# Patient Record
Sex: Female | Born: 1990 | Race: Black or African American | Hispanic: No | Marital: Single | Smoking: Former smoker
Health system: Southern US, Community
[De-identification: ages and names within clinical notes are randomized; demographics above are authoritative.]

## PROBLEM LIST (undated history)

## (undated) ENCOUNTER — Emergency Department (HOSPITAL_BASED_OUTPATIENT_CLINIC_OR_DEPARTMENT_OTHER)
Admission: EM | Discharge status: Against Medical Advice | Payer: Medicaid Other | Attending: Emergency Medicine | Admitting: Emergency Medicine

## (undated) DIAGNOSIS — D649 Anemia, unspecified: Secondary | ICD-10-CM

## (undated) DIAGNOSIS — J40 Bronchitis, not specified as acute or chronic: Secondary | ICD-10-CM

## (undated) DIAGNOSIS — F99 Mental disorder, not otherwise specified: Secondary | ICD-10-CM

## (undated) DIAGNOSIS — J45909 Unspecified asthma, uncomplicated: Secondary | ICD-10-CM

## (undated) DIAGNOSIS — E119 Type 2 diabetes mellitus without complications: Secondary | ICD-10-CM

## (undated) DIAGNOSIS — R569 Unspecified convulsions: Secondary | ICD-10-CM

## (undated) HISTORY — PX: APPENDECTOMY: SHX54

## (undated) HISTORY — DX: Mental disorder, not otherwise specified: F99

## (undated) HISTORY — DX: Unspecified asthma, uncomplicated: J45.909

## (undated) HISTORY — PX: DRAINAGE TUBE REMOVAL (ARMC HX): HXRAD1702

---
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
## 2011-01-07 ENCOUNTER — Encounter (HOSPITAL_BASED_OUTPATIENT_CLINIC_OR_DEPARTMENT_OTHER): Payer: Self-pay | Admitting: Emergency Medicine

## 2011-01-07 ENCOUNTER — Emergency Department (HOSPITAL_BASED_OUTPATIENT_CLINIC_OR_DEPARTMENT_OTHER)
Admission: EM | Admit: 2011-01-07 | Discharge: 2011-01-07 | Disposition: A | Discharge status: Home / Self Care | Payer: Medicaid Other | Attending: Emergency Medicine | Admitting: Emergency Medicine

## 2011-01-07 DIAGNOSIS — Z79899 Other long term (current) drug therapy: Secondary | ICD-10-CM | POA: Insufficient documentation

## 2011-01-07 DIAGNOSIS — M549 Dorsalgia, unspecified: Secondary | ICD-10-CM

## 2011-01-07 DIAGNOSIS — F17200 Nicotine dependence, unspecified, uncomplicated: Secondary | ICD-10-CM | POA: Insufficient documentation

## 2011-01-07 LAB — URINALYSIS, ROUTINE W REFLEX MICROSCOPIC
Glucose, UA: NEGATIVE mg/dL
Ketones, ur: NEGATIVE mg/dL
Leukocytes, UA: NEGATIVE
Nitrite: NEGATIVE
pH: 6.5 (ref 5.0–8.0)

## 2011-01-07 LAB — URINE MICROSCOPIC-ADD ON

## 2011-01-07 LAB — PREGNANCY, URINE: Preg Test, Ur: NEGATIVE

## 2011-01-07 MED ORDER — KETOROLAC TROMETHAMINE 60 MG/2ML IM SOLN
60.0000 mg | Freq: Once | INTRAMUSCULAR | Status: DC
  Filled 2011-01-07: qty 2

## 2011-01-07 NOTE — ED Notes (Signed)
Pt c/o lower back pain

## 2011-01-07 NOTE — ED Provider Notes (Signed)
History     CSN: [id number] Arrival date & time: 01/07/2011  2:50 AM  Chief Complaint  Patient presents with  . Back Pain    HPI  (Consider location/radiation/quality/duration/timing/severity/associated sxs/prior treatment)  Patient is a 20 y.o. female presenting with back pain. The history is provided by the patient.  Back Pain  This is a chronic problem. Pertinent negatives include no chest pain, no fever, no headaches, no abdominal pain and no dysuria.   lower back pain ongoing for the last few months, she has everyday, but tonight seemed to be worse. She denies any trauma. She denies any weakness or numbness to her legs. She denies any dysuria, urgency or frequency. No vaginal discharge or bleeding. No radiation of pain. Quality is burning in nature. Pain seems to be worse with movement or activity. She has not seen her doctor for this. She denies any difficulty with walking. no incontinence of bowel or urine. Severity is mild to moderate  Past Medical History  Diagnosis Date  . Seizure     History reviewed. No pertinent past surgical history.  No family history on file.  History  Substance Use Topics  . Smoking status: Current Everyday Smoker  . Smokeless tobacco: Not on file  . Alcohol Use: Yes    OB History    Grav Para Term Preterm Abortions TAB SAB Ect Mult Living                  Review of Systems  Review of Systems  Constitutional: Negative for fever and chills.  HENT: Negative for neck pain and neck stiffness.   Eyes: Negative for pain.  Respiratory: Negative for shortness of breath.   Cardiovascular: Negative for chest pain.  Gastrointestinal: Negative for abdominal pain.  Genitourinary: Negative for dysuria.  Musculoskeletal: Positive for back pain. Negative for joint swelling and gait problem.  Skin: Negative for rash.  Neurological: Negative for headaches.  All other systems reviewed and are negative.    Allergies  Review of patient's allergies  indicates not on file.  Home Medications   Current Outpatient Rx  Name Route Sig Dispense Refill  . CLONAZEPAM 0.5 MG PO TABS Oral Take 0.5 mg by mouth as needed.      Marland Kitchen DIVALPROEX SODIUM 500 MG PO TB24 Oral Take 500 mg by mouth daily.      Marland Kitchen LAMOTRIGINE 100 MG PO TABS Oral Take 100 mg by mouth daily.        Physical Exam    BP 110/73  Pulse 86  Temp(Src) 98.2 F (36.8 C) (Oral)  Resp 18  SpO2 100%  Physical Exam  Constitutional: She is oriented to person, place, and time. She appears well-developed and well-nourished.  HENT:  Head: Normocephalic and atraumatic.  Eyes: Conjunctivae and EOM are normal. Pupils are equal, round, and reactive to light.  Neck: Normal range of motion and full passive range of motion without pain. Neck supple. No thyromegaly present.       No midline tenderness, step-off or deformity.  Cardiovascular: Normal rate, regular rhythm, S1 normal, S2 normal and intact distal pulses.   Pulmonary/Chest: Effort normal and breath sounds normal.  Abdominal: Soft. Bowel sounds are normal. There is no tenderness. There is no CVA tenderness.  Musculoskeletal: Normal range of motion.       Pain localized to lower parathoracic and upper para lumbar spine. No midline tenderness. No step-offs or deformity. No erythema or warmth. No fluctuants or lesions. Lower extremities without any deficits,  normal and symmetric strengths, sensorium and deep tendon reflexes.  Neurological: She is alert and oriented to person, place, and time. She has normal strength and normal reflexes. No cranial nerve deficit or sensory deficit. She displays a negative Romberg sign. GCS eye subscore is 4. GCS verbal subscore is 5. GCS motor subscore is 6.       Normal Gait  Skin: Skin is warm and dry. No rash noted. No cyanosis. Nails show no clubbing.  Psychiatric: She has a normal mood and affect. Her speech is normal and behavior is normal.    ED Course  Procedures (including critical care  time)  Results for orders placed during the hospital encounter of 01/07/11  PREGNANCY, URINE      Component Value Range   Preg Test, Ur NEGATIVE    URINALYSIS, ROUTINE W REFLEX MICROSCOPIC      Component Value Range   Color, Urine YELLOW  YELLOW    Appearance CLOUDY (*) CLEAR    Specific Gravity, Urine 1.024  1.005 - 1.030    pH 6.5  5.0 - 8.0    Glucose, UA NEGATIVE  NEGATIVE (mg/dL)   Hgb urine dipstick LARGE (*) NEGATIVE    Bilirubin Urine NEGATIVE  NEGATIVE    Ketones, ur NEGATIVE  NEGATIVE (mg/dL)   Protein, ur NEGATIVE  NEGATIVE (mg/dL)   Urobilinogen, UA 1.0  0.0 - 1.0 (mg/dL)   Nitrite NEGATIVE  NEGATIVE    Leukocytes, UA NEGATIVE  NEGATIVE   URINE MICROSCOPIC-ADD ON      Component Value Range   Squamous Epithelial / LPF MANY (*) RARE    WBC, UA 0-2  <3 (WBC/hpf)   RBC / HPF 3-6  <3 (RBC/hpf)   Bacteria, UA FEW (*) RARE    Urine-Other MUCOUS PRESENT      Diagnosis: Musculoskeletal back pain  MDM  Patient with back pain for the last month and no deficits on exam. No findings to suggest need for emergent imaging including emergent MRI. UA obtained and reviewed as above. No UTI symptoms. Her pregnancy test negative. Patient states she has not had her Lamictal tonight and is requesting a pill at this time. Pharmacy at this facility does not carry Lamictal. Patient stable for discharge home to take her medications as prescribed. She states she does have medicines at home. Patient has scheduled followup with her physician this week in clinic. She declines pain medications in the emergency department        Sunnie Nielsen, MD 01/07/11 [phone]

## 2011-01-09 DIAGNOSIS — Z0389 Encounter for observation for other suspected diseases and conditions ruled out: Secondary | ICD-10-CM | POA: Insufficient documentation

## 2011-06-02 ENCOUNTER — Emergency Department (HOSPITAL_COMMUNITY)
Admission: EM | Admit: 2011-06-02 | Discharge: 2011-06-02 | Disposition: A | Discharge status: Home Health | Payer: Medicaid Other | Attending: Emergency Medicine | Admitting: Emergency Medicine

## 2011-06-02 ENCOUNTER — Encounter (HOSPITAL_COMMUNITY): Payer: Self-pay | Admitting: Emergency Medicine

## 2011-06-02 DIAGNOSIS — R51 Headache: Secondary | ICD-10-CM | POA: Insufficient documentation

## 2011-06-02 DIAGNOSIS — F17200 Nicotine dependence, unspecified, uncomplicated: Secondary | ICD-10-CM | POA: Insufficient documentation

## 2011-06-02 DIAGNOSIS — B9689 Other specified bacterial agents as the cause of diseases classified elsewhere: Secondary | ICD-10-CM | POA: Insufficient documentation

## 2011-06-02 DIAGNOSIS — R109 Unspecified abdominal pain: Secondary | ICD-10-CM | POA: Insufficient documentation

## 2011-06-02 DIAGNOSIS — N760 Acute vaginitis: Secondary | ICD-10-CM | POA: Insufficient documentation

## 2011-06-02 DIAGNOSIS — A499 Bacterial infection, unspecified: Secondary | ICD-10-CM | POA: Insufficient documentation

## 2011-06-02 LAB — URINALYSIS, ROUTINE W REFLEX MICROSCOPIC
Glucose, UA: NEGATIVE mg/dL
Specific Gravity, Urine: 1.02 (ref 1.005–1.030)
Urobilinogen, UA: 1 mg/dL (ref 0.0–1.0)

## 2011-06-02 LAB — BASIC METABOLIC PANEL
BUN: 8 mg/dL (ref 6–23)
CO2: 24 mEq/L (ref 19–32)
Calcium: 9.7 mg/dL (ref 8.4–10.5)
Chloride: 103 mEq/L (ref 96–112)
Creatinine, Ser: 0.74 mg/dL (ref 0.50–1.10)
Glucose, Bld: 89 mg/dL (ref 70–99)

## 2011-06-02 LAB — DIFFERENTIAL
Eosinophils Relative: 0 % (ref 0–5)
Lymphocytes Relative: 18 % (ref 12–46)
Lymphs Abs: 1.3 10*3/uL (ref 0.7–4.0)
Monocytes Absolute: 0.4 10*3/uL (ref 0.1–1.0)
Monocytes Relative: 6 % (ref 3–12)
Neutro Abs: 5.7 10*3/uL (ref 1.7–7.7)

## 2011-06-02 LAB — CBC
HCT: 39.6 % (ref 36.0–46.0)
Hemoglobin: 13.2 g/dL (ref 12.0–15.0)
MCV: 81.6 fL (ref 78.0–100.0)
WBC: 7.4 10*3/uL (ref 4.0–10.5)

## 2011-06-02 LAB — WET PREP, GENITAL: Trich, Wet Prep: NONE SEEN

## 2011-06-02 LAB — PREGNANCY, URINE: Preg Test, Ur: NEGATIVE

## 2011-06-02 LAB — URINE MICROSCOPIC-ADD ON

## 2011-06-02 MED ORDER — METRONIDAZOLE 500 MG PO TABS
500.0000 mg | ORAL_TABLET | Freq: Once | ORAL | Status: AC
  Administered 2011-06-02: 500 mg via ORAL
  Filled 2011-06-02: qty 1

## 2011-06-02 MED ORDER — HYDROCODONE-ACETAMINOPHEN 5-325 MG PO TABS: 1.0000 | ORAL_TABLET | ORAL | Status: AC | PRN

## 2011-06-02 MED ORDER — METRONIDAZOLE 500 MG PO TABS: 500.0000 mg | ORAL_TABLET | Freq: Two times a day (BID) | ORAL | Status: AC

## 2011-06-02 NOTE — ED Notes (Signed)
Saline lock #22 catheter removed from the left AC.  No charting found about this.  Catheter was intact.

## 2011-06-02 NOTE — ED Notes (Signed)
DC instructions to patient. Verbalized understanding.  Home with family.

## 2011-06-02 NOTE — ED Notes (Signed)
Pt c/o n/v with abd pain and headache since last night.

## 2011-06-02 NOTE — ED Provider Notes (Cosign Needed)
This chart was scribed for Carleene Cooper III, MD by Wallis Mart. The patient was seen in room APA11/APA11 and the patient's care was started at 5:57 PM.   CSN: [id number]  Arrival date & time 06/02/11  1455   First MD Initiated Contact with Patient 06/02/11 1745      Chief Complaint  Patient presents with  . Emesis  . Headache  . Abdominal Pain  . Nausea    (Consider location/radiation/quality/duration/timing/severity/associated sxs/prior treatment) HPI Capitola Ladson is a 21 y.o. female who presents to the Emergency Department complaining of sudden onset, persistence of constant, gradually worsening, moderate to severe LLQ abdominal pain that radiates to legs.  Pt c/o associated vomiting, nausea, tinnitus, subjective fever, earache, sore throat, nausea.  Pt states vomiting started last night and has been vomiting all day. Pt denies diarrhea. There are no other associated symptoms and no other alleviating or aggravating factors.    Pt has had similar problems before, states she was dx w/ virus.  Pt has a seizure disorder and been out of her medication since October. Pt denies alcohol and drug use, but does smoke cigarettes.    PCP: Floydene Flock  Past Medical History  Diagnosis Date  . Seizure     History reviewed. No pertinent past surgical history.  History reviewed. No pertinent family history.  History  Substance Use Topics  . Smoking status: Current Everyday Smoker  . Smokeless tobacco: Not on file  . Alcohol Use: Yes    OB History    Grav Para Term Preterm Abortions TAB SAB Ect Mult Living                  Review of Systems  10 Systems reviewed and are negative for acute change except as noted in the HPI.  Allergies  Review of patient's allergies indicates no known allergies.  Home Medications   Current Outpatient Rx  Name Route Sig Dispense Refill  . CLONAZEPAM 0.5 MG PO TABS Oral Take 0.5 mg by mouth as needed.      Marland Kitchen DIVALPROEX SODIUM ER 500 MG PO  TB24 Oral Take 500 mg by mouth daily.      Marland Kitchen LAMOTRIGINE 100 MG PO TABS Oral Take 100 mg by mouth daily.        BP 109/69  Pulse 110  Temp(Src) 98.4 F (36.9 C) (Oral)  Resp 22  Ht 5\' 4"  (1.626 m)  Wt 134 lb (60.782 kg)  BMI 23.00 kg/m2  SpO2 98%  Physical Exam  Nursing note and vitals reviewed. Constitutional: She is oriented to person, place, and time. She appears well-developed and well-nourished. No distress.  HENT:  Head: Normocephalic and atraumatic.  Eyes: EOM are normal. Pupils are equal, round, and reactive to light.  Neck: Normal range of motion. Neck supple. No tracheal deviation present.  Cardiovascular: Normal rate, regular rhythm and normal heart sounds.   Pulmonary/Chest: Effort normal and breath sounds normal. No respiratory distress.  Abdominal: Soft. Bowel sounds are normal. She exhibits no distension. There is tenderness.       llq tenderness to palpation  Genitourinary:       vaginal bleeding, mild uterine tenderness on bimanual exam, no uterine mass, specimens obtaned for GC and chlamydia  Musculoskeletal: Normal range of motion. She exhibits no edema.  Neurological: She is alert and oriented to person, place, and time. No sensory deficit.  Skin: Skin is warm and dry.  Psychiatric: She has a normal mood and affect. Her behavior is normal.  ED Course  Procedures (including critical care time) DIAGNOSTIC STUDIES: Oxygen Saturation is 100% on room air, normal by my interpretation.    COORDINATION OF CARE:  7:01 PM: Pelvic exam performed w/ witness in room   Labs Reviewed  URINALYSIS, ROUTINE W REFLEX MICROSCOPIC - Abnormal; Notable for the following:    Color, Urine AMBER (*) BIOCHEMICALS MAY BE AFFECTED BY COLOR   Hgb urine dipstick LARGE (*)    Bilirubin Urine SMALL (*)    Ketones, ur 15 (*)    Protein, ur 30 (*)    All other components within normal limits  WET PREP, GENITAL - Abnormal; Notable for the following:    Clue Cells Wet Prep HPF POC  FEW (*)    WBC, Wet Prep HPF POC FEW (*)    All other components within normal limits  URINE MICROSCOPIC-ADD ON - Abnormal; Notable for the following:    Squamous Epithelial / LPF FEW (*)    Bacteria, UA FEW (*)    All other components within normal limits  PREGNANCY, URINE  CBC  DIFFERENTIAL  BASIC METABOLIC PANEL  GC/CHLAMYDIA PROBE AMP, GENITAL   No results found.   1. Bacterial vaginosis      I personally performed the services described in this documentation, which was scribed in my presence. The recorded information has been reviewed and considered.  Osvaldo Human, M.D.   Carleene Cooper III, MD 06/03/11 1101

## 2011-06-04 LAB — GC/CHLAMYDIA PROBE AMP, GENITAL: GC Probe Amp, Genital: NEGATIVE

## 2012-05-14 ENCOUNTER — Encounter (HOSPITAL_COMMUNITY): Payer: Self-pay | Admitting: Cardiology

## 2012-05-14 ENCOUNTER — Emergency Department (HOSPITAL_COMMUNITY)
Admission: EM | Admit: 2012-05-14 | Discharge: 2012-05-14 | Disposition: A | Discharge status: Home / Self Care | Payer: Medicaid Other | Attending: Emergency Medicine | Admitting: Emergency Medicine

## 2012-05-14 DIAGNOSIS — Z8669 Personal history of other diseases of the nervous system and sense organs: Secondary | ICD-10-CM | POA: Insufficient documentation

## 2012-05-14 DIAGNOSIS — F17200 Nicotine dependence, unspecified, uncomplicated: Secondary | ICD-10-CM | POA: Insufficient documentation

## 2012-05-14 DIAGNOSIS — N898 Other specified noninflammatory disorders of vagina: Secondary | ICD-10-CM | POA: Insufficient documentation

## 2012-05-14 DIAGNOSIS — N939 Abnormal uterine and vaginal bleeding, unspecified: Secondary | ICD-10-CM

## 2012-05-14 DIAGNOSIS — Z3202 Encounter for pregnancy test, result negative: Secondary | ICD-10-CM | POA: Insufficient documentation

## 2012-05-14 LAB — URINE MICROSCOPIC-ADD ON

## 2012-05-14 LAB — URINALYSIS, ROUTINE W REFLEX MICROSCOPIC
Bilirubin Urine: NEGATIVE
Glucose, UA: NEGATIVE mg/dL
Protein, ur: NEGATIVE mg/dL

## 2012-05-14 LAB — POCT PREGNANCY, URINE: Preg Test, Ur: NEGATIVE

## 2012-05-14 NOTE — ED Provider Notes (Signed)
History     CSN: [id number]  Arrival date & time 05/14/12  1426   First MD Initiated Contact with Patient 05/14/12 1514      Chief Complaint  Patient presents with  . Possible Pregnancy    (Consider location/radiation/quality/duration/timing/severity/associated sxs/prior treatment) Patient is a 22 y.o. female presenting with vaginal bleeding. The history is provided by the patient (pt states she has had a small amount of bleeding). No language interpreter was used.  Vaginal Bleeding This is a new problem. The current episode started more than 2 days ago. The problem occurs rarely. The problem has not changed since onset.Pertinent negatives include no chest pain, no abdominal pain and no headaches. Nothing aggravates the symptoms. Nothing relieves the symptoms.    Past Medical History  Diagnosis Date  . Seizure     History reviewed. No pertinent past surgical history.  History reviewed. No pertinent family history.  History  Substance Use Topics  . Smoking status: Current Every Day Smoker  . Smokeless tobacco: Not on file  . Alcohol Use: Yes    OB History    Grav Para Term Preterm Abortions TAB SAB Ect Mult Living                  Review of Systems  Constitutional: Negative for fatigue.  HENT: Negative for congestion, sinus pressure and ear discharge.   Eyes: Negative for discharge.  Respiratory: Negative for cough.   Cardiovascular: Negative for chest pain.  Gastrointestinal: Negative for abdominal pain and diarrhea.  Genitourinary: Positive for vaginal bleeding. Negative for frequency and hematuria.  Musculoskeletal: Negative for back pain.  Skin: Negative for rash.  Neurological: Negative for seizures and headaches.  Hematological: Negative.   Psychiatric/Behavioral: Negative for hallucinations.    Allergies  Review of patient's allergies indicates no known allergies.  Home Medications  No current outpatient prescriptions on file.  BP 108/68  Pulse 89   Temp 98.3 F (36.8 C) (Oral)  Resp 15  SpO2 100%  LMP 03/08/2012  Physical Exam  Constitutional: She is oriented to person, place, and time. She appears well-developed.  HENT:  Head: Normocephalic and atraumatic.  Eyes: Conjunctivae normal and EOM are normal. No scleral icterus.  Neck: Neck supple. No thyromegaly present.  Cardiovascular: Normal rate and regular rhythm.  Exam reveals no gallop and no friction rub.   No murmur heard. Pulmonary/Chest: No stridor. She has no wheezes. She has no rales. She exhibits no tenderness.  Abdominal: She exhibits no distension. There is no tenderness. There is no rebound.  Musculoskeletal: Normal range of motion. She exhibits no edema.  Lymphadenopathy:    She has no cervical adenopathy.  Neurological: She is oriented to person, place, and time. Coordination normal.  Skin: No rash noted. No erythema.  Psychiatric: She has a normal mood and affect. Her behavior is normal.    ED Course  Procedures (including critical care time)   Labs Reviewed  POCT PREGNANCY, URINE  URINALYSIS, ROUTINE W REFLEX MICROSCOPIC   No results found.   1. Vaginal bleeding       MDM          Benny Lennert, MD 05/14/12 [phone]

## 2012-05-14 NOTE — ED Notes (Signed)
Pt presents for evaluation of positive pregnancy test, pt took 2 pregnancy tests last week that were positive and a few days later noticed bright red vaginal spotting.  Pt LMP was in November, denies any abdominal pain or difficulty urinating.

## 2012-05-14 NOTE — ED Notes (Signed)
Pt reports she took 2 pregnancy test about a week ago and was positive. States she became concerned today when she saw some bleeding. Denies any pain.

## 2012-08-17 ENCOUNTER — Emergency Department (HOSPITAL_COMMUNITY)
Admission: EM | Admit: 2012-08-17 | Discharge: 2012-08-17 | Disposition: A | Discharge status: Home / Self Care | Payer: Medicaid Other | Attending: Emergency Medicine | Admitting: Emergency Medicine

## 2012-08-17 ENCOUNTER — Encounter (HOSPITAL_COMMUNITY): Payer: Self-pay | Admitting: Adult Health

## 2012-08-17 DIAGNOSIS — R110 Nausea: Secondary | ICD-10-CM | POA: Insufficient documentation

## 2012-08-17 DIAGNOSIS — Z8669 Personal history of other diseases of the nervous system and sense organs: Secondary | ICD-10-CM | POA: Insufficient documentation

## 2012-08-17 DIAGNOSIS — Z3202 Encounter for pregnancy test, result negative: Secondary | ICD-10-CM | POA: Insufficient documentation

## 2012-08-17 DIAGNOSIS — N912 Amenorrhea, unspecified: Secondary | ICD-10-CM | POA: Insufficient documentation

## 2012-08-17 DIAGNOSIS — F17200 Nicotine dependence, unspecified, uncomplicated: Secondary | ICD-10-CM | POA: Insufficient documentation

## 2012-08-17 LAB — POCT PREGNANCY, URINE: Preg Test, Ur: NEGATIVE

## 2012-08-17 NOTE — ED Provider Notes (Signed)
History     CSN: [id number]  Arrival date & time 08/17/12  1752   First MD Initiated Contact with Patient 08/17/12 1841      Chief Complaint  Patient presents with  . Possible Pregnancy    (Consider location/radiation/quality/duration/timing/severity/associated sxs/prior treatment) HPI Jamie [name] is a 22 y.o. female who presents to ED with complaint of positive pregnancy test at home and wanting confirmation. States no period since January. States normally has regular periods. Also reports feeling nauseated from time to time. States also feels like her abdomen is getting larger and breasts are tender and enlarged. States she is sexually active. Denies any other complaints. States called a gyn but they told her she has to have a medicaid referral. States could not get one today. Pt denies urinary symptoms, no vaginal complaints, no abdominal pain.   Past Medical History  Diagnosis Date  . Seizure     History reviewed. No pertinent past surgical history.  History reviewed. No pertinent family history.  History  Substance Use Topics  . Smoking status: Current Every Day Smoker  . Smokeless tobacco: Not on file  . Alcohol Use: Yes    OB History   Grav Para Term Preterm Abortions TAB SAB Ect Mult Living                  Review of Systems  Constitutional: Negative for fever and chills.  HENT: Negative for neck pain and neck stiffness.   Respiratory: Negative.   Cardiovascular: Negative.   Gastrointestinal: Positive for nausea. Negative for vomiting, abdominal pain, diarrhea and constipation.  Genitourinary: Negative for dysuria, urgency, flank pain, vaginal bleeding, vaginal discharge, vaginal pain and pelvic pain.  Skin: Negative.   Neurological: Negative for weakness and numbness.    Allergies  Review of patient's allergies indicates no known allergies.  Home Medications  No current outpatient prescriptions on file.  BP 112/69  Pulse 89  Temp(Src) 98.3 F  (36.8 C) (Oral)  Resp 16  SpO2 100%  Physical Exam  Nursing note and vitals reviewed. Constitutional: She is oriented to person, place, and time. She appears well-developed and well-nourished. No distress.  Neck: Neck supple.  Cardiovascular: Normal rate, regular rhythm and normal heart sounds.   Pulmonary/Chest: Effort normal and breath sounds normal. No respiratory distress. She has no wheezes. She has no rales.  Abdominal: Soft. Bowel sounds are normal. She exhibits no distension. There is no tenderness. There is no rebound.  Genitourinary:  Breasts normal appearing  Neurological: She is alert and oriented to person, place, and time.  Skin: Skin is warm and dry.    ED Course  Procedures (including critical care time)  Labs Reviewed  POCT PREGNANCY, URINE   No results found.   1. Amenorrhea       MDM  Pregnancy test negative. Pt in no distress. Vs normal. Explained that she could have had false positive at home or false negative here. Either way, re take pregnancy test at home in 1 week. Follow up with GYN for ammenorrhea and if pregnant.    Filed Vitals:   08/17/12 1759  BP: 112/69  Pulse: 89  Temp: 98.3 F (36.8 C)  TempSrc: Oral  Resp: 16  SpO2: 100%        Pilar Westergaard A Mouhamed Glassco, PA-C 08/18/12 0007

## 2012-08-17 NOTE — ED Notes (Addendum)
Pt reports not having period since Jan. Took home pregnancy test and it is positive. Requesting confirmation. Denies abdominal pain, reports breast tenderness. Denies vaginal bleeding. Reports nausea and emesis. Pt c/o intermittent headaches.

## 2012-08-18 NOTE — ED Provider Notes (Signed)
Medical screening examination/treatment/procedure(s) were performed by non-physician practitioner and as supervising physician I was immediately available for consultation/collaboration.  Flint Melter, MD 08/18/12 [phone]

## 2012-12-28 ENCOUNTER — Emergency Department (HOSPITAL_COMMUNITY): Payer: Medicaid Other

## 2012-12-28 ENCOUNTER — Encounter (HOSPITAL_COMMUNITY): Payer: Self-pay | Admitting: *Deleted

## 2012-12-28 ENCOUNTER — Emergency Department (HOSPITAL_COMMUNITY)
Admission: EM | Admit: 2012-12-28 | Discharge: 2012-12-28 | Disposition: A | Discharge status: Home / Self Care | Payer: Medicaid Other | Attending: Emergency Medicine | Admitting: Emergency Medicine

## 2012-12-28 DIAGNOSIS — F17200 Nicotine dependence, unspecified, uncomplicated: Secondary | ICD-10-CM | POA: Insufficient documentation

## 2012-12-28 DIAGNOSIS — Z3202 Encounter for pregnancy test, result negative: Secondary | ICD-10-CM | POA: Insufficient documentation

## 2012-12-28 DIAGNOSIS — Z8669 Personal history of other diseases of the nervous system and sense organs: Secondary | ICD-10-CM | POA: Insufficient documentation

## 2012-12-28 DIAGNOSIS — R112 Nausea with vomiting, unspecified: Secondary | ICD-10-CM | POA: Insufficient documentation

## 2012-12-28 DIAGNOSIS — N852 Hypertrophy of uterus: Secondary | ICD-10-CM | POA: Insufficient documentation

## 2012-12-28 LAB — CBC WITH DIFFERENTIAL/PLATELET
Basophils Absolute: 0 10*3/uL (ref 0.0–0.1)
Basophils Relative: 1 % (ref 0–1)
Eosinophils Absolute: 0.1 10*3/uL (ref 0.0–0.7)
Hemoglobin: 10.5 g/dL — ABNORMAL LOW (ref 12.0–15.0)
MCH: 25 pg — ABNORMAL LOW (ref 26.0–34.0)
MCHC: 32.6 g/dL (ref 30.0–36.0)
Monocytes Relative: 10 % (ref 3–12)
Neutrophils Relative %: 29 % — ABNORMAL LOW (ref 43–77)
RDW: 13.7 % (ref 11.5–15.5)

## 2012-12-28 LAB — BASIC METABOLIC PANEL
BUN: 6 mg/dL (ref 6–23)
Creatinine, Ser: 0.77 mg/dL (ref 0.50–1.10)
GFR calc Af Amer: >90 mL/min (ref >90)
GFR calc non Af Amer: >90 mL/min (ref >90)
Potassium: 3.2 mEq/L — ABNORMAL LOW (ref 3.5–5.1)

## 2012-12-28 LAB — URINALYSIS, ROUTINE W REFLEX MICROSCOPIC
Glucose, UA: NEGATIVE mg/dL
Ketones, ur: 15 mg/dL — AB
Leukocytes, UA: NEGATIVE
Nitrite: NEGATIVE
Protein, ur: NEGATIVE mg/dL
Urobilinogen, UA: 1 mg/dL (ref 0.0–1.0)

## 2012-12-28 LAB — HCG, QUANTITATIVE, PREGNANCY: hCG, Beta Chain, Quant, S: <1 m[IU]/mL (ref <5)

## 2012-12-28 MED ORDER — ONDANSETRON HCL 4 MG PO TABS: 4.0000 mg | ORAL_TABLET | Freq: Four times a day (QID) | ORAL | Status: DC

## 2012-12-28 MED ORDER — ONDANSETRON 4 MG PO TBDP
4.0000 mg | ORAL_TABLET | Freq: Once | ORAL | Status: AC
  Administered 2012-12-28: 4 mg via ORAL
  Filled 2012-12-28: qty 1

## 2012-12-28 NOTE — ED Provider Notes (Signed)
CSN: [id number]     Arrival date & time 12/28/12  1018 History   First MD Initiated Contact with Patient 12/28/12 1113     Chief Complaint  Patient presents with  . Emesis   (Consider location/radiation/quality/duration/timing/severity/associated sxs/prior Treatment) HPI  Jamie [name] is a 22 y.o.female without any significant PMH presents to the ER with complaints of nausea, vomiting and positive pregnancy test. She truly can not remember when her last period may have been. Her mother is unsure as well. She is unable to fit into her clothes now and is having difficulty sleeping. The patients mother says that she has not been keeping any food or fluids down for a week now. Pt denies having any vaginal bleeding or discharge, denies abdominal pains or cramping. No dysuria. She does not feel weak. Her vss.   Past Medical History  Diagnosis Date  . Seizure    History reviewed. No pertinent past surgical history. History reviewed. No pertinent family history. History  Substance Use Topics  . Smoking status: Current Every Day Smoker  . Smokeless tobacco: Not on file  . Alcohol Use: Yes   OB History   Grav Para Term Preterm Abortions TAB SAB Ect Mult Living                 Review of Systems The patient denies anorexia, fever, weight loss,, vision loss, decreased hearing, hoarseness, chest pain, syncope, dyspnea on exertion, peripheral edema, balance deficits, hemoptysis, abdominal pain, melena, hematochezia, severe indigestion/heartburn, hematuria, incontinence, genital sores, muscle weakness, suspicious skin lesions, transient blindness, difficulty walking, depression, unusual weight change, abnormal bleeding, enlarged lymph nodes, angioedema, and breast masses.  Allergies  Review of patient's allergies indicates no known allergies.  Home Medications   Current Outpatient Rx  Name  Route  Sig  Dispense  Refill  . acetaminophen (TYLENOL) 325 MG tablet   Oral   Take 650 mg by  mouth every 6 (six) hours as needed for pain (headache).         . ondansetron (ZOFRAN) 4 MG tablet   Oral   Take 1 tablet (4 mg total) by mouth every 6 (six) hours.   12 tablet   0    BP 111/68  Pulse 71  Temp(Src) 98.7 F (37.1 C) (Oral)  Resp 20  SpO2 100% Physical Exam  Nursing note and vitals reviewed. Constitutional: She appears well-developed and well-nourished. No distress.  HENT:  Head: Normocephalic and atraumatic.  Eyes: Pupils are equal, round, and reactive to light.  Neck: Normal range of motion. Neck supple.  Cardiovascular: Normal rate and regular rhythm.   Pulmonary/Chest: Effort normal.  Abdominal: Soft.  Genitourinary: Uterus is enlarged.  Neurological: She is alert.  Skin: Skin is warm and dry.    ED Course  Procedures (including critical care time) Labs Review Labs Reviewed  URINALYSIS, ROUTINE W REFLEX MICROSCOPIC - Abnormal; Notable for the following:    Color, Urine AMBER (*)    APPearance CLOUDY (*)    Bilirubin Urine SMALL (*)    Ketones, ur 15 (*)    All other components within normal limits  CBC WITH DIFFERENTIAL - Abnormal; Notable for the following:    Hemoglobin 10.5 (*)    HCT 32.2 (*)    MCV 76.7 (*)    MCH 25.0 (*)    Neutrophils Relative % 29 (*)    Neutro Abs 1.4 (*)    Lymphocytes Relative 59 (*)    All other components within normal limits  BASIC METABOLIC PANEL - Abnormal; Notable for the following:    Potassium 3.2 (*)    All other components within normal limits  PREGNANCY, URINE  HCG, QUANTITATIVE, PREGNANCY   Imaging Review US Pelvis Complete  12/28/2012   CLINICAL DATA:  22 year old female with abdominal pain. Patient initially thought to be pregnant, subsequent negative urine pregnancy test.  EXAM: TRANSABDOMINAL ULTRASOUND OF PELVIS  TECHNIQUE: Transabdominal ultrasound examination of the pelvis was performed including evaluation of the uterus, ovaries, adnexal regions, and pelvic cul-de-sac.  COMPARISON:   08/09/2010.  FINDINGS: Uterus:  Normal. 7.8 x 3.5 x 4.8 cm.  Endometrium: Homogeneous up to 6 mm in thickness.  Right ovary: Appears normal with multiple small follicles. 3.7 x 2.9 x 3.5 cm.  Left ovary: Appears normal with multiple small follicles. 3.8 x 2.8 x 3.2 cm.  Other findings:  No free fluid  IMPRESSION: Stable and negative sonographic appearance of the pelvis.   Electronically Signed   By: Augusto Gamble M.D.   On: 12/28/2012 13:08    MDM   1. Nausea and vomiting      Urine pregnancy is negative but [place] of uterus still obtained due to uterus being enlarged. No abnormalities were found.   Blood HCG is zero, and no electrolyte abnormalities. She was given Zofran in the ED and tolerated fluids and solids without vomiting. NO abdominal pain and no vaginal bleeding.   Pt  Can follow-up with PCP or return to the ED as needed.  22 y.o.Jamie [name]'s evaluation in the Emergency Department is complete. It has been determined that no acute conditions requiring further emergency intervention are present at this time. The patient/guardian have been advised of the diagnosis and plan. We have discussed signs and symptoms that warrant return to the ED, such as changes or worsening in symptoms.  Vital signs are stable at discharge. Filed Vitals:   12/28/12 1028  BP: 111/68  Pulse: 71  Temp: 98.7 F (37.1 C)  Resp: 20    Patient/guardian has voiced understanding and agreed to follow-up with the PCP or specialist.    Dorthula Matas, PA-C 12/28/12 1451

## 2012-12-28 NOTE — ED Notes (Addendum)
Pt reports having n/v x 7 days. Took pregnancy test yesterday and it was +. Also having difficulty sleeping. No acute distress noted at triage. Pt has no idea when last period was.

## 2012-12-28 NOTE — ED Provider Notes (Signed)
Medical screening examination/treatment/procedure(s) were performed by non-physician practitioner and as supervising physician I was immediately available for consultation/collaboration.   Niala Stcharles, MD 12/28/12 1632 

## 2014-01-13 ENCOUNTER — Inpatient Hospital Stay (HOSPITAL_COMMUNITY): Payer: Medicaid Other

## 2014-01-13 ENCOUNTER — Encounter (HOSPITAL_COMMUNITY): Payer: Self-pay | Admitting: General Surgery

## 2014-01-13 ENCOUNTER — Inpatient Hospital Stay (HOSPITAL_COMMUNITY)
Admission: AD | Admit: 2014-01-13 | Discharge: 2014-01-17 | DRG: 871 | Disposition: A | Discharge status: Home Health | Payer: Medicaid Other | Source: Other Acute Inpatient Hospital | Attending: General Surgery | Admitting: General Surgery

## 2014-01-13 DIAGNOSIS — J9811 Atelectasis: Secondary | ICD-10-CM | POA: Diagnosis present

## 2014-01-13 DIAGNOSIS — I313 Pericardial effusion (noninflammatory): Secondary | ICD-10-CM | POA: Diagnosis present

## 2014-01-13 DIAGNOSIS — D649 Anemia, unspecified: Secondary | ICD-10-CM | POA: Diagnosis present

## 2014-01-13 DIAGNOSIS — J90 Pleural effusion, not elsewhere classified: Secondary | ICD-10-CM | POA: Diagnosis present

## 2014-01-13 DIAGNOSIS — K353 Acute appendicitis with localized peritonitis: Secondary | ICD-10-CM | POA: Diagnosis present

## 2014-01-13 DIAGNOSIS — R6520 Severe sepsis without septic shock: Secondary | ICD-10-CM | POA: Diagnosis present

## 2014-01-13 DIAGNOSIS — R0902 Hypoxemia: Secondary | ICD-10-CM | POA: Diagnosis present

## 2014-01-13 DIAGNOSIS — A419 Sepsis, unspecified organism: Principal | ICD-10-CM | POA: Diagnosis present

## 2014-01-13 DIAGNOSIS — K37 Unspecified appendicitis: Secondary | ICD-10-CM | POA: Diagnosis present

## 2014-01-13 DIAGNOSIS — G40909 Epilepsy, unspecified, not intractable, without status epilepticus: Secondary | ICD-10-CM | POA: Diagnosis present

## 2014-01-13 DIAGNOSIS — J189 Pneumonia, unspecified organism: Secondary | ICD-10-CM | POA: Diagnosis present

## 2014-01-13 DIAGNOSIS — F17210 Nicotine dependence, cigarettes, uncomplicated: Secondary | ICD-10-CM | POA: Diagnosis present

## 2014-01-13 DIAGNOSIS — K3580 Unspecified acute appendicitis: Secondary | ICD-10-CM

## 2014-01-13 DIAGNOSIS — K651 Peritoneal abscess: Secondary | ICD-10-CM

## 2014-01-13 DIAGNOSIS — K3533 Acute appendicitis with perforation and localized peritonitis, with abscess: Secondary | ICD-10-CM

## 2014-01-13 DIAGNOSIS — F1290 Cannabis use, unspecified, uncomplicated: Secondary | ICD-10-CM | POA: Diagnosis present

## 2014-01-13 LAB — COMPREHENSIVE METABOLIC PANEL
ALT: 6 U/L (ref 0–35)
ANION GAP: 11 (ref 5–15)
AST: 12 U/L (ref 0–37)
Albumin: 1.7 g/dL — ABNORMAL LOW (ref 3.5–5.2)
Alkaline Phosphatase: 101 U/L (ref 39–117)
BUN: 4 mg/dL — AB (ref 6–23)
CO2: 28 mEq/L (ref 19–32)
Calcium: 7.9 mg/dL — ABNORMAL LOW (ref 8.4–10.5)
Chloride: 102 mEq/L (ref 96–112)
Creatinine, Ser: 0.84 mg/dL (ref 0.50–1.10)
GFR calc Af Amer: >90 mL/min (ref >90)
GFR calc non Af Amer: >90 mL/min (ref >90)
Glucose, Bld: 81 mg/dL (ref 70–99)
POTASSIUM: 3.7 meq/L (ref 3.7–5.3)
Sodium: 141 mEq/L (ref 137–147)
Total Bilirubin: 0.3 mg/dL (ref 0.3–1.2)
Total Protein: 6.7 g/dL (ref 6.0–8.3)

## 2014-01-13 LAB — CBC WITH DIFFERENTIAL/PLATELET
BASOS PCT: 0 % (ref 0–1)
Basophils Absolute: 0 10*3/uL (ref 0.0–0.1)
EOS ABS: 0 10*3/uL (ref 0.0–0.7)
Eosinophils Relative: 0 % (ref 0–5)
HCT: 26.5 % — ABNORMAL LOW (ref 36.0–46.0)
Hemoglobin: 8.1 g/dL — ABNORMAL LOW (ref 12.0–15.0)
Lymphocytes Relative: 18 % (ref 12–46)
Lymphs Abs: 3.4 10*3/uL (ref 0.7–4.0)
MCH: 23.1 pg — ABNORMAL LOW (ref 26.0–34.0)
MCHC: 30.6 g/dL (ref 30.0–36.0)
MCV: 75.5 fL — ABNORMAL LOW (ref 78.0–100.0)
MONO ABS: 0.9 10*3/uL (ref 0.1–1.0)
Monocytes Relative: 5 % (ref 3–12)
Neutro Abs: 14.6 10*3/uL — ABNORMAL HIGH (ref 1.7–7.7)
Neutrophils Relative %: 77 % (ref 43–77)
PLATELETS: 435 10*3/uL — AB (ref 150–400)
RBC: 3.51 MIL/uL — ABNORMAL LOW (ref 3.87–5.11)
RDW: 16.7 % — ABNORMAL HIGH (ref 11.5–15.5)
WBC: 18.9 10*3/uL — ABNORMAL HIGH (ref 4.0–10.5)

## 2014-01-13 LAB — GLUCOSE, CAPILLARY: Glucose-Capillary: 87 mg/dL (ref 70–99)

## 2014-01-13 LAB — MRSA PCR SCREENING: MRSA BY PCR: NEGATIVE

## 2014-01-13 LAB — LACTIC ACID, PLASMA: Lactic Acid, Venous: 0.8 mmol/L (ref 0.5–2.2)

## 2014-01-13 MED ORDER — CETYLPYRIDINIUM CHLORIDE 0.05 % MT LIQD
7.0000 mL | Freq: Two times a day (BID) | OROMUCOSAL | Status: DC
  Administered 2014-01-15 – 2014-01-16 (×2): 7 mL via OROMUCOSAL

## 2014-01-13 MED ORDER — IOHEXOL 300 MG/ML  SOLN
80.0000 mL | Freq: Once | INTRAMUSCULAR | Status: AC | PRN
  Administered 2014-01-13: 80 mL via INTRAVENOUS

## 2014-01-13 MED ORDER — HEPARIN SODIUM (PORCINE) 5000 UNIT/ML IJ SOLN
5000.0000 [IU] | Freq: Three times a day (TID) | INTRAMUSCULAR | Status: DC
Start: 2014-01-13 — End: 2014-01-13

## 2014-01-13 MED ORDER — PIPERACILLIN-TAZOBACTAM 3.375 G IVPB
3.3750 g | Freq: Three times a day (TID) | INTRAVENOUS | Status: DC
  Administered 2014-01-13 – 2014-01-17 (×11): 3.375 g via INTRAVENOUS
  Filled 2014-01-13 (×13): qty 50

## 2014-01-13 MED ORDER — FENTANYL CITRATE 0.05 MG/ML IJ SOLN
25.0000 ug | INTRAMUSCULAR | Status: DC | PRN
  Administered 2014-01-14 – 2014-01-15 (×6): 100 ug via INTRAVENOUS
  Administered 2014-01-15 (×4): 50 ug via INTRAVENOUS
  Administered 2014-01-15: 100 ug via INTRAVENOUS
  Administered 2014-01-16: 75 ug via INTRAVENOUS
  Administered 2014-01-17: 25 ug via INTRAVENOUS
  Filled 2014-01-13 (×13): qty 2

## 2014-01-13 MED ORDER — KCL IN DEXTROSE-NACL 40-5-0.45 MEQ/L-%-% IV SOLN
INTRAVENOUS | Status: DC
  Administered 2014-01-13 – 2014-01-14 (×2): 100 mL/h via INTRAVENOUS
  Administered 2014-01-14 – 2014-01-17 (×3): via INTRAVENOUS
  Filled 2014-01-13 (×10): qty 1000

## 2014-01-13 MED ORDER — ENOXAPARIN SODIUM 40 MG/0.4ML ~~LOC~~ SOLN
40.0000 mg | SUBCUTANEOUS | Status: DC
  Administered 2014-01-13 – 2014-01-15 (×3): 40 mg via SUBCUTANEOUS
  Filled 2014-01-13 (×5): qty 0.4

## 2014-01-13 MED ORDER — ASPIRIN 300 MG RE SUPP: 300.0000 mg | RECTAL | Status: DC

## 2014-01-13 MED ORDER — WHITE PETROLATUM GEL
Status: AC
  Administered 2014-01-13: 20:00:00
  Filled 2014-01-13: qty 5

## 2014-01-13 MED ORDER — ASPIRIN 81 MG PO CHEW: 324.0000 mg | CHEWABLE_TABLET | ORAL | Status: DC

## 2014-01-13 MED ORDER — SODIUM CHLORIDE 0.9 % IV SOLN
250.0000 mL | INTRAVENOUS | Status: DC | PRN
Start: 2014-01-13 — End: 2014-01-17

## 2014-01-13 NOTE — Progress Notes (Addendum)
ANTIBIOTIC CONSULT NOTE - INITIAL  Pharmacy Consult for Zosyn Indication: RLQ abscess, severe sepsis  No Known Allergies  Patient Measurements: Height: 5' 0.5" (153.7 cm) Weight: 146 lb 13.2 oz (66.6 kg) IBW/kg (Calculated) : 46.65   Vital Signs: Temp: 99.5 F (37.5 C) (10/02 1600) Temp Source: Oral (10/02 1600) BP: 108/76 mmHg (10/02 1900) Pulse Rate: 103 (10/02 1900) Intake/Output from previous day:   Intake/Output from this shift:    Labs: No results found for this basename: WBC, HGB, PLT, LABCREA, CREATININE,  in the last 72 hours Estimated Creatinine Clearance: 95.3 ml/min (by C-G formula based on Cr of 0.77). No results found for this basename: VANCOTROUGH, Corlis Leak, VANCORANDOM, [place], GENTPEAK, GENTRANDOM, TOBRATROUGH, TOBRAPEAK, TOBRARND, AMIKACINPEAK, AMIKACINTROU, AMIKACIN,  in the last 72 hours   Microbiology: Recent Results (from the past 720 hour(s))  MRSA PCR SCREENING     Status: None   Collection Time    01/13/14  4:03 PM      Result Value Ref Range Status   MRSA by PCR NEGATIVE  NEGATIVE Final   Comment:            The GeneXpert MRSA Assay (FDA     approved for NASAL specimens     only), is one component of a     comprehensive MRSA colonization     surveillance program. It is not     intended to diagnose MRSA     infection nor to guide or     monitor treatment for     MRSA infections.    Medical History: Past Medical History  Diagnosis Date  . Seizure   5 wks s/p C section   Medications:  Scheduled:  . antiseptic oral rinse  7 mL Mouth Rinse BID  . enoxaparin (LOVENOX) injection  40 mg Subcutaneous Q24H  . piperacillin-tazobactam (ZOSYN)  IV  3.375 g Intravenous 3 times per day  . white petrolatum       Assessment: 23 y.o female,  5 wks s/p C section, transferred from [place] with persistent SIRS, abdominal pain due to RLQ abscess.  SCR <1 on 9/29/ 15.  Admission labs are pending. CCM noted that no renal issues were  identified.  Patient was treated with Zosyn 3.375 g IV q8h since admission 01/10/14  at [facility]  Vancomycin and levofloxacin were also added. The patient was transferred to [facility] for persistent fever and abdominal pain.  CCM notes that patient is to continue on Zosyn and to reculture abscess drainage.    Plan:  Continue Zosyn 3.375 g IV q8hr (infuse each dose over 4 hours).  Follow up clinical status, renal function, culture results daily.  Admission labs are still pending- follow up and adjust Zosyn dose if needed.  Nicole Cella, RPh Clinical Pharmacist Pager: [phone] 01/13/2014,9:57 PM

## 2014-01-13 NOTE — Consult Note (Signed)
Well formed abscess likely due to perforated appendicitis. Percutaneous drain is not functional. I have discussed with Dr. Reesa Chew from interventional radiology. We'll proceed with CT scan tonight with intravenous contrast and plan for upsizing drain in the a.m. Plan was discussed in detail with the patient's parents and with Dr. Alva Garnet. Patient examined and I agree with the assessment and plan  Georganna Skeans, MD, MPH, FACS Trauma: [phone] General Surgery: [phone]  01/13/2014 5:14 PM

## 2014-01-13 NOTE — H&P (Addendum)
PULMONARY / CRITICAL CARE MEDICINE   Name: Jamie [name] MRN: [id number] DOB: 16-Jun-1990    ADMISSION DATE:  01/13/2014  REFERRING MD :  Raphael Gibney [facility])  CHIEF COMPLAINT:  Peri-appendiceal abscess  INITIAL PRESENTATION:  23 F 5 wks s/p C section, transferred from [place] with persistent SIRS, abdominal pain due to RLQ abscess. Initially admitted 9/29 to [facility] with RLQ pain and CT revealed RLQ abscess felt to be secondary to ruptured appendicitis and with appearance of being well walled-off. A percutaneous drain was placed with minimal purulent drainage. Gram's stain of drainage reportedly revealed GPCs, NOS. Pt has been treated with pip-tazo since admission to [place] was added on the basis of the Gram's stain. A CTA chest was performed during that hospitalization revealing bibasilar atx vs infiltrates, no PE. Levofloxacin was added on basis of these findings. Transferred to [facility] for persistent fever and abdominal pain.   STUDIES:  10/02 CTAP:   SIGNIFICANT EVENTS: 10/02 CCS consult 10/02 CT guided percutaneous drainage requested of IR   HISTORY OF PRESENT ILLNESS:  As above. Transferred on [place] O2 with stable BP. C/O abdominal pain only  PAST MEDICAL HISTORY :   has a past medical history of Seizure.  has no past surgical history on file. Prior to Admission medications   Medication Sig Start Date End Date Taking? Authorizing Provider  acetaminophen (TYLENOL) 325 MG tablet Take 650 mg by mouth every 6 (six) hours as needed for pain (headache).    Historical Provider, MD  ondansetron (ZOFRAN) 4 MG tablet Take 1 tablet (4 mg total) by mouth every 6 (six) hours. 12/28/12   Linus Mako, PA-C   No Known Allergies  FAMILY HISTORY:  has no family status information on file.  SOCIAL HISTORY:  reports that she has been smoking.  She does not have any smokeless tobacco history on file. She reports that she drinks alcohol. She reports that she uses illicit  drugs (Marijuana).  REVIEW OF SYSTEMS:  As per HPI  SUBJECTIVE:   VITAL SIGNS: Temp:  [99.5 F (37.5 C)] 99.5 F (37.5 C) (10/02 1600) Pulse Rate:  [111] 111 (10/02 1600) Resp:  [21] 21 (10/02 1600) BP: (105)/(61) 105/61 mmHg (10/02 1600) SpO2:  [100 %] 100 % (10/02 1600) Weight:  [66.6 kg (146 lb 13.2 oz)] 66.6 kg (146 lb 13.2 oz) (10/02 1600) HEMODYNAMICS:   VENTILATOR SETTINGS:   INTAKE / OUTPUT:  Intake/Output Summary (Last 24 hours) at 01/13/14 1739 Last data filed at 01/13/14 1600  Gross per 24 hour  Intake    100 ml  Output      0 ml  Net    100 ml    PHYSICAL EXAMINATION: General: No overt distress Neuro:  Cognition, CNs, motor, sens, DTRs all normal HEENT: NCAT, EOMI, PERRL Cardiovascular: slightly tachy, no M Lungs: Dimished in bases with mild splinting, no adventitious sounds Abdomen: distended, marked RLQ tenderness Ext: wam, no edema Skin: no lesions  LABS:  CBC No results found for this basename: WBC, HGB, HCT, PLT,  in the last 168 hours Coag's No results found for this basename: APTT, INR,  in the last 168 hours BMET No results found for this basename: NA, K, CL, CO2, BUN, CREATININE, GLUCOSE,  in the last 168 hours Electrolytes No results found for this basename: CALCIUM, MG, PHOS,  in the last 168 hours Sepsis Markers No results found for this basename: LATICACIDVEN, PROCALCITON, O2SATVEN,  in the last 168 hours ABG No results found  for this basename: PHART, PCO2ART, PO2ART,  in the last 168 hours Liver Enzymes No results found for this basename: AST, ALT, ALKPHOS, BILITOT, ALBUMIN,  in the last 168 hours Cardiac Enzymes No results found for this basename: TROPONINI, PROBNP,  in the last 168 hours Glucose  Recent Labs Lab 01/13/14 1606  GLUCAP 87    Imaging No results found.   ASSESSMENT / PLAN:  PULMONARY A: Mild hypoxemia P:   [place] O2 to maintain SpO2 > 92%  CARDIOVASCULAR A:  Mild sinus tachycardia due to SIRS P:   Monitor treat underlying causes  RENAL A:   No issues identified P:   Monitor BMET intermittently Monitor I/Os Correct electrolytes as indicated Maintenance IVFs while NPO  GASTROINTESTINAL A:   RLQ abscess P:   CCS following IR to replace or upsize drain  HEMATOLOGIC A:   No issues identified P:  DVT px: LMWH Monitor CBC intermittently Transfuse per usual ICU guidelines  INFECTIOUS A:   RLQ abscess Severe sepsis P:   Pip-tazo 9/29 >>  Reculture abscess drainage   ENDOCRINE A:   No issues P:   Monitor glu on chem panels SSI as needed for glu > 180  NEUROLOGIC A:   Abdominal pain Hx of seiaure d/o - no chronic meds Hx of bipolar d/o - no chronic meds P:   RASS goal: 0 Monitor   Family updated:  Father and mother @ bedside during history and exam. Plan explained to pt and her parents  Interdisciplinary Family Meeting v Palliative Care Meeting:    TODAY'S SUMMARY:   I have personally obtained a history, examined the patient, evaluated laboratory and imaging results, formulated the assessment and plan and placed orders. CRITICAL CARE: The patient is critically ill with multiple organ systems failure and requires high complexity decision making for assessment and support, frequent evaluation and titration of therapies, application of advanced monitoring technologies and extensive interpretation of multiple databases. Critical Care Time devoted to patient care services described in this note is  minutes.   Merton Border, MD ; [facility] [phone].  After 5:30 PM or weekends, call [phone]  Pulmonary and [place] Pager: [phone]  01/13/2014, 5:39 PM

## 2014-01-13 NOTE — Consult Note (Signed)
Jamie [name] 01/29/1991  [id number].   Primary Care MD: unknown Requesting MD: Dr. Merton Border Chief Complaint/Reason for Consult: acute perforated appendicitis HPI: This is a 23 yo black female who has a h/o bipolar disorder, seizure disorder, and some mental health issues secondary to her biologic mom doing drugs and ETOH while pregnant.  The patient is 5 weeks post partum from a c-section.  She started her period last week and also began having abdominal pain.  She thought he pain was secondary to her period.  It was on the right side.  She denies any fevers at home.  She denies nausea, vomiting.  She finally went to [facility] on Tuesday where she had a CT scan that revealed acute perforated appendicitis with a large well developed abscess.  She was admitted and started on IV abx therapy.  She had a percutaneous drain placed on Wednesday, but with minimal output since placement.  She has continued to have abdominal pain, but is improved.  She has been tachycardic with a small pericardial effusion.  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  She was also felt to possibly have bilateral pneumonia on CT scan.  She is currently on Vanc, Zosyn, and Levaquin.  Her cultures have showed gram + cocci.  Due to persistent tachycardia, fevers, and overall medical problems, [facility].  We have been asked to see her for further evaluation and management.  ROS: Please see HPI, otherwise negative.  No family history on file.  Past Medical History  Diagnosis Date  . Seizure     History reviewed. No pertinent past surgical history.  Social History:  reports that she has been smoking.  She does not have any smokeless tobacco history on file. She reports that she drinks alcohol. She reports that she uses illicit drugs (Marijuana).  Allergies: No Known Allergies  Medications Prior to  Admission  Medication Sig Dispense Refill  . acetaminophen (TYLENOL) 325 MG tablet Take 650 mg by mouth every 6 (six) hours as needed for pain (headache).      . ondansetron (ZOFRAN) 4 MG tablet Take 1 tablet (4 mg total) by mouth every 6 (six) hours.  12 tablet  0    Blood pressure 105/61, pulse 111, temperature 99.5 F (37.5 C), temperature source Oral, resp. rate 21, height 5' 0.5" (1.537 m), weight 146 lb 13.2 oz (66.6 kg), SpO2 100.00%. Physical Exam: General: pleasant, WD, WN black female who is laying in bed in NAD HEENT: head is normocephalic, atraumatic.  Sclera are noninjected.  PERRL.  Ears and nose without any masses or lesions.  Mouth is pink and moist Heart: regular rhythm, but mildly tachy.  Normal s1,s2. No obvious murmurs, gallops, or rubs noted.  Palpable radial and pedal pulses bilaterally Lungs: CTAB, but decreased breath sounds as bases, no wheezes, rhonchi, or rales noted.  Respiratory effort nonlabored Abd: soft, very tender in RLQ around her drain, some firmness to her RLQ as well, mild distention, +BS, no masses, hernias, or organomegaly, pfannenstiel scar noted.  Drain with minimal output, purulent appearing (maybe 10cc at max over 48hrs)  MS: all 4 extremities are symmetrical with no cyanosis, clubbing, or edema. Skin: warm and dry with no masses, lesions, or rashes Psych: A&Ox3 with a flat affect.    Results for orders placed during the hospital encounter of 01/13/14 (from the past 48 hour(s))  GLUCOSE, CAPILLARY     Status: None   Collection Time    01/13/14  4:06 PM      Result Value Ref Range   Glucose-Capillary 87  70 - 99 mg/dL   No results found.     Assessment/Plan 1. Acute perforated appendicitis with large intra-abdominal abscess, s/p Perc drain on 01-11-14 2. Bipolar disorder 3. Seizure disorder  Plan: 1. The patient's drain does not appear to be functioning well right now as she has only had about 10cc our in about 48 hours since drain was  placed.  We will consult IR to evaluate the drain for possible repositioning vs upsizing of the drain to get it work better.  If this will work better, then hopefully we can continue to treat the patient conservatively, however, if this does not help, then she will likely need a right colectomy given the amount of inflammation and infection in her RLQ.  Continue IV abx therapy.  Cont NPO status for now.  We will follow along.  Thank you for this consultation.  Sindi Beckworth E 01/13/2014, 4:37 PM Pager: [phone]

## 2014-01-14 DIAGNOSIS — K3580 Unspecified acute appendicitis: Secondary | ICD-10-CM

## 2014-01-14 LAB — BASIC METABOLIC PANEL
Anion gap: 9 (ref 5–15)
BUN: 4 mg/dL — ABNORMAL LOW (ref 6–23)
CHLORIDE: 104 meq/L (ref 96–112)
CO2: 30 mEq/L (ref 19–32)
CREATININE: 0.95 mg/dL (ref 0.50–1.10)
Calcium: 8.2 mg/dL — ABNORMAL LOW (ref 8.4–10.5)
GFR, EST NON AFRICAN AMERICAN: 84 mL/min — AB (ref >90)
Glucose, Bld: 92 mg/dL (ref 70–99)
Potassium: 4 mEq/L (ref 3.7–5.3)
Sodium: 143 mEq/L (ref 137–147)

## 2014-01-14 LAB — CBC
HCT: 26.8 % — ABNORMAL LOW (ref 36.0–46.0)
Hemoglobin: 8 g/dL — ABNORMAL LOW (ref 12.0–15.0)
MCH: 22.5 pg — ABNORMAL LOW (ref 26.0–34.0)
MCHC: 29.9 g/dL — AB (ref 30.0–36.0)
MCV: 75.5 fL — AB (ref 78.0–100.0)
Platelets: 410 10*3/uL — ABNORMAL HIGH (ref 150–400)
RBC: 3.55 MIL/uL — ABNORMAL LOW (ref 3.87–5.11)
RDW: 16.5 % — AB (ref 11.5–15.5)
WBC: 15.1 10*3/uL — ABNORMAL HIGH (ref 4.0–10.5)

## 2014-01-14 LAB — PHOSPHORUS: Phosphorus: 3.9 mg/dL (ref 2.3–4.6)

## 2014-01-14 LAB — MAGNESIUM: Magnesium: 1.3 mg/dL — ABNORMAL LOW (ref 1.5–2.5)

## 2014-01-14 MED ORDER — ONDANSETRON HCL 4 MG/2ML IJ SOLN
4.0000 mg | Freq: Four times a day (QID) | INTRAMUSCULAR | Status: DC | PRN
  Administered 2014-01-14 – 2014-01-16 (×6): 4 mg via INTRAVENOUS
  Filled 2014-01-14 (×5): qty 2

## 2014-01-14 MED ORDER — ONDANSETRON HCL 4 MG/2ML IJ SOLN
INTRAMUSCULAR | Status: AC
  Administered 2014-01-14: 4 mg via INTRAVENOUS
  Filled 2014-01-14: qty 2

## 2014-01-14 NOTE — Progress Notes (Signed)
PULMONARY / CRITICAL CARE MEDICINE   Name: Jamie [name] MRN: [id number] DOB: [birth year]    ADMISSION DATE:  01/13/2014  REFERRING MD :  Raphael Gibney [facility])  CHIEF COMPLAINT:  Peri-appendiceal abscess  INITIAL PRESENTATION:  23 F 5 wks s/p C section, transferred from [place] with persistent SIRS, abdominal pain due to RLQ abscess. Initially admitted 9/29 to [facility] with RLQ pain and CT revealed RLQ abscess felt to be secondary to ruptured appendicitis and with appearance of being well walled-off. A percutaneous drain was placed with minimal purulent drainage. Gram's stain of drainage reportedly revealed GPCs, NOS. Pt has been treated with pip-tazo since admission to [place] was added on the basis of the Gram's stain. A CTA chest was performed during that hospitalization revealing bibasilar atx vs infiltrates, no PE. Levofloxacin was added on basis of these findings. Transferred to [facility] for persistent fever and abdominal pain.   STUDIES:  10/02 CTAP >> RLQ abscess 6.9 cm with pig tail catheter in good position, b/l ATX and effusions  SIGNIFICANT EVENTS: 10/02 CCS consult, CT guided percutaneous drainage requested of IR  SUBJECTIVE:  Feels sleepy.  Denies chest pain, dyspnea, nausea.  Abdominal pain better  VITAL SIGNS: Temp:  [98.8 F (37.1 C)-100.4 F (38 C)] 98.8 F (37.1 C) (10/03 0400) Pulse Rate:  [85-114] 91 (10/03 0600) Resp:  [11-21] 19 (10/03 0600) BP: (93-114)/(54-78) 111/64 mmHg (10/03 0600) SpO2:  [94 %-100 %] 100 % (10/03 0600) Weight:  [146 lb 13.2 oz (66.6 kg)] 146 lb 13.2 oz (66.6 kg) (10/02 1600) INTAKE / OUTPUT:  Intake/Output Summary (Last 24 hours) at 01/14/14 0720 Last data filed at 01/14/14 0630  Gross per 24 hour  Intake 1362.5 ml  Output   1250 ml  Net  112.5 ml    PHYSICAL EXAMINATION: General: no distress Neuro: normal strength HEENT: no sinus tenderness Cardiovascular: regular, no murmur Lungs: no wheeze Abdomen: soft,  mild tenderness Ext: no edema Skin: no rashes  LABS:  CBC  Recent Labs Lab 01/13/14 2157 01/14/14 0256  WBC 18.9* 15.1*  HGB 8.1* 8.0*  HCT 26.5* 26.8*  PLT 435* 410*   BMET  Recent Labs Lab 01/13/14 2157 01/14/14 0256  NA 141 143  K 3.7 4.0  CL 102 104  CO2 28 30  BUN 4* 4*  CREATININE 0.84 0.95  GLUCOSE 81 92   Electrolytes  Recent Labs Lab 01/13/14 2157 01/14/14 0256  CALCIUM 7.9* 8.2*  MG  --  1.3*  PHOS  --  3.9   Sepsis Markers  Recent Labs Lab 01/13/14 2157  LATICACIDVEN 0.8   Liver Enzymes  Recent Labs Lab 01/13/14 2157  AST 12  ALT 6  ALKPHOS 101  BILITOT 0.3  ALBUMIN 1.7*   Glucose  Recent Labs Lab 01/13/14 1606  GLUCAP 87    Imaging Ct Abdomen Pelvis W Contrast  01/13/2014   CLINICAL DATA:  Recent history of ruptured appendicitis with abscess and subsequent placement of pigtail drainage catheter.  EXAM: CT ABDOMEN AND PELVIS WITH CONTRAST  TECHNIQUE: Multidetector CT imaging of the abdomen and pelvis was performed using the standard protocol following bolus administration of intravenous contrast.  CONTRAST:  74mL OMNIPAQUE IOHEXOL 300 MG/ML  SOLN  COMPARISON:  01/10/2014 and 01/11/2014  FINDINGS: Lung bases demonstrate worsening moderate bibasilar atelectasis and minimal lingular atelectasis with tiny amount of bilateral pleural fluid.  Abdominal images demonstrate the liver, spleen, pancreas, gallbladder and adrenal glands to be within normal. Kidneys normal in size without  hydronephrosis. There are changes bilaterally of mild peripheral cortical calcification suggesting nephrocalcinosis which is a new finding.  There is evidence of patient's pigtail drainage catheter within the known right lower quadrant abscess. The catheter appears in adequate position within the abscess. The abscess is unchanged in size measuring 6.9 x 8.2 cm. There is no free peritoneal air. No changes several small mesenteric lymph nodes in the right lower  quadrant. There is slight worsening of a fluid over the right flank subcutaneous tissues.  Pelvic images again demonstrate a mild amount of free fluid without significant change. Remainder the exam is unchanged.  IMPRESSION: No significant change in patient's right lower quadrant abscess measuring 6.9 x 0.2 cm as the pigtail drainage catheter appears in adequate position within the abscess cavity. Stable mild associated free fluid in the lower abdomen/pelvis.  Worsening bibasilar atelectasis with tiny amount of bilateral pleural fluid.  New finding of mild bilateral nephrocalcinosis.  Worsening subcutaneous fluid over the right flank.   Electronically Signed   By: Marin Olp M.D.   On: 01/13/2014 21:36   Dg Chest Port 1 View  01/13/2014   CLINICAL DATA:  Shortness of breath.  Tobacco use.  EXAM: PORTABLE CHEST - 1 VIEW  COMPARISON:  01/13/2014 at 912 00 a.m.  FINDINGS: Low lung volumes are present, causing crowding of the pulmonary vasculature. Stably enlarged cardiopericardial silhouette. Stable scarring or atelectasis along the minor fissure.  Slightly improved airspace opacity at the right lung base. Stable indistinct left lower lobe airspace opacity.  IMPRESSION: 1. Mildly improved aeration at the right lung base, although the airspace opacity in this vicinity has not completely cleared. 2. Low lung volumes are present, causing crowding of the pulmonary vasculature. 3. Enlarged cardiopericardial silhouette.   Electronically Signed   By: Sherryl Barters M.D.   On: 01/13/2014 17:01     ASSESSMENT / PLAN:  Acute perforated appendicitis with intra-abdominal abscess s/p drain 9/30 with re-positioning 10/2. Plan: Therapy per CCS, IR NPO Day 5 of zosyn  Sepsis 2nd to above. Plan: Continue IV fluids  Atelectasis with small b/l pleural effusions. Plan: F/u CXR intermittently Bronchial hygiene  Hx of Seizures >> has been off lamictal since her recent pregnancy. Plan: Monitor off  AED's  Anemia. Plan: F/u CBC Transfuse for Hb < 7 of bleeding  Transfer SDU 10/03.   Chesley Mires, MD [facility] Pulmonary/Critical Care 01/14/2014, 7:39 AM Pager:  [phone] After 3pm call: [phone]

## 2014-01-14 NOTE — Progress Notes (Signed)
Patient arrived to unit accompanied by 5M RN and tech. Pt oriented to room/unit, no s/s of acute distress noted, VS stable.

## 2014-01-14 NOTE — Progress Notes (Signed)
Subjective: No complaints. For upsizing of drain today by IR  Objective: Vital signs in last 24 hours: Temp:  [98.8 F (37.1 C)-100.4 F (38 C)] 99.1 F (37.3 C) (10/03 0737) Pulse Rate:  [85-114] 91 (10/03 0600) Resp:  [11-21] 19 (10/03 0600) BP: (93-114)/(54-78) 111/64 mmHg (10/03 0600) SpO2:  [94 %-100 %] 100 % (10/03 0600) Weight:  [146 lb 13.2 oz (66.6 kg)] 146 lb 13.2 oz (66.6 kg) (10/02 1600) Last BM Date: 01/10/14 (Per patients report)  Intake/Output from previous day: 10/02 0701 - 10/03 0700 In: 1362.5 [I.V.:1300; IV Piggyback:62.5] Out: 1250 [Urine:1250] Intake/Output this shift: Total I/O In: -  Out: 350 [Urine:350]  Resp: clear to auscultation bilaterally Cardio: regular rate and rhythm GI: soft, only tender on right where drain is  Lab Results:   Recent Labs  01/13/14 2157 01/14/14 0256  WBC 18.9* 15.1*  HGB 8.1* 8.0*  HCT 26.5* 26.8*  PLT 435* 410*   BMET  Recent Labs  01/13/14 2157 01/14/14 0256  NA 141 143  K 3.7 4.0  CL 102 104  CO2 28 30  GLUCOSE 81 92  BUN 4* 4*  CREATININE 0.84 0.95  CALCIUM 7.9* 8.2*   PT/INR No results found for this basename: LABPROT, INR,  in the last 72 hours ABG No results found for this basename: PHART, PCO2, PO2, HCO3,  in the last 72 hours  Studies/Results: Ct Abdomen Pelvis W Contrast  01/13/2014   CLINICAL DATA:  Recent history of ruptured appendicitis with abscess and subsequent placement of pigtail drainage catheter.  EXAM: CT ABDOMEN AND PELVIS WITH CONTRAST  TECHNIQUE: Multidetector CT imaging of the abdomen and pelvis was performed using the standard protocol following bolus administration of intravenous contrast.  CONTRAST:  60mL OMNIPAQUE IOHEXOL 300 MG/ML  SOLN  COMPARISON:  01/10/2014 and 01/11/2014  FINDINGS: Lung bases demonstrate worsening moderate bibasilar atelectasis and minimal lingular atelectasis with tiny amount of bilateral pleural fluid.  Abdominal images demonstrate the liver,  spleen, pancreas, gallbladder and adrenal glands to be within normal. Kidneys normal in size without hydronephrosis. There are changes bilaterally of mild peripheral cortical calcification suggesting nephrocalcinosis which is a new finding.  There is evidence of patient's pigtail drainage catheter within the known right lower quadrant abscess. The catheter appears in adequate position within the abscess. The abscess is unchanged in size measuring 6.9 x 8.2 cm. There is no free peritoneal air. No changes several small mesenteric lymph nodes in the right lower quadrant. There is slight worsening of a fluid over the right flank subcutaneous tissues.  Pelvic images again demonstrate a mild amount of free fluid without significant change. Remainder the exam is unchanged.  IMPRESSION: No significant change in patient's right lower quadrant abscess measuring 6.9 x 0.2 cm as the pigtail drainage catheter appears in adequate position within the abscess cavity. Stable mild associated free fluid in the lower abdomen/pelvis.  Worsening bibasilar atelectasis with tiny amount of bilateral pleural fluid.  New finding of mild bilateral nephrocalcinosis.  Worsening subcutaneous fluid over the right flank.   Electronically Signed   By: Marin Olp M.D.   On: 01/13/2014 21:36   Dg Chest Port 1 View  01/13/2014   CLINICAL DATA:  Shortness of breath.  Tobacco use.  EXAM: PORTABLE CHEST - 1 VIEW  COMPARISON:  01/13/2014 at 912 00 a.m.  FINDINGS: Low lung volumes are present, causing crowding of the pulmonary vasculature. Stably enlarged cardiopericardial silhouette. Stable scarring or atelectasis along the minor fissure.  Slightly improved  airspace opacity at the right lung base. Stable indistinct left lower lobe airspace opacity.  IMPRESSION: 1. Mildly improved aeration at the right lung base, although the airspace opacity in this vicinity has not completely cleared. 2. Low lung volumes are present, causing crowding of the  pulmonary vasculature. 3. Enlarged cardiopericardial silhouette.   Electronically Signed   By: Sherryl Barters M.D.   On: 01/13/2014 17:01    Anti-infectives: Anti-infectives   Start     Dose/Rate Route Frequency Ordered Stop   01/13/14 1800  piperacillin-tazobactam (ZOSYN) IVPB 3.375 g     3.375 g 12.5 mL/hr over 240 Minutes Intravenous 3 times per day 01/13/14 1642        Assessment/Plan: s/p * No surgery found * will continue bowel rest until IR changes drain Ok for transfer to stepdown today Continue abx  LOS: 1 day    [name],[name] 01/14/2014

## 2014-01-14 NOTE — Progress Notes (Signed)
Patient ID: Jamie [name], female   DOB: 08-29-1990, 23 y.o.   MRN: [id number]   Referring Physician(s): CCS  Subjective: pt with recent N/V, RLQ discomfort ; asked by CCS to eval recently placed RLQ/appendiceal abscess drainage cath (performed at [facility]) secondary to poor output    Allergies: Review of patient's allergies indicates no known allergies.  Medications: Prior to Admission medications   Medication Sig Start Date End Date Taking? Authorizing Provider  acetaminophen (TYLENOL) 325 MG tablet Take 650 mg by mouth every 6 (six) hours as needed for moderate pain.   Yes Historical Provider, MD    Review of Systems see above  Vital Signs: BP 115/79  Pulse 79  Temp(Src) 99.2 F (37.3 C) (Oral)  Resp 21  Ht 5' 0.5" (1.537 m)  Wt 146 lb 13.2 oz (66.6 kg)  BMI 28.19 kg/m2  SpO2 100%  Physical Exam pt awake/alert; RLQ drain intact, connected to lengthy foley bag with approx 10-20 cc's thick beige colored fluid in bag ?reason; has not been irrigated appropriately per pt; drain irrigated with 10 cc's sterile NS with return of 20 cc's thick purulent beige colored fluid; drain connected to JP bulb; insertion site ok,mildly tender   Imaging: Ct Abdomen Pelvis W Contrast  01/13/2014   CLINICAL DATA:  Recent history of ruptured appendicitis with abscess and subsequent placement of pigtail drainage catheter.  EXAM: CT ABDOMEN AND PELVIS WITH CONTRAST  TECHNIQUE: Multidetector CT imaging of the abdomen and pelvis was performed using the standard protocol following bolus administration of intravenous contrast.  CONTRAST:  30mL OMNIPAQUE IOHEXOL 300 MG/ML  SOLN  COMPARISON:  01/10/2014 and 01/11/2014  FINDINGS: Lung bases demonstrate worsening moderate bibasilar atelectasis and minimal lingular atelectasis with tiny amount of bilateral pleural fluid.  Abdominal images demonstrate the liver, spleen, pancreas, gallbladder and adrenal glands to be within normal. Kidneys normal in size  without hydronephrosis. There are changes bilaterally of mild peripheral cortical calcification suggesting nephrocalcinosis which is a new finding.  There is evidence of patient's pigtail drainage catheter within the known right lower quadrant abscess. The catheter appears in adequate position within the abscess. The abscess is unchanged in size measuring 6.9 x 8.2 cm. There is no free peritoneal air. No changes several small mesenteric lymph nodes in the right lower quadrant. There is slight worsening of a fluid over the right flank subcutaneous tissues.  Pelvic images again demonstrate a mild amount of free fluid without significant change. Remainder the exam is unchanged.  IMPRESSION: No significant change in patient's right lower quadrant abscess measuring 6.9 x 0.2 cm as the pigtail drainage catheter appears in adequate position within the abscess cavity. Stable mild associated free fluid in the lower abdomen/pelvis.  Worsening bibasilar atelectasis with tiny amount of bilateral pleural fluid.  New finding of mild bilateral nephrocalcinosis.  Worsening subcutaneous fluid over the right flank.   Electronically Signed   By: Marin Olp M.D.   On: 01/13/2014 21:36   Dg Chest Port 1 View  01/13/2014   CLINICAL DATA:  Shortness of breath.  Tobacco use.  EXAM: PORTABLE CHEST - 1 VIEW  COMPARISON:  01/13/2014 at 912 00 a.m.  FINDINGS: Low lung volumes are present, causing crowding of the pulmonary vasculature. Stably enlarged cardiopericardial silhouette. Stable scarring or atelectasis along the minor fissure.  Slightly improved airspace opacity at the right lung base. Stable indistinct left lower lobe airspace opacity.  IMPRESSION: 1. Mildly improved aeration at the right lung base, although the airspace opacity in  this vicinity has not completely cleared. 2. Low lung volumes are present, causing crowding of the pulmonary vasculature. 3. Enlarged cardiopericardial silhouette.   Electronically Signed   By: Sherryl Barters M.D.   On: 01/13/2014 17:01    Labs:  CBC:  Recent Labs  01/13/14 2157 01/14/14 0256  WBC 18.9* 15.1*  HGB 8.1* 8.0*  HCT 26.5* 26.8*  PLT 435* 410*    COAGS: No results found for this basename: INR, APTT,  in the last 8760 hours  BMP:  Recent Labs  01/13/14 2157 01/14/14 0256  NA 141 143  K 3.7 4.0  CL 102 104  CO2 28 30  GLUCOSE 81 92  BUN 4* 4*  CALCIUM 7.9* 8.2*  CREATININE 0.84 0.95  GFRNONAA >90 84*  GFRAA >90 >90    LIVER FUNCTION TESTS:  Recent Labs  01/13/14 2157  BILITOT 0.3  AST 12  ALT 6  ALKPHOS 101  PROT 6.7  ALBUMIN 1.7*    Assessment and Plan: Pt s/p perc drainage of appendiceal abscess 9/30 at [facility]; now with poor output; drain originally placed to long foley bag and has not been irrigated properly; f/u CT done 10/2 reveals adequate positioning of drainage catheter but no sig decrease in size of abscess- images were reviewed by Dr. Vernard Gambles. Drain irrigated today with 10 cc's sterile NS with return of purulent thick beige fluid and attached to JP bulb suction. Will initiate tid drain flushes and monitor output- if output remains minimal can then pursue upsizing of drain (currently 71F). Drain fluid submitted for cultures. Above d/w pt/nurse.     I spent a total of 15  minutes face to face in clinical consultation/evaluation, greater than 50% of which was counseling/coordinating care for RLQ abscess drain.  Signed: Autumn Messing 01/14/2014, 1:24 PM

## 2014-01-15 ENCOUNTER — Encounter (HOSPITAL_COMMUNITY): Payer: Self-pay | Admitting: *Deleted

## 2014-01-15 ENCOUNTER — Inpatient Hospital Stay (HOSPITAL_COMMUNITY): Payer: Medicaid Other

## 2014-01-15 LAB — COMPREHENSIVE METABOLIC PANEL
ALBUMIN: 1.6 g/dL — AB (ref 3.5–5.2)
ALT: 5 U/L (ref 0–35)
AST: 12 U/L (ref 0–37)
Alkaline Phosphatase: 112 U/L (ref 39–117)
Anion gap: 9 (ref 5–15)
BUN: 5 mg/dL — ABNORMAL LOW (ref 6–23)
CALCIUM: 8 mg/dL — AB (ref 8.4–10.5)
CO2: 28 mEq/L (ref 19–32)
CREATININE: 0.8 mg/dL (ref 0.50–1.10)
Chloride: 107 mEq/L (ref 96–112)
GFR calc Af Amer: >90 mL/min (ref >90)
GFR calc non Af Amer: >90 mL/min (ref >90)
Glucose, Bld: 97 mg/dL (ref 70–99)
Potassium: 4.3 mEq/L (ref 3.7–5.3)
SODIUM: 144 meq/L (ref 137–147)
TOTAL PROTEIN: 6.4 g/dL (ref 6.0–8.3)
Total Bilirubin: 0.3 mg/dL (ref 0.3–1.2)

## 2014-01-15 LAB — CBC
HCT: 26.1 % — ABNORMAL LOW (ref 36.0–46.0)
Hemoglobin: 7.9 g/dL — ABNORMAL LOW (ref 12.0–15.0)
MCH: 23 pg — AB (ref 26.0–34.0)
MCHC: 30.3 g/dL (ref 30.0–36.0)
MCV: 76.1 fL — AB (ref 78.0–100.0)
PLATELETS: 474 10*3/uL — AB (ref 150–400)
RBC: 3.43 MIL/uL — ABNORMAL LOW (ref 3.87–5.11)
RDW: 16.8 % — AB (ref 11.5–15.5)
WBC: 12.5 10*3/uL — ABNORMAL HIGH (ref 4.0–10.5)

## 2014-01-15 LAB — MAGNESIUM: Magnesium: 1.6 mg/dL (ref 1.5–2.5)

## 2014-01-15 MED ORDER — MAGNESIUM SULFATE 40 MG/ML IJ SOLN
2.0000 g | Freq: Once | INTRAMUSCULAR | Status: AC
  Administered 2014-01-15: 2 g via INTRAVENOUS
  Filled 2014-01-15: qty 50

## 2014-01-15 MED ORDER — VANCOMYCIN HCL IN DEXTROSE 750-5 MG/150ML-% IV SOLN
750.0000 mg | Freq: Three times a day (TID) | INTRAVENOUS | Status: DC
  Filled 2014-01-15 (×2): qty 150

## 2014-01-15 MED ORDER — LIDOCAINE HCL (PF) 1 % IJ SOLN
INTRAMUSCULAR | Status: AC
  Filled 2014-01-15: qty 30

## 2014-01-15 MED ORDER — FENTANYL CITRATE 0.05 MG/ML IJ SOLN
INTRAMUSCULAR | Status: AC
  Filled 2014-01-15: qty 2

## 2014-01-15 MED ORDER — VANCOMYCIN HCL 10 G IV SOLR
1250.0000 mg | Freq: Once | INTRAVENOUS | Status: AC
  Administered 2014-01-15: 1250 mg via INTRAVENOUS
  Filled 2014-01-15: qty 1250

## 2014-01-15 MED ORDER — LIDOCAINE HCL 1 % IJ SOLN
INTRAMUSCULAR | Status: AC
  Filled 2014-01-15: qty 20

## 2014-01-15 MED ORDER — FENTANYL CITRATE 0.05 MG/ML IJ SOLN
INTRAMUSCULAR | Status: AC | PRN
  Administered 2014-01-15 (×2): 50 ug via INTRAVENOUS

## 2014-01-15 MED ORDER — MIDAZOLAM HCL 2 MG/2ML IJ SOLN
INTRAMUSCULAR | Status: AC
  Filled 2014-01-15: qty 2

## 2014-01-15 MED ORDER — BISACODYL 10 MG RE SUPP: 10.0000 mg | Freq: Every day | RECTAL | Status: DC | PRN

## 2014-01-15 MED ORDER — VANCOMYCIN HCL IN DEXTROSE 750-5 MG/150ML-% IV SOLN
750.0000 mg | Freq: Three times a day (TID) | INTRAVENOUS | Status: DC
  Administered 2014-01-15 – 2014-01-17 (×5): 750 mg via INTRAVENOUS
  Filled 2014-01-15 (×7): qty 150

## 2014-01-15 MED ORDER — MIDAZOLAM HCL 2 MG/2ML IJ SOLN
INTRAMUSCULAR | Status: AC | PRN
  Administered 2014-01-15 (×2): 1 mg via INTRAVENOUS

## 2014-01-15 NOTE — Procedures (Signed)
Exchange, revise and upsize RLQ drain under CT; 106F placed, 26ml purulent out, sent for GS, C&S No complication No blood loss. See complete dictation in [facility].

## 2014-01-15 NOTE — Progress Notes (Signed)
ANTIBIOTIC CONSULT NOTE - INITIAL  Pharmacy Consult for vancomycin/zosyn Indication: appendicitis  No Known Allergies  Patient Measurements: Height: 5' 0.5" (153.7 cm) Weight: 147 lb 4.3 oz (66.8 kg) IBW/kg (Calculated) : 46.65  Vital Signs: Temp: 98.1 F (36.7 C) (10/04 0700) Temp Source: Oral (10/04 0700) BP: 113/76 mmHg (10/04 0403) Pulse Rate: 73 (10/04 0403) Intake/Output from previous day: 10/03 0701 - 10/04 0700 In: 2382.5 [I.V.:2175; IV Piggyback:187.5] Out: 1200 [Urine:1100; Emesis/NG output:100] Intake/Output from this shift: Total I/O In: 363.3 [I.V.:363.3] Out: 200 [Urine:200]  Labs:  Recent Labs  01/13/14 2157 01/14/14 0256 01/15/14 0240  WBC 18.9* 15.1* 12.5*  HGB 8.1* 8.0* 7.9*  PLT 435* 410* 474*  CREATININE 0.84 0.95 0.80   Estimated Creatinine Clearance: 95.3 ml/min (by C-G formula based on Cr of 0.8). No results found for this basename: VANCOTROUGH, Corlis Leak, VANCORANDOM, [place], GENTPEAK, GENTRANDOM, TOBRATROUGH, TOBRAPEAK, TOBRARND, AMIKACINPEAK, AMIKACINTROU, AMIKACIN,  in the last 72 hours   Microbiology: Recent Results (from the past 720 hour(s))  MRSA PCR SCREENING     Status: None   Collection Time    01/13/14  4:03 PM      Result Value Ref Range Status   MRSA by PCR NEGATIVE  NEGATIVE Final   Comment:            The GeneXpert MRSA Assay (FDA     approved for NASAL specimens     only), is one component of a     comprehensive MRSA colonization     surveillance program. It is not     intended to diagnose MRSA     infection nor to guide or     monitor treatment for     MRSA infections.  BODY FLUID CULTURE     Status: None   Collection Time    01/14/14  1:48 PM      Result Value Ref Range Status   Specimen Description PERITONEAL CAVITY   Final   Special Requests FLUID FROM RLQ DRAIN FOR CULURE   Final   Gram Stain     Final   Value: ABUNDANT WBC PRESENT, PREDOMINANTLY PMN     ABUNDANT GRAM POSITIVE COCCI IN PAIRS AND CHAINS      Gram Stain Report Called to,Read Back By and Verified With: Gram Stain Report Called to,Read Back By and Verified With: Abagail Kitchens RN on 01/15/14 at 02:15 by Rise Mu     Performed at [facility]   Culture     Final   Value: NO GROWTH 1 DAY     Performed at [facility]   Report Status PENDING   Incomplete    Medical History: Past Medical History  Diagnosis Date  . Seizure     Medications:  Scheduled:  . antiseptic oral rinse  7 mL Mouth Rinse BID  . enoxaparin (LOVENOX) injection  40 mg Subcutaneous Q24H  . lidocaine      . piperacillin-tazobactam (ZOSYN)  IV  3.375 g Intravenous 3 times per day   Assessment: 23 yo f admitted on 10/2 who is 5 weeks s/p C section transferred from [place] with persistent SIRS, abdominal pain due to RLQ abscess.  Patient has been on zosyn and pharmacy is consulted today to begin vancomycin for abundant gram positive cocci in pairs and clusters in her peritoneal cavity culture.  Patient weights 66.8 kg, CrCl ~ 95 ml/min, SCr 0.8 today.  Will give a 1,250 mg load x 1 (~18 mg/kg) followed by a 750  mg IV q8hr regimen. Continue current zosyn dose.  10/3 peritoneal cavity: abundant gram positive cocci in pairs and chains  Zosyn 10/2 >> Vancomycin 10/4 >>   Goal of Therapy:  Vancomycin trough level 15-20 mcg/ml  Plan:  Vancomycin 1,250 mg x 1 Vancomycin 750 mg IV q8hr Continue zosyn 3.375 gm IV q8hr (4-hr infusion) Monitor CBC, temperature curve, renal function, cultures, clinical course  Cassie L. Nicole Kindred, PharmD Clinical Pharmacy Resident Pager: [phone] 01/15/2014 10:01 AM

## 2014-01-15 NOTE — Progress Notes (Signed)
[facility] Progress Note     Subjective: Pt emotional/frustrated.  Doesn't want to have the drain changed because it hurt last time, but is agreeable.  Wants to get back to her 58mo old baby girl.  Some flatus, no BM.  Ambulating some.  IS to 750/4000.  NPO.  Objective: Vital signs in last 24 hours: Temp:  [98 F (36.7 C)-99.2 F (37.3 C)] 98 F (36.7 C) (10/04 0403) Pulse Rate:  [73-95] 73 (10/04 0403) Resp:  [0-21] 10 (10/04 0403) BP: (91-123)/(50-79) 113/76 mmHg (10/04 0403) SpO2:  [100 %] 100 % (10/04 0403) Weight:  [147 lb 4.3 oz (66.8 kg)] 147 lb 4.3 oz (66.8 kg) (10/04 0403) Last BM Date: 01/10/14  Intake/Output from previous day: 10/03 0701 - 10/04 0700 In: 1857.5 [I.V.:1700; IV Piggyback:137.5] Out: 1200 [Urine:1100; Emesis/NG output:100] Intake/Output this shift:    PE: Gen:  Alert, NAD, pleasant Abd: Soft, tender in RLQ, right groin, and over drain, ND, +BS, no HSM, drain with a few drops of purulent drainage 88ml/24 hr recorded  Lab Results:   Recent Labs  01/14/14 0256 01/15/14 0240  WBC 15.1* 12.5*  HGB 8.0* 7.9*  HCT 26.8* 26.1*  PLT 410* 474*   BMET  Recent Labs  01/14/14 0256 01/15/14 0240  NA 143 144  K 4.0 4.3  CL 104 107  CO2 30 28  GLUCOSE 92 97  BUN 4* 5*  CREATININE 0.95 0.80  CALCIUM 8.2* 8.0*   PT/INR No results found for this basename: LABPROT, INR,  in the last 72 hours CMP     Component Value Date/Time   NA 144 01/15/2014 0240   K 4.3 01/15/2014 0240   CL 107 01/15/2014 0240   CO2 28 01/15/2014 0240   GLUCOSE 97 01/15/2014 0240   BUN 5* 01/15/2014 0240   CREATININE 0.80 01/15/2014 0240   CALCIUM 8.0* 01/15/2014 0240   PROT 6.4 01/15/2014 0240   ALBUMIN 1.6* 01/15/2014 0240   AST 12 01/15/2014 0240   ALT 5 01/15/2014 0240   ALKPHOS 112 01/15/2014 0240   BILITOT 0.3 01/15/2014 0240   GFRNONAA >90 01/15/2014 0240   GFRAA >90 01/15/2014 0240   Lipase  No results found for this basename: lipase        Studies/Results: Ct Abdomen Pelvis W Contrast  01/13/2014   CLINICAL DATA:  Recent history of ruptured appendicitis with abscess and subsequent placement of pigtail drainage catheter.  EXAM: CT ABDOMEN AND PELVIS WITH CONTRAST  TECHNIQUE: Multidetector CT imaging of the abdomen and pelvis was performed using the standard protocol following bolus administration of intravenous contrast.  CONTRAST:  65mL OMNIPAQUE IOHEXOL 300 MG/ML  SOLN  COMPARISON:  01/10/2014 and 01/11/2014  FINDINGS: Lung bases demonstrate worsening moderate bibasilar atelectasis and minimal lingular atelectasis with tiny amount of bilateral pleural fluid.  Abdominal images demonstrate the liver, spleen, pancreas, gallbladder and adrenal glands to be within normal. Kidneys normal in size without hydronephrosis. There are changes bilaterally of mild peripheral cortical calcification suggesting nephrocalcinosis which is a new finding.  There is evidence of patient's pigtail drainage catheter within the known right lower quadrant abscess. The catheter appears in adequate position within the abscess. The abscess is unchanged in size measuring 6.9 x 8.2 cm. There is no free peritoneal air. No changes several small mesenteric lymph nodes in the right lower quadrant. There is slight worsening of a fluid over the right flank subcutaneous tissues.  Pelvic images again demonstrate a mild amount of free fluid without  significant change. Remainder the exam is unchanged.  IMPRESSION: No significant change in patient's right lower quadrant abscess measuring 6.9 x 0.2 cm as the pigtail drainage catheter appears in adequate position within the abscess cavity. Stable mild associated free fluid in the lower abdomen/pelvis.  Worsening bibasilar atelectasis with tiny amount of bilateral pleural fluid.  New finding of mild bilateral nephrocalcinosis.  Worsening subcutaneous fluid over the right flank.   Electronically Signed   By: Marin Olp M.D.   On:  01/13/2014 21:36   Dg Chest Port 1 View  01/13/2014   CLINICAL DATA:  Shortness of breath.  Tobacco use.  EXAM: PORTABLE CHEST - 1 VIEW  COMPARISON:  01/13/2014 at 912 00 a.m.  FINDINGS: Low lung volumes are present, causing crowding of the pulmonary vasculature. Stably enlarged cardiopericardial silhouette. Stable scarring or atelectasis along the minor fissure.  Slightly improved airspace opacity at the right lung base. Stable indistinct left lower lobe airspace opacity.  IMPRESSION: 1. Mildly improved aeration at the right lung base, although the airspace opacity in this vicinity has not completely cleared. 2. Low lung volumes are present, causing crowding of the pulmonary vasculature. 3. Enlarged cardiopericardial silhouette.   Electronically Signed   By: Sherryl Barters M.D.   On: 01/13/2014 17:01    Anti-infectives: Anti-infectives   Start     Dose/Rate Route Frequency Ordered Stop   01/13/14 1800  piperacillin-tazobactam (ZOSYN) IVPB 3.375 g     3.375 g 12.5 mL/hr over 240 Minutes Intravenous 3 times per day 01/13/14 1642         Assessment/Plan 1. Acute perforated appendicitis with large intra-abdominal abscess, s/p Perc drain on 01-11-14 at [facility] 2. Bipolar disorder  3. Seizure disorder  4. Leukocytosis - improved 12.5  Plan:  1. Drain was not functioning as it was clogged, IR irrigated it with return of purulent drainage, drain in good position on CT on 01/13/14 2.  Zero recorded as output in IR drain.  IR's (Dr. Vernard Gambles ready to upsize drain today) 3.  IV antibiotics Day #3 Zosyn, add vancomycin given gram pos pairs and chains 4.  NPO until IR decides if proceeding with Drain upsizing 5.  Ambulate and IS 6.  SCD's and lovenox (last given at 1800)     LOS: 2 days    DORT, Concha Sudol 01/15/2014, 8:19 AM Pager: [phone]

## 2014-01-15 NOTE — Progress Notes (Signed)
Not sure drain functional, IR will try to change today, afterwards may have clears. Follow up cultures from drain. If not able to drain any better can consider iv abx as tx as it appears she is improving just with this.

## 2014-01-15 NOTE — Progress Notes (Signed)
Informed Bruna Dills, RN at [phone] (CC on call DR) of fluid cultures results. Message will be given to DR and RN on 3S will monitor for new orders.

## 2014-01-15 NOTE — Progress Notes (Signed)
[facility] Progress Note Patient Name: Jamie [name] DOB: April 12, 1991 MRN: [id number]   Date of Service  01/15/2014  HPI/Events of Note  Hypomag  eICU Interventions  Mag replaced     Intervention Category Intermediate Interventions: Electrolyte abnormality - evaluation and management  Rumor Sun 01/15/2014, 3:23 AM

## 2014-01-15 NOTE — Progress Notes (Signed)
Pt has flat affect,appears to have no energy or desire to get out of bed.  When engaging pt in conversation about baby, pt doesn't speak of baby.  Pt refusing to do things for herself, ie, "hand me my drink", "can you give me my cell phone" (phone on table in front of her), "i cant use the BSC, i need the bp" Pt encouraged to move around and to become more independent.  Will continue to motivate pt.

## 2014-01-16 LAB — CBC
HCT: 27.9 % — ABNORMAL LOW (ref 36.0–46.0)
Hemoglobin: 8.4 g/dL — ABNORMAL LOW (ref 12.0–15.0)
MCH: 22.8 pg — AB (ref 26.0–34.0)
MCHC: 30.1 g/dL (ref 30.0–36.0)
MCV: 75.6 fL — AB (ref 78.0–100.0)
PLATELETS: 509 10*3/uL — AB (ref 150–400)
RBC: 3.69 MIL/uL — AB (ref 3.87–5.11)
RDW: 16.8 % — ABNORMAL HIGH (ref 11.5–15.5)
WBC: 11.7 10*3/uL — AB (ref 4.0–10.5)

## 2014-01-16 MED ORDER — ACETAMINOPHEN 325 MG PO TABS
650.0000 mg | ORAL_TABLET | Freq: Four times a day (QID) | ORAL | Status: DC | PRN
  Administered 2014-01-16: 650 mg via ORAL
  Filled 2014-01-16: qty 2

## 2014-01-16 MED ORDER — HYDROCODONE-ACETAMINOPHEN 5-325 MG PO TABS
1.0000 | ORAL_TABLET | ORAL | Status: DC | PRN
  Administered 2014-01-16: 1 via ORAL
  Filled 2014-01-16: qty 1

## 2014-01-16 NOTE — Progress Notes (Signed)
Patient ID: Jamie [name], female   DOB: March 09, 1991, 23 y.o.   MRN: [id number]   Referring Physician(s): CCS  Subjective:  Pt feeling better; has ambulated in hall, had BM, tolerating diet well; denies N/V or sig abd pain; anxious to go home  Allergies: Review of patient's allergies indicates no known allergies.  Medications: Prior to Admission medications   Medication Sig Start Date End Date Taking? Authorizing Provider  acetaminophen (TYLENOL) 325 MG tablet Take 650 mg by mouth every 6 (six) hours as needed for moderate pain.   Yes Historical Provider, MD    Review of Systems  see above  Vital Signs: BP 127/82  Pulse 67  Temp(Src) 98.1 F (36.7 C) (Oral)  Resp 25  Ht 5' 0.5" (1.537 m)  Wt 147 lb 0.8 oz (66.7 kg)  BMI 28.23 kg/m2  SpO2 98%  Physical Exam pt awake/alert; RLQ drain intact, insertion site with small amt leaking noted, site NT, output minimal; drain flushed with 10 cc's sterile NS with no sig return  Imaging: Ct Abdomen Pelvis W Contrast  01/13/2014   CLINICAL DATA:  Recent history of ruptured appendicitis with abscess and subsequent placement of pigtail drainage catheter.  EXAM: CT ABDOMEN AND PELVIS WITH CONTRAST  TECHNIQUE: Multidetector CT imaging of the abdomen and pelvis was performed using the standard protocol following bolus administration of intravenous contrast.  CONTRAST:  45mL OMNIPAQUE IOHEXOL 300 MG/ML  SOLN  COMPARISON:  01/10/2014 and 01/11/2014  FINDINGS: Lung bases demonstrate worsening moderate bibasilar atelectasis and minimal lingular atelectasis with tiny amount of bilateral pleural fluid.  Abdominal images demonstrate the liver, spleen, pancreas, gallbladder and adrenal glands to be within normal. Kidneys normal in size without hydronephrosis. There are changes bilaterally of mild peripheral cortical calcification suggesting nephrocalcinosis which is a new finding.  There is evidence of patient's pigtail drainage catheter within the known  right lower quadrant abscess. The catheter appears in adequate position within the abscess. The abscess is unchanged in size measuring 6.9 x 8.2 cm. There is no free peritoneal air. No changes several small mesenteric lymph nodes in the right lower quadrant. There is slight worsening of a fluid over the right flank subcutaneous tissues.  Pelvic images again demonstrate a mild amount of free fluid without significant change. Remainder the exam is unchanged.  IMPRESSION: No significant change in patient's right lower quadrant abscess measuring 6.9 x 0.2 cm as the pigtail drainage catheter appears in adequate position within the abscess cavity. Stable mild associated free fluid in the lower abdomen/pelvis.  Worsening bibasilar atelectasis with tiny amount of bilateral pleural fluid.  New finding of mild bilateral nephrocalcinosis.  Worsening subcutaneous fluid over the right flank.   Electronically Signed   By: Marin Olp M.D.   On: 01/13/2014 21:36   Dg Chest Port 1 View  01/13/2014   CLINICAL DATA:  Shortness of breath.  Tobacco use.  EXAM: PORTABLE CHEST - 1 VIEW  COMPARISON:  01/13/2014 at 912 00 a.m.  FINDINGS: Low lung volumes are present, causing crowding of the pulmonary vasculature. Stably enlarged cardiopericardial silhouette. Stable scarring or atelectasis along the minor fissure.  Slightly improved airspace opacity at the right lung base. Stable indistinct left lower lobe airspace opacity.  IMPRESSION: 1. Mildly improved aeration at the right lung base, although the airspace opacity in this vicinity has not completely cleared. 2. Low lung volumes are present, causing crowding of the pulmonary vasculature. 3. Enlarged cardiopericardial silhouette.   Electronically Signed   By: Franchot Mimes  Janeece Fitting M.D.   On: 01/13/2014 17:01   Ct Image Guided Drainage By Percutaneous Catheter  01/15/2014   CLINICAL DATA:  Ruptured appendicitis, post percutaneous drain catheter placement. Incomplete drainage of dominant  peritoneal component.  EXAM: CT GUIDED REVISION OF PERITONEAL ABSCESS DRAIN CATHETER  ANESTHESIA/SEDATION: Intravenous Fentanyl and Versed were administered as conscious sedation during continuous cardiorespiratory monitoring by the radiology RN, with a total moderate sedation time of 15 minutes.  PROCEDURE: The procedure, risks, benefits, and alternatives were explained to the patient. Questions regarding the procedure were encouraged and answered. The patient understands and consents to the procedure.  Select axial scans through the abdomen were obtained and the previously placed catheter position was confirmed.  The skin entry site and surrounding skin were prepped with Betadinein a sterile fashion, and a sterile drape was applied covering the operative field. A sterile gown and sterile gloves were used for the procedure. Local anesthesia was provided with 1% Lidocaine.  Under CT fluoroscopic guidance, the drain catheter was cut and removed over a short Amplatz stiff guidewire. Guidewire position within the inferior aspect of the dominant locule was confirmed. A 14 French pigtail catheter was advanced and positioned centrally within the dependent aspect of the collection. Approximately 20 mL of purulent material could be aspirated, sent for Gram stain, culture and sensitivity. Catheter secured externally with 0 Prolene suture and StatLock, and placed to gravity bag. The patient tolerated the procedure well.  COMPLICATIONS: None  FINDINGS: Some interval decrease in size of the dominant component of the right lower quadrant multiloculated abscess. The previously placed drain catheter was successfully revised and upsized as above.  IMPRESSION: 1. Technically successful upsize and revision of right lower quadrant peritoneal abscess drain catheter under CT guidance.   Electronically Signed   By: Arne Cleveland M.D.   On: 01/15/2014 11:15    Labs:  CBC:  Recent Labs  01/13/14 2157 01/14/14 0256  01/15/14 0240 01/16/14 0845  WBC 18.9* 15.1* 12.5* 11.7*  HGB 8.1* 8.0* 7.9* 8.4*  HCT 26.5* 26.8* 26.1* 27.9*  PLT 435* 410* 474* 509*    COAGS: No results found for this basename: INR, APTT,  in the last 8760 hours  BMP:  Recent Labs  01/13/14 2157 01/14/14 0256 01/15/14 0240  NA 141 143 144  K 3.7 4.0 4.3  CL 102 104 107  CO2 28 30 28   GLUCOSE 81 92 97  BUN 4* 4* 5*  CALCIUM 7.9* 8.2* 8.0*  CREATININE 0.84 0.95 0.80  GFRNONAA >90 84* >90  GFRAA >90 >90 >90    LIVER FUNCTION TESTS:  Recent Labs  01/13/14 2157 01/15/14 0240  BILITOT 0.3 0.3  AST 12 12  ALT 6 5  ALKPHOS 101 112  PROT 6.7 6.4  ALBUMIN 1.7* 1.6*    Assessment and Plan: Pt s/p perc drainage of appendiceal abscess 9/30 at [facility]; upsized to 14 f 10/4; WBC improving, cx's neg to date; drain output still minimal (collection multiloculated); cont diligent irrigation of drain; if d/c'd home with drain rec once daily flushing, recording of output and daily dressing changes; should f/u in drain clinic within 1 week or if output remains minimal consider f/u CT in next 48-72 hrs to reassess adequacy of drainage.      I spent a total of 15 minutes face to face in clinical consultation/evaluation, greater than 50% of which was counseling/coordinating care for RLQ abscess drain.  Signed: Autumn Messing 01/16/2014, 5:54 PM

## 2014-01-16 NOTE — Progress Notes (Signed)
Flushed RLQ drain q8hours as ordered with 5 cc NS.  No output noted from drain.

## 2014-01-16 NOTE — Progress Notes (Signed)
[facility] Progress Note     Subjective: Pt seems depressed.  No N/V, tolerating clears.  Drain put out 78mL at drain exchange yesterday, but none since.  Not getting out of bed and asking nurses to do everything for her.    Objective: Vital signs in last 24 hours: Temp:  [97.5 F (36.4 C)-99.6 F (37.6 C)] 97.5 F (36.4 C) (10/05 0506) Pulse Rate:  [79-102] 82 (10/05 0506) Resp:  [13-23] 17 (10/05 0506) BP: (103-124)/(60-81) 113/79 mmHg (10/05 0506) SpO2:  [94 %-100 %] 97 % (10/05 0506) Weight:  [147 lb 0.8 oz (66.7 kg)] 147 lb 0.8 oz (66.7 kg) (10/05 0303) Last BM Date: 01/10/14  Intake/Output from previous day: 10/04 0701 - 10/05 0700 In: 2282.5 [I.V.:1612.5; IV Piggyback:650] Out: 2000 [Urine:2000] Intake/Output this shift:    PE: Gen:  Alert, NAD, pleasant Abd: Soft, quite tender, +BS, no HSM, incisions C/D/I, drain with minimal sanguinous drainage, no abdominal scars noted Psych:  Seems agitated this am/annoyed by US examining her.  Depressed mood and flat affect.    Lab Results:   Recent Labs  01/14/14 0256 01/15/14 0240  WBC 15.1* 12.5*  HGB 8.0* 7.9*  HCT 26.8* 26.1*  PLT 410* 474*   BMET  Recent Labs  01/14/14 0256 01/15/14 0240  NA 143 144  K 4.0 4.3  CL 104 107  CO2 30 28  GLUCOSE 92 97  BUN 4* 5*  CREATININE 0.95 0.80  CALCIUM 8.2* 8.0*   PT/INR No results found for this basename: LABPROT, INR,  in the last 72 hours CMP     Component Value Date/Time   NA 144 01/15/2014 0240   K 4.3 01/15/2014 0240   CL 107 01/15/2014 0240   CO2 28 01/15/2014 0240   GLUCOSE 97 01/15/2014 0240   BUN 5* 01/15/2014 0240   CREATININE 0.80 01/15/2014 0240   CALCIUM 8.0* 01/15/2014 0240   PROT 6.4 01/15/2014 0240   ALBUMIN 1.6* 01/15/2014 0240   AST 12 01/15/2014 0240   ALT 5 01/15/2014 0240   ALKPHOS 112 01/15/2014 0240   BILITOT 0.3 01/15/2014 0240   GFRNONAA >90 01/15/2014 0240   GFRAA >90 01/15/2014 0240   Lipase  No results found for this  basename: lipase       Studies/Results: Ct Image Guided Drainage By Percutaneous Catheter  01/15/2014   CLINICAL DATA:  Ruptured appendicitis, post percutaneous drain catheter placement. Incomplete drainage of dominant peritoneal component.  EXAM: CT GUIDED REVISION OF PERITONEAL ABSCESS DRAIN CATHETER  ANESTHESIA/SEDATION: Intravenous Fentanyl and Versed were administered as conscious sedation during continuous cardiorespiratory monitoring by the radiology RN, with a total moderate sedation time of 15 minutes.  PROCEDURE: The procedure, risks, benefits, and alternatives were explained to the patient. Questions regarding the procedure were encouraged and answered. The patient understands and consents to the procedure.  Select axial scans through the abdomen were obtained and the previously placed catheter position was confirmed.  The skin entry site and surrounding skin were prepped with Betadinein a sterile fashion, and a sterile drape was applied covering the operative field. A sterile gown and sterile gloves were used for the procedure. Local anesthesia was provided with 1% Lidocaine.  Under CT fluoroscopic guidance, the drain catheter was cut and removed over a short Amplatz stiff guidewire. Guidewire position within the inferior aspect of the dominant locule was confirmed. A 14 French pigtail catheter was advanced and positioned centrally within the dependent aspect of the collection. Approximately 20 mL of purulent  material could be aspirated, sent for Gram stain, culture and sensitivity. Catheter secured externally with 0 Prolene suture and StatLock, and placed to gravity bag. The patient tolerated the procedure well.  COMPLICATIONS: None  FINDINGS: Some interval decrease in size of the dominant component of the right lower quadrant multiloculated abscess. The previously placed drain catheter was successfully revised and upsized as above.  IMPRESSION: 1. Technically successful upsize and revision of  right lower quadrant peritoneal abscess drain catheter under CT guidance.   Electronically Signed   By: Arne Cleveland M.D.   On: 01/15/2014 11:15    Anti-infectives: Anti-infectives   Start     Dose/Rate Route Frequency Ordered Stop   01/15/14 2030  vancomycin (VANCOCIN) IVPB 750 mg/150 ml premix     750 mg 150 mL/hr over 60 Minutes Intravenous Every 8 hours 01/15/14 1509     01/15/14 1830  vancomycin (VANCOCIN) IVPB 750 mg/150 ml premix  Status:  Discontinued     750 mg 150 mL/hr over 60 Minutes Intravenous Every 8 hours 01/15/14 1004 01/15/14 1509   01/15/14 1030  vancomycin (VANCOCIN) 1,250 mg in sodium chloride 0.9 % 250 mL IVPB     1,250 mg 166.7 mL/hr over 90 Minutes Intravenous  Once 01/15/14 1004 01/15/14 1355   01/13/14 1800  piperacillin-tazobactam (ZOSYN) IVPB 3.375 g     3.375 g 12.5 mL/hr over 240 Minutes Intravenous 3 times per day 01/13/14 1642         Assessment/Plan 1. Acute perforated appendicitis with large intra-abdominal abscess, s/p Perc drain on 01-11-14 at [facility]  2. Bipolar disorder  3. Seizure disorder  4. Leukocytosis - improved 12.5 on 10/4  Plan:  1. Drain upsized on 01/15/14 by IR despite flushing 2. Zero recorded as output in IR drain.  3. IV antibiotics Day #4 Zosyn, add vancomycin given gram pos pairs and chains  4. Advance diet 5. Encouraged aggressive ambulation and IS  6. SCD's and lovenox  7. Recheck CBC, maybe d/c tomorrow on 2 weeks oral abx to follow up in office and in IR's drain clinic 8. Transfer to floor after labs drawn 9.  No psych/seizure meds in the computer to resume her on    LOS: 3 days    DORT, Magda Muise 01/16/2014, 7:34 AM Pager: [phone]

## 2014-01-17 LAB — CBC
HCT: 27.7 % — ABNORMAL LOW (ref 36.0–46.0)
HEMOGLOBIN: 8.4 g/dL — AB (ref 12.0–15.0)
MCH: 23.1 pg — ABNORMAL LOW (ref 26.0–34.0)
MCHC: 30.3 g/dL (ref 30.0–36.0)
MCV: 76.1 fL — ABNORMAL LOW (ref 78.0–100.0)
Platelets: 537 10*3/uL — ABNORMAL HIGH (ref 150–400)
RBC: 3.64 MIL/uL — ABNORMAL LOW (ref 3.87–5.11)
RDW: 16.7 % — ABNORMAL HIGH (ref 11.5–15.5)
WBC: 9.6 10*3/uL (ref 4.0–10.5)

## 2014-01-17 LAB — BODY FLUID CULTURE

## 2014-01-17 MED ORDER — BISACODYL 10 MG RE SUPP: 10.0000 mg | Freq: Every day | RECTAL | Status: DC | PRN

## 2014-01-17 MED ORDER — CLINDAMYCIN HCL 300 MG PO CAPS: 300.0000 mg | ORAL_CAPSULE | Freq: Three times a day (TID) | ORAL | Status: DC

## 2014-01-17 MED ORDER — AMOXICILLIN-POT CLAVULANATE 875-125 MG PO TABS: 1.0000 | ORAL_TABLET | Freq: Two times a day (BID) | ORAL | Status: DC

## 2014-01-17 MED ORDER — HYDROCODONE-ACETAMINOPHEN 5-325 MG PO TABS: 1.0000 | ORAL_TABLET | ORAL | Status: DC | PRN

## 2014-01-17 NOTE — Discharge Instructions (Signed)
Appendicitis °Appendicitis is when the appendix is swollen (inflamed). The inflammation can lead to developing a hole (perforation) and a collection of pus (abscess). °CAUSES  °There is not always an obvious cause of appendicitis. Sometimes it is caused by an obstruction in the appendix. The obstruction can be caused by: °· A small, hard, pea-sized ball of stool (fecalith). °· Enlarged lymph glands in the appendix. °SYMPTOMS  °· Pain around your belly button (navel) that moves toward your lower right belly (abdomen). The pain can become more severe and sharp as time passes. °· Tenderness in the lower right abdomen. Pain gets worse if you cough or make a sudden movement. °· Feeling sick to your stomach (nauseous). °· Throwing up (vomiting). °· Loss of appetite. °· Fever. °· Constipation. °· Diarrhea. °· Generally not feeling well. °DIAGNOSIS  °· Physical exam. °· Blood tests. °· Urine test. °· X-rays or a CT scan may confirm the diagnosis. °TREATMENT  °Once the diagnosis of appendicitis is made, the most common treatment is to remove the appendix as soon as possible. This procedure is called appendectomy. In an open appendectomy, a cut (incision) is made in the lower right abdomen and the appendix is removed. In a laparoscopic appendectomy, usually 3 small incisions are made. Long, thin instruments and a camera tube are used to remove the appendix. Most patients go home in 24 to 48 hours after appendectomy. °In some situations, the appendix may have already perforated and an abscess may have formed. The abscess may have a "wall" around it as seen on a CT scan. In this case, a drain may be placed into the abscess to remove fluid, and you may be treated with antibiotic medicines that kill germs. The medicine is given through a tube in your vein (IV). Once the abscess has resolved, it may or may not be necessary to have an appendectomy. You may need to stay in the hospital longer than 48 hours. °Document Released:  03/31/2005 Document Revised: 09/30/2011 Document Reviewed: 06/26/2009 °ExitCare® Patient Information ©2015 [employer]. This information is not intended to replace advice given to you by your health care provider. Make sure you discuss any questions you have with your health care provider. ° °

## 2014-01-17 NOTE — Discharge Summary (Signed)
[facility] Discharge Summary   Patient ID: Jamie [name] MRN: [id number] DOB/AGE: Oct 20, 1990 23 y.o.  Admit date: 01/13/2014 Discharge date: 01/17/2014  Admitting Diagnosis: Acute appendicitis with abscess Post-partum  Discharge Diagnosis Patient Active Problem List   Diagnosis Date Noted  . Appendicitis 01/13/2014  . Severe sepsis 01/13/2014  . Intra-abdominal abscess 01/13/2014    Consultants Interventional radiology (Dr. Vernard Gambles)  Imaging: No results found.  Procedures Dr. Vernard Gambles (01/15/14) - Upsize RLQ drain under CT guidance (01/11/14) - Initial perc drain placement to RLQ under CT guidance   Hospital Course:  23 yo black female who has a h/o bipolar disorder, seizure disorder, and some mental health issues secondary to her biologic mom doing drugs and ETOH while pregnant. The patient is 5 weeks post partum from a c-section. She started her period last week and also began having abdominal pain. She thought he pain was secondary to her period. It was on the right side. She denies any fevers at home. She denies nausea, vomiting. She finally went to [facility] on Tuesday where she had a CT scan that revealed acute perforated appendicitis with a large well developed abscess. She was admitted and started on IV abx therapy. She had a percutaneous drain placed on Wednesday, but with minimal output since placement. She has continued to have abdominal pain, but is improved. She has been tachycardic with a small pericardial effusion. 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 She was also felt to possibly have bilateral pneumonia on CT scan. She is currently on Vanc, Zosyn, and Levaquin. Her cultures have showed gram + cocci. Due to persistent tachycardia, fevers, and overall medical problems, [facility].   Patients drain continued to have no output, thus  on 01/15/14 the drain was upsized.  Her drain has started to drain small amounts of purulent drainage.  Her pain has significantly improved.  Her WBC is trending down now normal at 9.6 and she has been afebrile.  Diet was advanced as tolerated.  On HD #5, the patient was voiding well, tolerating diet, ambulating well, pain well controlled, vital signs stable, incisions c/d/i and felt stable for discharge home.  Patient will follow up in our office in 2-3 weeks and knows to call with questions or concerns.  Will send home with 2 weeks of Augmentin BID and clindamycin TID since culture results are not back yet.  Will schedule with IR drain clinic in 1-2 weeks as well.    Culture results thus far: ABUNDANT WBC PRESENT,BOTH PMN AND MONONUCLEAR NO SQUAMOUS EPITHELIAL CELLS SEEN ABUNDANT GRAM POSITIVE COCCI IN PAIRS FEW GRAM NEGATIVE RODS FEW GRAM NEGATIVE COCCI     Physical Exam: General:  Alert, NAD, pleasant, comfortable Abd:  Soft, ND, mild tenderness, RLQ IR perc drain with minimal yellow purulent-serous drainage    Medication List         acetaminophen 325 MG tablet  Commonly known as:  TYLENOL  Take 650 mg by mouth every 6 (six) hours as needed for moderate pain.     amoxicillin-clavulanate 875-125 MG per tablet  Commonly known as:  AUGMENTIN  Take 1 tablet by mouth 2 (two) times daily.     bisacodyl 10 MG suppository  Commonly known as:  DULCOLAX  Place 1 suppository (10 mg total) rectally daily as needed for moderate constipation.     clindamycin 300 MG capsule  Commonly  known as:  CLEOCIN  Take 1 capsule (300 mg total) by mouth 3 (three) times daily.     HYDROcodone-acetaminophen 5-325 MG per tablet  Commonly known as:  NORCO/VICODIN  Take 1-2 tablets by mouth every 4 (four) hours as needed for moderate pain or severe pain.         Follow-up Information   Follow up with [facility] E, MD. Schedule an appointment as soon as possible for a visit in 2 weeks. (For  post-hospital follow up)    Specialty:  General Surgery   Contact information:   [place] [address] [phone]       Follow up with [facility]. (HH-RN)    Contact information:   [address] [place] [address] [phone]       Follow up with [facility] On 01/25/2014. (For post-hospital follow up regarding your drain)    Specialty:  Radiology   Contact information:   [address] [id number] [place] [address] [phone]      Signed: Excell Seltzer [facility] [phone]  01/17/2014, 11:42 AM

## 2014-01-17 NOTE — Progress Notes (Signed)
Discussed D/C instructions with pt and father. Both verbalize understanding and have no further questions at this time. Prescriptions given. IV removed and percutaneous drain dressing changed.

## 2014-01-17 NOTE — Care Management Note (Signed)
    Page 1 of 1   01/17/2014     11:38:49 AM CARE MANAGEMENT NOTE 01/17/2014  Patient:  Jamie [name], Jamie [name]   Account Number:  [id number]  Date Initiated:  01/17/2014  Documentation initiated by:  Marvetta Gibbons  Subjective/Objective Assessment:   Pt admitted with ruptured appendix and PNA     Action/Plan:   PTA pt lived at home with parents   Anticipated DC Date:  01/17/2014   Anticipated DC Plan:  [place]  CM consult      [place]   Choice offered to / List presented to:  NA        HH arranged  HH-1 RN      [place].   Status of service:  Completed, signed off Medicare Important Message given?  NO (If response is "NO", the following Medicare IM given date fields will be blank) Date Medicare IM given:   Medicare IM given by:   Date Additional Medicare IM given:   Additional Medicare IM given by:    Discharge Disposition:  [place]  Per UR Regulation:  Reviewed for med. necessity/level of care/duration of stay  If discussed at [place] of Stay Meetings, dates discussed:    Comments:  01/17/14- [place] RN, BSN [phone] Pt for possible d/c later today, order for HH-RN for drain management- in to speak with pt at bedside- per conversation pt agreeable to [facility] also spoke with mother Caren Griffins) via TC- who pt lives with- and explained [place] needs for drain management- pt to go home on po abx- and states that she feels that she can manage cost- referral for HH-RN called to Butch Penny with [facility] (pt self pay)

## 2014-01-17 NOTE — Progress Notes (Signed)
Utilization review completed.  

## 2014-01-17 NOTE — Progress Notes (Signed)
Agree.  Minimal drain output.  Consider follow up CT soon.  If discharged with drain, can set up follow up through [facility] as outpatient.

## 2014-01-19 LAB — CULTURE, ROUTINE-ABSCESS

## 2014-01-24 ENCOUNTER — Other Ambulatory Visit (HOSPITAL_COMMUNITY): Payer: Self-pay | Admitting: Interventional Radiology

## 2014-01-24 ENCOUNTER — Ambulatory Visit (HOSPITAL_COMMUNITY)
Admission: RE | Admit: 2014-01-24 | Discharge: 2014-01-24 | Disposition: A | Discharge status: Home / Self Care | Payer: Medicaid Other | Source: Ambulatory Visit | Attending: Interventional Radiology | Admitting: Interventional Radiology

## 2014-01-24 ENCOUNTER — Ambulatory Visit (HOSPITAL_COMMUNITY): Admit: 2014-01-24 | Payer: Medicaid Other

## 2014-01-24 DIAGNOSIS — L0291 Cutaneous abscess, unspecified: Secondary | ICD-10-CM

## 2014-01-24 DIAGNOSIS — K51914 Ulcerative colitis, unspecified with abscess: Secondary | ICD-10-CM | POA: Insufficient documentation

## 2014-01-24 MED ORDER — IOHEXOL 300 MG/ML  SOLN
80.0000 mL | Freq: Once | INTRAMUSCULAR | Status: AC | PRN
  Administered 2014-01-24: 80 mL via INTRAVENOUS

## 2014-01-24 NOTE — Progress Notes (Signed)
Dressing to drain site applied, IV d/c'd pt given MD phone number for f/u, instructed to call for fever, inc pain or purulent discharge from drain site

## 2014-01-30 ENCOUNTER — Telehealth (INDEPENDENT_AMBULATORY_CARE_PROVIDER_SITE_OTHER): Payer: Self-pay

## 2014-01-30 NOTE — Telephone Encounter (Signed)
[facility] nurse unable to reach pt or her Mother.  States she has called several times and been by the residence.  I called that patient and spoke with her today.  I asked her to please call Domingo Mend, RN at [phone].  Pt agreed to do so.

## 2014-03-12 ENCOUNTER — Encounter (HOSPITAL_COMMUNITY): Payer: Self-pay

## 2014-03-12 ENCOUNTER — Emergency Department (HOSPITAL_COMMUNITY): Payer: Medicaid Other

## 2014-03-12 ENCOUNTER — Emergency Department (HOSPITAL_COMMUNITY)
Admission: EM | Admit: 2014-03-12 | Discharge: 2014-03-12 | Disposition: A | Discharge status: Home / Self Care | Payer: Medicaid Other | Attending: Emergency Medicine | Admitting: Emergency Medicine

## 2014-03-12 DIAGNOSIS — R079 Chest pain, unspecified: Secondary | ICD-10-CM | POA: Diagnosis present

## 2014-03-12 DIAGNOSIS — Z3202 Encounter for pregnancy test, result negative: Secondary | ICD-10-CM | POA: Diagnosis not present

## 2014-03-12 DIAGNOSIS — R109 Unspecified abdominal pain: Secondary | ICD-10-CM | POA: Diagnosis not present

## 2014-03-12 DIAGNOSIS — R110 Nausea: Secondary | ICD-10-CM | POA: Diagnosis not present

## 2014-03-12 DIAGNOSIS — R0789 Other chest pain: Secondary | ICD-10-CM | POA: Diagnosis not present

## 2014-03-12 DIAGNOSIS — Z8669 Personal history of other diseases of the nervous system and sense organs: Secondary | ICD-10-CM | POA: Diagnosis not present

## 2014-03-12 DIAGNOSIS — Z720 Tobacco use: Secondary | ICD-10-CM | POA: Diagnosis not present

## 2014-03-12 LAB — CBC
HEMATOCRIT: 34.9 % — AB (ref 36.0–46.0)
HEMOGLOBIN: 10.7 g/dL — AB (ref 12.0–15.0)
MCH: 22.8 pg — ABNORMAL LOW (ref 26.0–34.0)
MCHC: 30.7 g/dL (ref 30.0–36.0)
MCV: 74.4 fL — AB (ref 78.0–100.0)
Platelets: 301 10*3/uL (ref 150–400)
RBC: 4.69 MIL/uL (ref 3.87–5.11)
RDW: 16.3 % — ABNORMAL HIGH (ref 11.5–15.5)
WBC: 6.1 10*3/uL (ref 4.0–10.5)

## 2014-03-12 LAB — BASIC METABOLIC PANEL
Anion gap: 13 (ref 5–15)
BUN: 13 mg/dL (ref 6–23)
CALCIUM: 9.1 mg/dL (ref 8.4–10.5)
CO2: 21 meq/L (ref 19–32)
Chloride: 101 mEq/L (ref 96–112)
Creatinine, Ser: 0.76 mg/dL (ref 0.50–1.10)
GFR calc Af Amer: >90 mL/min (ref >90)
GFR calc non Af Amer: >90 mL/min (ref >90)
GLUCOSE: 77 mg/dL (ref 70–99)
Potassium: 3.8 mEq/L (ref 3.7–5.3)
SODIUM: 135 meq/L — AB (ref 137–147)

## 2014-03-12 LAB — I-STAT TROPONIN, ED: Troponin i, poc: 0 ng/mL (ref 0.00–0.08)

## 2014-03-12 LAB — POC URINE PREG, ED: PREG TEST UR: NEGATIVE

## 2014-03-12 MED ORDER — MORPHINE SULFATE 4 MG/ML IJ SOLN
4.0000 mg | Freq: Once | INTRAMUSCULAR | Status: AC
  Administered 2014-03-12: 4 mg via INTRAVENOUS
  Filled 2014-03-12: qty 1

## 2014-03-12 MED ORDER — IOHEXOL 300 MG/ML  SOLN
100.0000 mL | Freq: Once | INTRAMUSCULAR | Status: AC | PRN
  Administered 2014-03-12: 100 mL via INTRAVENOUS

## 2014-03-12 MED ORDER — IOHEXOL 300 MG/ML  SOLN
25.0000 mL | INTRAMUSCULAR | Status: AC
  Administered 2014-03-12: 25 mL via ORAL

## 2014-03-12 MED ORDER — ONDANSETRON HCL 4 MG/2ML IJ SOLN
4.0000 mg | Freq: Once | INTRAMUSCULAR | Status: AC
  Administered 2014-03-12: 4 mg via INTRAVENOUS
  Filled 2014-03-12: qty 2

## 2014-03-12 NOTE — ED Notes (Addendum)
Pt. Was diagnosed  Beginning of November with ruptured appendics.  She was hospitalized for 1 week at [facility] and was transferred to [place].   Pt. Still has her appendics. She also was diagnosed with pneumonia and fluid around the heart.  Pt. Has not felt well since then.  She had an abdominal drain due to an abscess.  She is here to day with abdominal pain, chest pain, and nausea.    She also pain in  her rt,. Wrist. Pt. Also believes that she is pregnant.

## 2014-03-12 NOTE — ED Notes (Signed)
CT notified that pt is finished with oral contrast. 

## 2014-03-12 NOTE — Discharge Instructions (Signed)
Your evaluated today in the ED for your chest discomfort and abdominal discomfort. There was no emergent source identified for your symptoms. Your chest discomfort is likely musculoskeletal in nature and you may continue to treat it symptomatically with your medications at home. Your abdominal discomfort is likely due to the large amount of stool you have in your colon. Please follow-up with your surgeon for your regularly scheduled appointment. Please return to ED for further evaluation if your symptoms worsen, you experience fevers, increased abdominal pain, chest pain, shortness of breath Abdominal Pain, Women Abdominal (stomach, pelvic, or belly) pain can be caused by many things. It is important to tell your doctor:  The location of the pain.  Does it come and go or is it present all the time?  Are there things that start the pain (eating certain foods, exercise)?  Are there other symptoms associated with the pain (fever, nausea, vomiting, diarrhea)? All of this is helpful to know when trying to find the cause of the pain. CAUSES   Stomach: virus or bacteria infection, or ulcer.  Intestine: appendicitis (inflamed appendix), regional ileitis (Crohn's disease), ulcerative colitis (inflamed colon), irritable bowel syndrome, diverticulitis (inflamed diverticulum of the colon), or cancer of the stomach or intestine.  Gallbladder disease or stones in the gallbladder.  Kidney disease, kidney stones, or infection.  Pancreas infection or cancer.  Fibromyalgia (pain disorder).  Diseases of the female organs:  Uterus: fibroid (non-cancerous) tumors or infection.  Fallopian tubes: infection or tubal pregnancy.  Ovary: cysts or tumors.  Pelvic adhesions (scar tissue).  Endometriosis (uterus lining tissue growing in the pelvis and on the pelvic organs).  Pelvic congestion syndrome (female organs filling up with blood just before the menstrual period).  Pain with the menstrual  period.  Pain with ovulation (producing an egg).  Pain with an IUD (intrauterine device, birth control) in the uterus.  Cancer of the female organs.  Functional pain (pain not caused by a disease, may improve without treatment).  Psychological pain.  Depression. DIAGNOSIS  Your doctor will decide the seriousness of your pain by doing an examination.  Blood tests.  X-rays.  Ultrasound.  CT scan (computed tomography, special type of X-ray).  MRI (magnetic resonance imaging).  Cultures, for infection.  Barium enema (dye inserted in the large intestine, to better view it with X-rays).  Colonoscopy (looking in intestine with a lighted tube).  Laparoscopy (minor surgery, looking in abdomen with a lighted tube).  Major abdominal exploratory surgery (looking in abdomen with a large incision). TREATMENT  The treatment will depend on the cause of the pain.   Many cases can be observed and treated at home.  Over-the-counter medicines recommended by your caregiver.  Prescription medicine.  Antibiotics, for infection.  Birth control pills, for painful periods or for ovulation pain.  Hormone treatment, for endometriosis.  Nerve blocking injections.  Physical therapy.  Antidepressants.  Counseling with a psychologist or psychiatrist.  Minor or major surgery. HOME CARE INSTRUCTIONS   Do not take laxatives, unless directed by your caregiver.  Take over-the-counter pain medicine only if ordered by your caregiver. Do not take aspirin because it can cause an upset stomach or bleeding.  Try a clear liquid diet (broth or water) as ordered by your caregiver. Slowly move to a bland diet, as tolerated, if the pain is related to the stomach or intestine.  Have a thermometer and take your temperature several times a day, and record it.  Bed rest and sleep, if it helps  the pain.  Avoid sexual intercourse, if it causes pain.  Avoid stressful situations.  Keep your  follow-up appointments and tests, as your caregiver orders.  If the pain does not go away with medicine or surgery, you may try:  Acupuncture.  Relaxation exercises (yoga, meditation).  Group therapy.  Counseling. SEEK MEDICAL CARE IF:   You notice certain foods cause stomach pain.  Your home care treatment is not helping your pain.  You need stronger pain medicine.  You want your IUD removed.  You feel faint or lightheaded.  You develop nausea and vomiting.  You develop a rash.  You are having side effects or an allergy to your medicine. SEEK IMMEDIATE MEDICAL CARE IF:   Your pain does not go away or gets worse.  You have a fever.  Your pain is felt only in portions of the abdomen. The right side could possibly be appendicitis. The left lower portion of the abdomen could be colitis or diverticulitis.  You are passing blood in your stools (bright red or black tarry stools, with or without vomiting).  You have blood in your urine.  You develop chills, with or without a fever.  You pass out. MAKE SURE YOU:   Understand these instructions.  Will watch your condition.  Will get help right away if you are not doing well or get worse. Document Released: 01/26/2007 Document Revised: 08/15/2013 Document Reviewed: 02/15/2009 [facility] Patient Information 2015 [place], [place]. This information is not intended to replace advice given to you by your health care provider. Make sure you discuss any questions you have with your health care provider.  Chest Pain (Nonspecific) It is often hard to give a specific diagnosis for the cause of chest pain. There is always a chance that your pain could be related to something serious, such as a heart attack or a blood clot in the lungs. You need to follow up with your health care provider for further evaluation. CAUSES   Heartburn.  Pneumonia or bronchitis.  Anxiety or stress.  Inflammation around your heart (pericarditis) or  lung (pleuritis or pleurisy).  A blood clot in the lung.  A collapsed lung (pneumothorax). It can develop suddenly on its own (spontaneous pneumothorax) or from trauma to the chest.  Shingles infection (herpes zoster virus). The chest wall is composed of bones, muscles, and cartilage. Any of these can be the source of the pain.  The bones can be bruised by injury.  The muscles or cartilage can be strained by coughing or overwork.  The cartilage can be affected by inflammation and become sore (costochondritis). DIAGNOSIS  Lab tests or other studies may be needed to find the cause of your pain. Your health care provider may have you take a test called an ambulatory electrocardiogram (ECG). An ECG records your heartbeat patterns over a 24-hour period. You may also have other tests, such as:  Transthoracic echocardiogram (TTE). During echocardiography, sound waves are used to evaluate how blood flows through your heart.  Transesophageal echocardiogram (TEE).  Cardiac monitoring. This allows your health care provider to monitor your heart rate and rhythm in real time.  Holter monitor. This is a portable device that records your heartbeat and can help diagnose heart arrhythmias. It allows your health care provider to track your heart activity for several days, if needed.  Stress tests by exercise or by giving medicine that makes the heart beat faster. TREATMENT   Treatment depends on what may be causing your chest pain. Treatment may  include:  Acid blockers for heartburn.  Anti-inflammatory medicine.  Pain medicine for inflammatory conditions.  Antibiotics if an infection is present.  You may be advised to change lifestyle habits. This includes stopping smoking and avoiding alcohol, caffeine, and chocolate.  You may be advised to keep your head raised (elevated) when sleeping. This reduces the chance of acid going backward from your stomach into your esophagus. Most of the time,  nonspecific chest pain will improve within 2-3 days with rest and mild pain medicine.  HOME CARE INSTRUCTIONS   If antibiotics were prescribed, take them as directed. Finish them even if you start to feel better.  For the next few days, avoid physical activities that bring on chest pain. Continue physical activities as directed.  Do not use any tobacco products, including cigarettes, chewing tobacco, or electronic cigarettes.  Avoid drinking alcohol.  Only take medicine as directed by your health care provider.  Follow your health care provider's suggestions for further testing if your chest pain does not go away.  Keep any follow-up appointments you made. If you do not go to an appointment, you could develop lasting (chronic) problems with pain. If there is any problem keeping an appointment, call to reschedule. SEEK MEDICAL CARE IF:   Your chest pain does not go away, even after treatment.  You have a rash with blisters on your chest.  You have a fever. SEEK IMMEDIATE MEDICAL CARE IF:   You have increased chest pain or pain that spreads to your arm, neck, jaw, back, or abdomen.  You have shortness of breath.  You have an increasing cough, or you cough up blood.  You have severe back or abdominal pain.  You feel nauseous or vomit.  You have severe weakness.  You faint.  You have chills. This is an emergency. Do not wait to see if the pain will go away. Get medical help at once. Call your local emergency services (911 in U.[place] Do not drive yourself to the hospital. MAKE SURE YOU:   Understand these instructions.  Will watch your condition.  Will get help right away if you are not doing well or get worse. Document Released: 01/08/2005 Document Revised: 04/05/2013 Document Reviewed: 11/04/2007 [facility] Patient Information 2015 [place], [place]. This information is not intended to replace advice given to you by your health care provider. Make sure you discuss any  questions you have with your health care provider.  Emergency Department Resource Guide 1) Find a Doctor and Pay Out of Pocket Although you won't have to find out who is covered by your insurance plan, it is a good idea to ask around and get recommendations. You will then need to call the office and see if the doctor you have chosen will accept you as a new patient and what types of options they offer for patients who are self-pay. Some doctors offer discounts or will set up payment plans for their patients who do not have insurance, but you will need to ask so you aren't surprised when you get to your appointment.  2) Contact Your Local Health Department Not all health departments have doctors that can see patients for sick visits, but many do, so it is worth a call to see if yours does. If you don't know where your local health department is, you can check in your phone book. The CDC also has a tool to help you locate your state's health department, and many state websites also have listings of all of their  local health departments.  3) Find a [facility] If your illness is not likely to be very severe or complicated, you may want to try a walk in clinic. These are popping up all over the country in pharmacies, drugstores, and shopping centers. They're usually staffed by nurse practitioners or physician assistants that have been trained to treat common illnesses and complaints. They're usually fairly quick and inexpensive. However, if you have serious medical issues or chronic medical problems, these are probably not your best option.  No Primary Care Doctor: - Call Health Connect at  [phone] - they can help you locate a primary care doctor that  accepts your insurance, provides certain services, etc. - Physician Referral Service- [phone]  Chronic Pain Problems: Organization         Address  Phone   Notes  [facility]  [phone] Patients need to be referred by  their primary care doctor.   Medication Assistance: Organization         Address  Phone   Notes  [facility] Medication [facility] [address] [place], [place] [address] [phone] --Must be a resident of [facility] -- Must have NO insurance coverage whatsoever (no Medicaid/ Medicare, etc.) -- The pt. MUST have a primary care doctor that directs their care regularly and follows them in the community   MedAssist  [phone]   [employer]  [phone]    Agencies that provide inexpensive medical care: Organization         Address  Phone   Notes  [place]  [phone]   [facility]    [phone]   [facility] [place], [place] [address] [phone]   [place] [address] [place] [phone]   Planned Parenthood    [phone]   [facility]    [phone]   [employer] and [address], [place] Phone:  [phone], Fax:  [phone] Hours of Operation:  9 am - 6 pm, M-F.  Also accepts Medicaid/Medicare and self-pay.  [facility] for [place], [address], [place] Phone: [phone], Fax: [phone]. Hours of Operation:  8:30 am - 5:30 pm, M-F.  Also accepts Medicaid and self-pay.  [facility] [address] [place] Phone: [phone]   [place], [place], [place] [phone], Ext. [phone] Mondays & Thursdays: 7-9 AM.  First 15 patients are seen on a first come, first serve basis.    [place] Providers:  Organization         Address  Phone   Notes  [facility] [address], [place] [phone] Also accepts self-pay patients.  [facility] [address], [place]  [phone]   [place], [address], [place] [phone]   [facility] [address], [place] [phone]   Lucianne Lei [address], [place]   [phone] Only accepts [place] Access [place] patients after they have their name applied to their card.   Self-Pay (no insurance) in [facility]:  Organization         Address  Phone   Notes  Sickle Cell Patients,  [place] [phone]   [facility] [place] [phone]   [facility] [place]  [place], [place], [place] [phone]   [facility]/Dr. Osei-Bonsu  [address] [place] or [address], [place] [phone] Phone number for both [place] and [place] locations is the same.  Urgent Medical and [facility] [address] [place] [phone]   [facility] [address] [place] or [address] [phone]   [facility] [address], [place] [phone], phone; [phone], fax Sees patients 1st and 3rd Saturday of every month.  Must not qualify for public or private insurance (i.e. Medicaid, Medicare, [place] Health Choice, Veterans' Benefits)  Household income should be no more than 200% of the poverty level The clinic cannot treat you if you are pregnant or think you are pregnant  Sexually transmitted diseases are not treated at the clinic.    Dental Care: Organization         Address  Phone  Notes  [facility] Department of [facility] [place] [phone] Accepts children up to age 51 who are enrolled in [place] or [place]; pregnant women with a Medicaid card; and children who have applied for Medicaid or [place] Health Choice, but were declined, whose parents can pay a reduced  fee at time of service.  [facility] Department of [facility]  [address], [place] [phone] Accepts children up to age 74 who are enrolled in [place] or [place]; pregnant women with a Medicaid card; and children who have applied for Medicaid or [place] Health Choice, but were declined, whose parents can pay a reduced fee at time of service.  [place] Adult Dental Access PROGRAM  [place] [phone] Patients are seen by appointment only. Walk-ins are not accepted. [place] will see patients 70 years of age and older. Monday - Tuesday (8am-5pm) Most Wednesdays (8:30-5pm) $30 per visit, cash only  [facility] Adult Dental Access PROGRAM  [address], [facility] [phone] Patients are seen by appointment only. Walk-ins are not accepted. [place] will see patients 4 years of age and older. One Wednesday Evening (Monthly: Volunteer Based).  $30 per visit, cash only  [place]  [phone] for adults; Children under age 75, call Graduate Pediatric Dentistry at [phone]. Children aged 35-14, please call [phone] to request a pediatric application.  Dental services are provided in all areas of dental care including fillings, crowns and bridges, complete and partial dentures, implants, gum treatment, root canals, and extractions. Preventive care is also provided. Treatment is provided to both adults and children. Patients are selected via a lottery and there is often a waiting list.   [facility] [address], [place]  [phone] [email or web address]   [facility] [address] [place], [place] [phone], Ext. [phone] Second and Fourth Thursday of each month, opens at 6:30 AM; Clinic ends at 9 AM.  Patients are seen on a first-come first-served basis, and a limited number are seen during each clinic.   [facility]  [address] Hillard Danker [place], [place] [phone]   Eligibility Requirements You must  have lived in [place], [place], or [place] counties for at least the last three months.   You cannot be eligible for state or federal sponsored [employer], including [employer], [place], or [employer].   You generally cannot be eligible for healthcare insurance through your employer.    How to apply: Eligibility screenings are held every Tuesday and Wednesday afternoon from 1:00 pm until 4:00 pm. You do not need an appointment for the interview!  [facility] [address], [place], [place]   [place]  [place] Department  [place]  [phone]    Behavioral Health Resources in the Community: Intensive Outpatient Programs Organization         Address  Phone  Notes  [facility] [place]. [address] [place], [place] [phone]   [facility] Outpatient [address], [place], [place]   ADS: Alcohol & Drug Svcs [address], [place], [place]   [place] [address]  [place], [place] or [phone]   Substance Abuse Resources Organization         Address  Phone  Notes  Alcohol and Drug Services  [phone]   [place]  [phone]   [facility] [place]   Chinita Pester  [phone]   Residential & Outpatient Substance Abuse Program  [phone]   Psychological Services Organization         Address  Phone  Notes  [facility] [place]  [place]  [phone]   [place] [address] [place] or [phone]    Mobile Crisis Teams Organization         Address  Phone  Notes  [facility], Mobile Crisis Care Unit  [phone]   Assertive Psychotherapeutic  Services  [address] [place], [place]   [place] [address] [place] [phone]    Self-Help/Support Groups Organization         Address  Phone             Notes  [place]. of [place] - variety of support groups  [place] Call for more information  Narcotics Anonymous (NA), [facility]aring [facility] [address], [employer] [place]  2 meetings at this location   Special educational needs teacher         Address  Phone  Notes  ASAP Residential Treatment [place],    [place]  [phone]   [facility]  [address] [place] [address]242, [place], [place]   [place], [place] [phone] Admissions: 8am-3pm M-F  Incentives Substance [place] [address],    [place], [place] [phone]   [facility] [address] Jadene Pierini [place], [place]   [facility] [address],  [place], [place]   Insight Programs - Intensive Outpatient [address] Kristeen [address], [place], [place]   [facility] ([place].,  [place], [place] or [phone]   Residential Treatment Services (RTS) [address], [place], [place] Accepts Medicaid  [facility] [place] [address]  [place] [phone] Substance Abuse/Addiction Treatment   [facility] Resources Organization         Address  Phone  Notes  [facility]  [phone]   Domenic Schwab, PhD [address] Arlis Porta [place], [place]   [phone] or [phone]   [place], [place] [phone]   [place] [address], [place], [place] [phone] Insurance/Medicaid/sponsorship through [facility] and Families [address]                                    [place], [place] [phone] [place] [address], [place] [phone]    Dr. Adele Schilder  [phone]   [facility] [place] Dept. 1) [address] [address] [place] 2) [place] 3)  [place] [address], [place] [phone]  [phone]   [place] [phone] or [phone] (After Hours)

## 2014-03-12 NOTE — ED Provider Notes (Signed)
CSN: [id number]     Arrival date & time 03/12/14  1200 History   First MD Initiated Contact with Patient 03/12/14 1220     Chief Complaint  Patient presents with  . Chest Pain    abdominal pain     (Consider location/radiation/quality/duration/timing/severity/associated sxs/prior Treatment) HPI Jamie [name] is a 23 y.o. female who was seen on 9/29 at [facility] with right lower quadrant pain, CT showed ruptured appendix with well-developed abscess. She was also found to have bilateral pneumonias. She had a drain placed by IR and was treated with IV antibiotics transferred to [facility] on 10/2. Patient was discharged on 10/6 to follow-up with Dr. Georganna Skeans, but patient reports she will not see him until December 9.  Today she reports abdominal discomfort similar to what she experienced before. She denies fevers, vomiting, diarrhea, constipation, vaginal bleeding or discharge. She does endorse nausea. She is also concerned she may be pregnant. She is also concerned about a chest pain that she has had for 2 weeks. She characterizes the pain as a sharp stabbing sensation that lasts for approximately 5 minutes and has been intermittent over the past 2 weeks. The chest pain is worse with certain movements. No associated shortness of breath or diaphoresis.  Past Medical History  Diagnosis Date  . Seizure    History reviewed. No pertinent past surgical history. No family history on file. History  Substance Use Topics  . Smoking status: Current Every Day Smoker    Types: Cigarettes  . Smokeless tobacco: Not on file  . Alcohol Use: Yes   OB History    No data available     Review of Systems  Constitutional: Negative for fever.  HENT: Negative for sore throat.   Eyes: Negative for visual disturbance.  Respiratory: Negative for shortness of breath.   Cardiovascular: Positive for chest pain.  Gastrointestinal: Positive for nausea and abdominal pain.  Endocrine: Negative for  polyuria.  Genitourinary: Negative for dysuria, vaginal discharge and pelvic pain.  Skin: Negative for rash.  Neurological: Negative for headaches.      Allergies  Review of patient's allergies indicates no known allergies.  Home Medications   Prior to Admission medications   Medication Sig Start Date End Date Taking? Authorizing Provider  acetaminophen (TYLENOL) 325 MG tablet Take 650 mg by mouth every 6 (six) hours as needed for moderate pain.    Historical Provider, MD  amoxicillin-clavulanate (AUGMENTIN) 875-125 MG per tablet Take 1 tablet by mouth 2 (two) times daily. Patient not taking: Reported on 03/12/2014 01/17/14   Megan N Dort, PA-C  bisacodyl (DULCOLAX) 10 MG suppository Place 1 suppository (10 mg total) rectally daily as needed for moderate constipation. Patient not taking: Reported on 03/12/2014 01/17/14   Megan N Dort, PA-C  clindamycin (CLEOCIN) 300 MG capsule Take 1 capsule (300 mg total) by mouth 3 (three) times daily. Patient not taking: Reported on 03/12/2014 01/17/14   Nehemiah Massed Dort, PA-C  HYDROcodone-acetaminophen (NORCO/VICODIN) 5-325 MG per tablet Take 1-2 tablets by mouth every 4 (four) hours as needed for moderate pain or severe pain. Patient not taking: Reported on 03/12/2014 01/17/14   Megan N Dort, PA-C   BP 106/70 mmHg  Pulse 83  Temp(Src) 98.5 F (36.9 C) (Oral)  Resp 16  SpO2 100%  LMP 12/07/2013 (LMP Unknown) Physical Exam  Constitutional: She is oriented to person, place, and time. She appears well-developed and well-nourished.  HENT:  Head: Normocephalic and atraumatic.  Mouth/Throat: Oropharynx is clear and  moist.  Eyes: Conjunctivae are normal. Pupils are equal, round, and reactive to light. Right eye exhibits no discharge. Left eye exhibits no discharge. No scleral icterus.  Neck: Neck supple.  Cardiovascular: Normal rate, regular rhythm, normal heart sounds and intact distal pulses.  Exam reveals no gallop and no friction rub.   No murmur  heard. There is TTP of the sternum and intercostal muscles.  Pulmonary/Chest: Effort normal and breath sounds normal. No respiratory distress. She has no wheezes. She has no rales.  Abdominal: Soft. Bowel sounds are normal.  Abdomen is soft, nondistended. There is diffuse tenderness in all quadrants with no focal tenderness. No rebound, guarding or palpable masses appreciated. Previous abscess drainage site appears to be healing well with no signs of erythema or overt warmth.  Musculoskeletal: She exhibits no tenderness.  Patient ambulates about the ED without any difficulty  Neurological: She is alert and oriented to person, place, and time.  Cranial Nerves II-XII grossly intact  Skin: Skin is warm and dry. No rash noted.  Psychiatric: She has a normal mood and affect.  Nursing note and vitals reviewed.   ED Course  Procedures (including critical care time) Labs Review Labs Reviewed  BASIC METABOLIC PANEL - Abnormal; Notable for the following:    Sodium 135 (*)    All other components within normal limits  CBC - Abnormal; Notable for the following:    Hemoglobin 10.7 (*)    HCT 34.9 (*)    MCV 74.4 (*)    MCH 22.8 (*)    RDW 16.3 (*)    All other components within normal limits  I-STAT TROPOININ, ED  POC URINE PREG, ED    Imaging Review Dg Chest 2 View  03/12/2014   CLINICAL DATA:  Chest pain and shortness of breath for 2 weeks, history of smoking  EXAM: CHEST  2 VIEW  COMPARISON:  01/13/2014  FINDINGS: Cardiomediastinal silhouette is stable. No acute infiltrate or pleural effusion. No pulmonary edema. Bony thorax is unremarkable.  IMPRESSION: No active cardiopulmonary disease.   Electronically Signed   By: Lahoma Crocker M.D.   On: 03/12/2014 13:46   Ct Abdomen Pelvis W Contrast  03/12/2014   CLINICAL DATA:  Persistent right lower quadrant pain, history of rupture of appendix, abscess drainage in 10/15  EXAM: CT ABDOMEN AND PELVIS WITH CONTRAST  TECHNIQUE: Multidetector CT  imaging of the abdomen and pelvis was performed using the standard protocol following bolus administration of intravenous contrast.  CONTRAST:  148mL OMNIPAQUE IOHEXOL 300 MG/ML  SOLN  COMPARISON:  01/24/2014  FINDINGS: Lung bases are unremarkable. Sagittal images of the spine are unremarkable.  Liver, spleen, pancreas and adrenals are unremarkable.  Kidneys are symmetrical in size and enhancement. No hydronephrosis or hydroureter.  Abdominal aorta is unremarkable. Abundant colonic stool noted throughout the colon.  There is no residual pericecal inflammation. No mesenteric abscess or fluid collection. Normal appearing ovary is located in right lower quadrant. Minimal congestion of right adnexal vessels. Left ovary and left adnexa is unremarkable. The uterus is unremarkable. The urinary bladder is unremarkable. Moderate stool noted within rectum. The rectum measures 4.8 cm in diameter.  No colitis or diverticulitis.  No thickening of colonic wall.  IMPRESSION: 1. There is no residual pericecal inflammation. No mesenteric abscess is noted. Normal appearing right ovary located in right lower quadrant. Minimal congestion of right adnexal vessels. 2. Abundant colonic stool. Moderate stool noted within rectum. The rectum measures 4.8 cm in diameter. 3. No small bowel  obstruction.  No ascites or free air.   Electronically Signed   By: Lahoma Crocker M.D.   On: 03/12/2014 17:04     EKG Interpretation None     Recheck at 1500, patient still has not pleaded oral contrast, lying in bed in no apparent distress playing on phone watching TV. Recheck 15:37, pt still has not finished contrast, playing on phone. MDM  Jamie [name] is a 23 y.o. female with recent admission for ruptured appendix and appendiceal abscess, pneumonia presents with abdominal pain and chest pain.  Vitals stable - WNL -afebrile Pt resting comfortably in ED. laughing and playing with family in room. No apparent distress PE--no evidence of acute  or emergent pathology. No evidence of acute abdomen, no peritoneal signs, no evidence of infection around drainage removal site  Labwork noncontributory. Troponin negative. EKG not concerning Imaging--chest x-ray shows no acute cardiopulmonary pathology. CT abdomen shows moderate stool burden but no other acute intra-abdominal pathology.  DDX--low concern for any emergent pathology at this time. No evidence of ACS, PE, pneumothorax, pneumonia Patient refuses any prescription medications. Discussed f/u with PCP and return precautions, pt very amenable to plan. Patient stable, in good condition and is appropriate for discharge   Final diagnoses:  Abdominal discomfort  Chest discomfort        Verl Dicker, PA-C 03/12/14 1724  Charlesetta Shanks, MD 03/14/14 1217

## 2014-08-16 ENCOUNTER — Encounter (HOSPITAL_COMMUNITY): Payer: Self-pay | Admitting: *Deleted

## 2014-08-16 ENCOUNTER — Emergency Department (HOSPITAL_COMMUNITY)
Admission: EM | Admit: 2014-08-16 | Discharge: 2014-08-16 | Disposition: A | Discharge status: Home / Self Care | Payer: Medicaid Other | Attending: Emergency Medicine | Admitting: Emergency Medicine

## 2014-08-16 DIAGNOSIS — M549 Dorsalgia, unspecified: Secondary | ICD-10-CM | POA: Diagnosis present

## 2014-08-16 DIAGNOSIS — Z720 Tobacco use: Secondary | ICD-10-CM | POA: Insufficient documentation

## 2014-08-16 DIAGNOSIS — Z3202 Encounter for pregnancy test, result negative: Secondary | ICD-10-CM | POA: Diagnosis not present

## 2014-08-16 DIAGNOSIS — R102 Pelvic and perineal pain: Secondary | ICD-10-CM | POA: Insufficient documentation

## 2014-08-16 DIAGNOSIS — R112 Nausea with vomiting, unspecified: Secondary | ICD-10-CM | POA: Insufficient documentation

## 2014-08-16 DIAGNOSIS — M545 Low back pain: Secondary | ICD-10-CM | POA: Diagnosis not present

## 2014-08-16 DIAGNOSIS — Z792 Long term (current) use of antibiotics: Secondary | ICD-10-CM | POA: Diagnosis not present

## 2014-08-16 LAB — URINALYSIS, ROUTINE W REFLEX MICROSCOPIC
Bilirubin Urine: NEGATIVE
GLUCOSE, UA: NEGATIVE mg/dL
HGB URINE DIPSTICK: NEGATIVE
KETONES UR: 15 mg/dL — AB
Nitrite: NEGATIVE
PROTEIN: NEGATIVE mg/dL
Specific Gravity, Urine: 1.034 — ABNORMAL HIGH (ref 1.005–1.030)
Urobilinogen, UA: 1 mg/dL (ref 0.0–1.0)
pH: 6 (ref 5.0–8.0)

## 2014-08-16 LAB — I-STAT BETA HCG BLOOD, ED (MC, WL, AP ONLY): I-stat hCG, quantitative: <5 m[IU]/mL (ref <5)

## 2014-08-16 LAB — URINE MICROSCOPIC-ADD ON

## 2014-08-16 LAB — WET PREP, GENITAL
Trich, Wet Prep: NONE SEEN
Yeast Wet Prep HPF POC: NONE SEEN

## 2014-08-16 LAB — POC URINE PREG, ED: Preg Test, Ur: NEGATIVE

## 2014-08-16 MED ORDER — ONDANSETRON 4 MG PO TBDP
8.0000 mg | ORAL_TABLET | Freq: Once | ORAL | Status: AC
  Administered 2014-08-16: 8 mg via ORAL
  Filled 2014-08-16: qty 2

## 2014-08-16 MED ORDER — NAPROXEN 500 MG PO TABS: 500.0000 mg | ORAL_TABLET | Freq: Two times a day (BID) | ORAL | Status: DC

## 2014-08-16 MED ORDER — ACETAMINOPHEN 325 MG PO TABS
650.0000 mg | ORAL_TABLET | Freq: Once | ORAL | Status: AC
  Administered 2014-08-16: 650 mg via ORAL
  Filled 2014-08-16: qty 2

## 2014-08-16 NOTE — ED Notes (Signed)
The pt is c/o lower back pain for 2 weeks. She reports that she is 2 mionths preg however her abd is larger than a 2 month preg.  No vaginal bleeding she sees a doctor in [place] moved here.  lmp  March ???????

## 2014-08-16 NOTE — ED Provider Notes (Signed)
CSN: [id number]     Arrival date & time 08/16/14  1717 History   First MD Initiated Contact with Patient 08/16/14 1748     Chief Complaint  Patient presents with  . Back Pain     (Consider location/radiation/quality/duration/timing/severity/associated sxs/prior Treatment) HPI Jamie [name] is a 24 y.o. female history of seizures, presents to emergency department complaining of lower back and lower abdominal cramping. Patient states that she skipped a period last month, states last period was sometimes in March. She states pain started approximately 2 weeks ago. She reports associated nausea and vomiting. States pain is mainly in the lower abdomen and radiates around to the lower back. Denies any urinary symptoms. No vaginal discharge or bleeding. Patient did not take a pregnancy test at home. She denies any blood in her stool. Last bowel movement was today. No blood in emesis. Denies any fever or chills. No other complaints.   Past Medical History  Diagnosis Date  . Seizure    History reviewed. No pertinent past surgical history. No family history on file. History  Substance Use Topics  . Smoking status: Current Every Day Smoker    Types: Cigarettes  . Smokeless tobacco: Not on file  . Alcohol Use: Yes   OB History    No data available     Review of Systems  Constitutional: Negative for fever and chills.  Respiratory: Negative for cough, chest tightness and shortness of breath.   Cardiovascular: Negative for chest pain, palpitations and leg swelling.  Gastrointestinal: Positive for nausea and vomiting. Negative for abdominal pain and diarrhea.  Genitourinary: Positive for pelvic pain. Negative for dysuria, flank pain, vaginal bleeding, vaginal discharge and vaginal pain.  Musculoskeletal: Positive for back pain. Negative for myalgias, arthralgias, neck pain and neck stiffness.  Skin: Negative for rash.  Neurological: Negative for dizziness, weakness and headaches.  All other  systems reviewed and are negative.     Allergies  Review of patient's allergies indicates no known allergies.  Home Medications   Prior to Admission medications   Medication Sig Start Date End Date Taking? Authorizing Provider  amoxicillin-clavulanate (AUGMENTIN) 875-125 MG per tablet Take 1 tablet by mouth 2 (two) times daily. Patient not taking: Reported on 03/12/2014 01/17/14   Megan N Dort, PA-C  bisacodyl (DULCOLAX) 10 MG suppository Place 1 suppository (10 mg total) rectally daily as needed for moderate constipation. Patient not taking: Reported on 03/12/2014 01/17/14   Megan N Dort, PA-C  clindamycin (CLEOCIN) 300 MG capsule Take 1 capsule (300 mg total) by mouth 3 (three) times daily. Patient not taking: Reported on 03/12/2014 01/17/14   Nehemiah Massed Dort, PA-C  HYDROcodone-acetaminophen (NORCO/VICODIN) 5-325 MG per tablet Take 1-2 tablets by mouth every 4 (four) hours as needed for moderate pain or severe pain. Patient not taking: Reported on 03/12/2014 01/17/14   Megan N Dort, PA-C   BP 97/55 mmHg  Pulse 77  Temp(Src) 98.1 F (36.7 C) (Oral)  Resp 18  Ht 5\' 4"  (1.626 m)  Wt 164 lb 2 oz (74.447 kg)  BMI 28.16 kg/m2  SpO2 93%  LMP 06/16/2014 Physical Exam  Constitutional: She is oriented to person, place, and time. She appears well-developed and well-nourished. No distress.  HENT:  Head: Normocephalic.  Eyes: Conjunctivae are normal.  Neck: Neck supple.  Cardiovascular: Normal rate, regular rhythm and normal heart sounds.   Pulmonary/Chest: Effort normal and breath sounds normal. No respiratory distress. She has no wheezes. She has no rales.  Abdominal: Soft. Bowel sounds  are normal. She exhibits no distension. There is tenderness. There is no rebound.  Suprapubic tenderness  Genitourinary:  Normal external genitalia. Normal vaginal canal. Small thin white discharge. Cervix is normal, closed. No CMT. No uterine or adnexal tenderness. No masses palpated.    Musculoskeletal:  She exhibits no edema.  Neurological: She is alert and oriented to person, place, and time.  Skin: Skin is warm and dry.  Psychiatric: She has a normal mood and affect. Her behavior is normal.  Nursing note and vitals reviewed.   ED Course  Procedures (including critical care time) Labs Review Labs Reviewed  WET PREP, GENITAL - Abnormal; Notable for the following:    Clue Cells Wet Prep HPF POC FEW (*)    WBC, Wet Prep HPF POC FEW (*)    All other components within normal limits  URINALYSIS, ROUTINE W REFLEX MICROSCOPIC - Abnormal; Notable for the following:    APPearance CLOUDY (*)    Specific Gravity, Urine 1.034 (*)    Ketones, ur 15 (*)    Leukocytes, UA TRACE (*)    All other components within normal limits  URINE MICROSCOPIC-ADD ON - Abnormal; Notable for the following:    Squamous Epithelial / LPF FEW (*)    Bacteria, UA FEW (*)    All other components within normal limits  POC URINE PREG, ED  I-STAT BETA HCG BLOOD, ED ([facility], [facility], [facility] ONLY)  GC/CHLAMYDIA PROBE AMP ([place])    Imaging Review No results found.   EKG Interpretation None      MDM   Final diagnoses:  Bilateral back pain, unspecified location  Pelvic pain in female    Patient emergency department with pelvic pain, back pain. For 2 weeks. Last menstrual cycle 2 months ago. She is unsure if she's pregnant. Will check urine pregnancy, urinalysis, pelvic exam done and is unremarkable, wet prep pending. Patient was given Tylenol and Zofran emergence department for her symptoms.  Filed Vitals:   08/16/14 1722 08/16/14 1810 08/16/14 1915 08/16/14 2019  BP: 109/70 99/65 97/55  110/57  Pulse: 87 77  78  Temp: 98.1 F (36.7 C)   98.7 F (37.1 C)  TempSrc: Oral   Oral  Resp: 18   18  Height: 5\' 4"  (1.626 m)     Weight: 164 lb 2 oz (74.447 kg)     SpO2: 100% 93%  100%     Jeannett Senior, PA-C 08/16/14 2244  Milton Ferguson, MD 08/17/14 1327

## 2014-08-16 NOTE — Discharge Instructions (Signed)
Please follow up with your OB/GYN  For further evaluation. Naprosyn for pain as prescribed.   Pelvic Pain Female pelvic pain can be caused by many different things and start from a variety of places. Pelvic pain refers to pain that is located in the lower half of the abdomen and between your hips. The pain may occur over a short period of time (acute) or may be reoccurring (chronic). The cause of pelvic pain may be related to disorders affecting the female reproductive organs (gynecologic), but it may also be related to the bladder, kidney stones, an intestinal complication, or muscle or skeletal problems. Getting help right away for pelvic pain is important, especially if there has been severe, sharp, or a sudden onset of unusual pain. It is also important to get help right away because some types of pelvic pain can be life threatening.  CAUSES  Below are only some of the causes of pelvic pain. The causes of pelvic pain can be in one of several categories.   Gynecologic.  Pelvic inflammatory disease.  Sexually transmitted infection.  Ovarian cyst or a twisted ovarian ligament (ovarian torsion).  Uterine lining that grows outside the uterus (endometriosis).  Fibroids, cysts, or tumors.  Ovulation.  Pregnancy.  Pregnancy that occurs outside the uterus (ectopic pregnancy).  Miscarriage.  Labor.  Abruption of the placenta or ruptured uterus.  Infection.  Uterine infection (endometritis).  Bladder infection.  Diverticulitis.  Miscarriage related to a uterine infection (septic abortion).  Bladder.  Inflammation of the bladder (cystitis).  Kidney stone(s).  Gastrointestinal.  Constipation.  Diverticulitis.  Neurologic.  Trauma.  Feeling pelvic pain because of mental or emotional causes (psychosomatic).  Cancers of the bowel or pelvis. EVALUATION  Your caregiver will want to take a careful history of your concerns. This includes recent changes in your health, a  careful gynecologic history of your periods (menses), and a sexual history. Obtaining your family history and medical history is also important. Your caregiver may suggest a pelvic exam. A pelvic exam will help identify the location and severity of the pain. It also helps in the evaluation of which organ system may be involved. In order to identify the cause of the pelvic pain and be properly treated, your caregiver may order tests. These tests may include:   A pregnancy test.  Pelvic ultrasonography.  An X-ray exam of the abdomen.  A urinalysis or evaluation of vaginal discharge.  Blood tests. HOME CARE INSTRUCTIONS   Only take over-the-counter or prescription medicines for pain, discomfort, or fever as directed by your caregiver.   Rest as directed by your caregiver.   Eat a balanced diet.   Drink enough fluids to make your urine clear or pale yellow, or as directed.   Avoid sexual intercourse if it causes pain.   Apply warm or cold compresses to the lower abdomen depending on which one helps the pain.   Avoid stressful situations.   Keep a journal of your pelvic pain. Write down when it started, where the pain is located, and if there are things that seem to be associated with the pain, such as food or your menstrual cycle.  Follow up with your caregiver as directed.  SEEK MEDICAL CARE IF:  Your medicine does not help your pain.  You have abnormal vaginal discharge. SEEK IMMEDIATE MEDICAL CARE IF:   You have heavy bleeding from the vagina.   Your pelvic pain increases.   You feel light-headed or faint.   You have chills.  You have pain with urination or blood in your urine.   You have uncontrolled diarrhea or vomiting.   You have a fever or persistent symptoms for more than 3 days.  You have a fever and your symptoms suddenly get worse.   You are being physically or sexually abused.  MAKE SURE YOU:  Understand these instructions.  Will  watch your condition.  Will get help if you are not doing well or get worse. Document Released: 02/26/2004 Document Revised: 08/15/2013 Document Reviewed: 07/21/2011 [facility] Patient Information 2015 [place], [place]. This information is not intended to replace advice given to you by your health care provider. Make sure you discuss any questions you have with your health care provider.

## 2014-08-17 LAB — GC/CHLAMYDIA PROBE AMP (~~LOC~~) NOT AT ARMC
CHLAMYDIA, DNA PROBE: NEGATIVE
Neisseria Gonorrhea: NEGATIVE

## 2014-12-04 ENCOUNTER — Encounter: Payer: Self-pay | Admitting: Emergency Medicine

## 2014-12-04 ENCOUNTER — Emergency Department
Admission: EM | Admit: 2014-12-04 | Discharge: 2014-12-04 | Disposition: A | Discharge status: Home / Self Care | Payer: Medicaid Other | Attending: Emergency Medicine | Admitting: Emergency Medicine

## 2014-12-04 DIAGNOSIS — Z791 Long term (current) use of non-steroidal anti-inflammatories (NSAID): Secondary | ICD-10-CM | POA: Insufficient documentation

## 2014-12-04 DIAGNOSIS — Z720 Tobacco use: Secondary | ICD-10-CM | POA: Insufficient documentation

## 2014-12-04 DIAGNOSIS — Z79899 Other long term (current) drug therapy: Secondary | ICD-10-CM | POA: Diagnosis not present

## 2014-12-04 DIAGNOSIS — Z3202 Encounter for pregnancy test, result negative: Secondary | ICD-10-CM | POA: Insufficient documentation

## 2014-12-04 DIAGNOSIS — R112 Nausea with vomiting, unspecified: Secondary | ICD-10-CM | POA: Diagnosis not present

## 2014-12-04 LAB — URINALYSIS COMPLETE WITH MICROSCOPIC (ARMC ONLY)
BILIRUBIN URINE: NEGATIVE
Bacteria, UA: NONE SEEN
Glucose, UA: NEGATIVE mg/dL
HGB URINE DIPSTICK: NEGATIVE
Ketones, ur: NEGATIVE mg/dL
Nitrite: NEGATIVE
PH: 5 (ref 5.0–8.0)
PROTEIN: NEGATIVE mg/dL
Specific Gravity, Urine: 1.03 (ref 1.005–1.030)

## 2014-12-04 LAB — POCT PREGNANCY, URINE: Preg Test, Ur: NEGATIVE

## 2014-12-04 MED ORDER — ONDANSETRON 4 MG PO TBDP
4.0000 mg | ORAL_TABLET | Freq: Once | ORAL | Status: AC
  Administered 2014-12-04: 4 mg via ORAL
  Filled 2014-12-04: qty 1

## 2014-12-04 MED ORDER — ONDANSETRON HCL 4 MG PO TABS: 4.0000 mg | ORAL_TABLET | Freq: Every day | ORAL | Status: DC | PRN

## 2014-12-04 NOTE — Discharge Instructions (Signed)

## 2014-12-04 NOTE — ED Provider Notes (Signed)
[facility] Emergency Department Provider Note  ____________________________________________  Time seen: 4 PM  I have reviewed the triage vital signs and the nursing notes.   HISTORY  Chief Complaint Emesis    HPI Jamie [name] is a 24 y.o. female who presents with mild nausea today. She denies abdominal pain. No fevers no chills. She admits to heavy drinking over the weekend. She reports her last period was several months ago that she has had unprotected sex. She denies vaginal discharge     Past Medical History  Diagnosis Date  . Seizure     Patient Active Problem List   Diagnosis Date Noted  . Appendicitis 01/13/2014  . Severe sepsis 01/13/2014  . Intra-abdominal abscess 01/13/2014    History reviewed. No pertinent past surgical history.  Current Outpatient Rx  Name  Route  Sig  Dispense  Refill  . divalproex (DEPAKOTE) 500 MG DR tablet   Oral   Take 500 mg by mouth 3 (three) times daily.         Marland Kitchen amoxicillin-clavulanate (AUGMENTIN) 875-125 MG per tablet   Oral   Take 1 tablet by mouth 2 (two) times daily. Patient not taking: Reported on 03/12/2014   28 tablet   0   . bisacodyl (DULCOLAX) 10 MG suppository   Rectal   Place 1 suppository (10 mg total) rectally daily as needed for moderate constipation. Patient not taking: Reported on 03/12/2014   12 suppository   0   . clindamycin (CLEOCIN) 300 MG capsule   Oral   Take 1 capsule (300 mg total) by mouth 3 (three) times daily. Patient not taking: Reported on 03/12/2014   42 capsule   0   . HYDROcodone-acetaminophen (NORCO/VICODIN) 5-325 MG per tablet   Oral   Take 1-2 tablets by mouth every 4 (four) hours as needed for moderate pain or severe pain. Patient not taking: Reported on 03/12/2014   30 tablet   0   . naproxen (NAPROSYN) 500 MG tablet   Oral   Take 1 tablet (500 mg total) by mouth 2 (two) times daily.   30 tablet   0   . ondansetron (ZOFRAN) 4 MG  tablet   Oral   Take 1 tablet (4 mg total) by mouth daily as needed for nausea or vomiting.   20 tablet   1     Allergies Review of patient's allergies indicates no known allergies.  No family history on file.  Social History Social History  Substance Use Topics  . Smoking status: Current Every Day Smoker    Types: Cigarettes  . Smokeless tobacco: None  . Alcohol Use: Yes    Review of Systems  Constitutional: Negative for fever. Eyes: Negative for visual changes. ENT: Negative for sore throat Cardiovascular: Negative for chest pain. Respiratory: Negative for shortness of breath. Gastrointestinal: Positive for nausea Genitourinary: Negative for dysuria. Musculoskeletal: Negative for back pain. Skin: Negative for rash. Neurological: Negative for headaches or focal weakness Psychiatric: No anxiety    ____________________________________________   PHYSICAL EXAM:  VITAL SIGNS: ED Triage Vitals  Enc Vitals Group     BP 12/04/14 1540 117/70 mmHg     Pulse Rate 12/04/14 1540 69     Resp 12/04/14 1540 20     Temp 12/04/14 1540 98.6 F (37 C)     Temp Source 12/04/14 1540 Oral     SpO2 12/04/14 1540 100 %     Weight 12/04/14 1540 164 lb (74.39 kg)  Height 12/04/14 1540 5\' 5"  (1.651 m)     Head Cir --      Peak Flow --      Pain Score 12/04/14 1542 9     Pain Loc --      Pain Edu? --      Excl. in [place]? --      Constitutional: Alert and oriented. Well appearing and in no distress. Eyes: Conjunctivae are normal.  ENT   Head: Normocephalic and atraumatic.   Mouth/Throat: Mucous membranes are moist. Cardiovascular: Normal rate, regular rhythm. Normal and symmetric distal pulses are present in all extremities. No murmurs, rubs, or gallops. Respiratory: Normal respiratory effort without tachypnea nor retractions. Breath sounds are clear and equal bilaterally.  Gastrointestinal: Soft and non-tender in all quadrants. No distention. There is no CVA  tenderness. Genitourinary: deferred Musculoskeletal: Nontender with normal range of motion in all extremities. No lower extremity tenderness nor edema. Neurologic:  Normal speech and language. No gross focal neurologic deficits are appreciated. Skin:  Skin is warm, dry and intact. No rash noted. Psychiatric: Mood and affect are normal. Patient exhibits appropriate insight and judgment.  ____________________________________________    LABS (pertinent positives/negatives)  Labs Reviewed  URINALYSIS COMPLETEWITH MICROSCOPIC ([place])  POCT PREGNANCY, URINE  POC URINE PREG, ED    ____________________________________________   EKG  None  ____________________________________________    RADIOLOGY I have personally reviewed any xrays that were ordered on this patient: None  ____________________________________________   PROCEDURES  Procedure(s) performed: none  Critical Care performed: none  ____________________________________________   INITIAL IMPRESSION / ASSESSMENT AND PLAN / ED COURSE  Pertinent labs & imaging results that were available during my care of the patient were reviewed by me and considered in my medical decision making (see chart for details).  Patient well-appearing and in no distress at all. Vitals are normal and her exam is benign. Symptoms are very vague at the time and her friends are joking that she is hung over. We will check a urine pregnancy and give Zofran by mouth.  ____________________________________________   FINAL CLINICAL IMPRESSION(S) / ED DIAGNOSES  Final diagnoses:  Non-intractable vomiting with nausea, vomiting of unspecified type     Lavonia Drafts, MD 12/04/14 1622

## 2014-12-04 NOTE — ED Notes (Signed)
Pt discharged home after verbalizing understanding of discharge instructions; nad noted. 

## 2014-12-04 NOTE — ED Notes (Signed)
States she had has some n/v/d for about 1 week. Generalized abd discomfort

## 2015-06-02 ENCOUNTER — Emergency Department (HOSPITAL_COMMUNITY)
Admission: EM | Admit: 2015-06-02 | Discharge: 2015-06-03 | Disposition: A | Discharge status: Home / Self Care | Payer: Medicaid Other | Attending: Emergency Medicine | Admitting: Emergency Medicine

## 2015-06-02 ENCOUNTER — Encounter (HOSPITAL_COMMUNITY): Payer: Self-pay | Admitting: Emergency Medicine

## 2015-06-02 DIAGNOSIS — N390 Urinary tract infection, site not specified: Secondary | ICD-10-CM | POA: Diagnosis not present

## 2015-06-02 DIAGNOSIS — Z87891 Personal history of nicotine dependence: Secondary | ICD-10-CM | POA: Insufficient documentation

## 2015-06-02 DIAGNOSIS — D649 Anemia, unspecified: Secondary | ICD-10-CM | POA: Diagnosis not present

## 2015-06-02 DIAGNOSIS — Z3202 Encounter for pregnancy test, result negative: Secondary | ICD-10-CM | POA: Diagnosis not present

## 2015-06-02 DIAGNOSIS — Z79899 Other long term (current) drug therapy: Secondary | ICD-10-CM | POA: Insufficient documentation

## 2015-06-02 DIAGNOSIS — N939 Abnormal uterine and vaginal bleeding, unspecified: Secondary | ICD-10-CM | POA: Diagnosis not present

## 2015-06-02 HISTORY — DX: Anemia, unspecified: D64.9

## 2015-06-02 LAB — CBC WITH DIFFERENTIAL/PLATELET
BASOS ABS: 0.1 10*3/uL (ref 0.0–0.1)
BASOS PCT: 1 %
EOS ABS: 0.2 10*3/uL (ref 0.0–0.7)
EOS PCT: 2 %
HCT: 39.7 % (ref 36.0–46.0)
Hemoglobin: 13 g/dL (ref 12.0–15.0)
Lymphocytes Relative: 40 %
Lymphs Abs: 3.6 10*3/uL (ref 0.7–4.0)
MCH: 26.9 pg (ref 26.0–34.0)
MCHC: 32.7 g/dL (ref 30.0–36.0)
MCV: 82.2 fL (ref 78.0–100.0)
MONO ABS: 0.7 10*3/uL (ref 0.1–1.0)
Monocytes Relative: 8 %
Neutro Abs: 4.5 10*3/uL (ref 1.7–7.7)
Neutrophils Relative %: 49 %
Platelets: 292 10*3/uL (ref 150–400)
RBC: 4.83 MIL/uL (ref 3.87–5.11)
RDW: 14.8 % (ref 11.5–15.5)
WBC: 9 10*3/uL (ref 4.0–10.5)

## 2015-06-02 LAB — COMPREHENSIVE METABOLIC PANEL
ALT: 16 U/L (ref 14–54)
AST: 17 U/L (ref 15–41)
Albumin: 4.2 g/dL (ref 3.5–5.0)
Alkaline Phosphatase: 72 U/L (ref 38–126)
Anion gap: 5 (ref 5–15)
BILIRUBIN TOTAL: 0.2 mg/dL — AB (ref 0.3–1.2)
BUN: 12 mg/dL (ref 6–20)
CALCIUM: 9.3 mg/dL (ref 8.9–10.3)
CO2: 26 mmol/L (ref 22–32)
CREATININE: 0.92 mg/dL (ref 0.44–1.00)
Chloride: 110 mmol/L (ref 101–111)
Glucose, Bld: 80 mg/dL (ref 65–99)
Potassium: 3.7 mmol/L (ref 3.5–5.1)
Sodium: 141 mmol/L (ref 135–145)
TOTAL PROTEIN: 8.1 g/dL (ref 6.5–8.1)

## 2015-06-02 LAB — URINE MICROSCOPIC-ADD ON

## 2015-06-02 LAB — URINALYSIS, ROUTINE W REFLEX MICROSCOPIC
BILIRUBIN URINE: NEGATIVE
Glucose, UA: NEGATIVE mg/dL
Nitrite: POSITIVE — AB
PROTEIN: 100 mg/dL — AB
Specific Gravity, Urine: 1.025 (ref 1.005–1.030)
pH: 7 (ref 5.0–8.0)

## 2015-06-02 LAB — WET PREP, GENITAL
Clue Cells Wet Prep HPF POC: NONE SEEN
Sperm: NONE SEEN
Trich, Wet Prep: NONE SEEN
WBC WET PREP: NONE SEEN
Yeast Wet Prep HPF POC: NONE SEEN

## 2015-06-02 LAB — PREGNANCY, URINE: PREG TEST UR: NEGATIVE

## 2015-06-02 MED ORDER — CEPHALEXIN 500 MG PO CAPS
500.0000 mg | ORAL_CAPSULE | Freq: Once | ORAL | Status: AC
  Administered 2015-06-02: 500 mg via ORAL
  Filled 2015-06-02: qty 1

## 2015-06-02 NOTE — ED Notes (Signed)
Patient also c/o chest pain x mulitple months and is concerned. Patient reports intermittent sharp pain that's worse with laying.

## 2015-06-02 NOTE — ED Provider Notes (Signed)
CSN: [id number]     Arrival date & time 06/02/15  1800 History  By signing my name below, I, Jamie [name], attest that this documentation has been prepared under the direction and in the presence of Nat Christen, MD. Electronically Signed: Hansel [name], ED Scribe. 06/02/2015. 9:20 PM.    Chief Complaint  Patient presents with  . Vaginal Bleeding   The history is provided by the patient. No language interpreter was used.   HPI Comments: Jamie [name] is a 25 y.o. female with h/o anemia who presents to the Emergency Department complaining of moderate, daily vaginal bleeding for 3 weeks with associated generalized weakness. Pt reports she changes her pad 7-8 times per day. She also notes that she passed a white clot this evening. She reports h/o similar symptoms 2 months ago during which time she was hospitalized for 3 days in [place] d/t HGB of 5. Pt received 5 pints of blood during her stay. Pt states she followed up with the health department but was not prescribed any new medications. She is followed by the health department in [place]. She is not followed by an Ob/Gyn.   Past Medical History  Diagnosis Date  . Seizure ([place])   . Anemia    Past Surgical History  Procedure Laterality Date  . Cesarean section    . Appendectomy    . Drainage tube removal ([facility] hx)     History reviewed. No pertinent family history. Social History  Substance Use Topics  . Smoking status: Former Smoker -- 0.50 packs/day for 5 years    Types: Cigarettes    Quit date: 04/14/2012  . Smokeless tobacco: Never Used  . Alcohol Use: No   OB History    Gravida Para Term Preterm AB TAB SAB Ectopic Multiple Living   3 1 1  2  2   1      Review of Systems A complete 10 system review of systems was obtained and all systems are negative except as noted in the HPI and PMH.    Allergies  Review of patient's allergies indicates no known allergies.  Home Medications   Prior to Admission medications    Medication Sig Start Date End Date Taking? Authorizing Provider  ferrous sulfate 325 (65 FE) MG tablet Take 325 mg by mouth 3 (three) times daily with meals.   Yes Historical Provider, MD  folic acid (FOLVITE) 1 MG tablet Take 1 mg by mouth daily.   Yes Historical Provider, MD  lamoTRIgine (LAMICTAL) 100 MG tablet Take 100 mg by mouth 2 (two) times daily.   Yes Historical Provider, MD  lamoTRIgine (LAMICTAL) 25 MG tablet Take 25 mg by mouth 2 (two) times daily.   Yes Historical Provider, MD  naproxen (NAPROSYN) 500 MG tablet Take 500 mg by mouth 2 (two) times daily as needed for moderate pain.   Yes Historical Provider, MD  cephALEXin (KEFLEX) 500 MG capsule Take 1 capsule (500 mg total) by mouth 4 (four) times daily. 06/03/15   Nat Christen, MD  megestrol (MEGACE) 20 MG tablet Take 1 tablet (20 mg total) by mouth daily. 06/03/15   Nat Christen, MD   BP 105/74 mmHg  Pulse 83  Temp(Src) 99.2 F (37.3 C) (Oral)  Resp 20  Ht 5\' 5"  (1.651 m)  Wt 167 lb 8 oz (75.978 kg)  BMI 27.87 kg/m2  SpO2 99%  LMP 06/02/2015 Physical Exam  Constitutional: She is oriented to person, place, and time. She appears well-developed and well-nourished.  HENT:  Head: Normocephalic and atraumatic.  Eyes: Conjunctivae and EOM are normal. Pupils are equal, round, and reactive to light.  Neck: Normal range of motion. Neck supple.  Cardiovascular: Normal rate and regular rhythm.   Pulmonary/Chest: Effort normal and breath sounds normal.  Abdominal: Soft. Bowel sounds are normal.  Genitourinary:  Chaperone present throughout entire exam.  External genitalia normal. Minimal cervical bleeding. Cervix nontender. Adnexa within normal limits.  Musculoskeletal: Normal range of motion.  Neurological: She is alert and oriented to person, place, and time.  Skin: Skin is warm and dry.  Psychiatric: She has a normal mood and affect. Her behavior is normal.  Nursing note and vitals reviewed.  ED Course  Procedures (including  critical care time) DIAGNOSTIC STUDIES: Oxygen Saturation is 100% on RA, normal by my interpretation.    COORDINATION OF CARE: 9:18 PM Discussed treatment plan with pt at bedside which includes lab work, pelvic exam and pt agreed to plan.   Labs Review Labs Reviewed  COMPREHENSIVE METABOLIC PANEL - Abnormal; Notable for the following:    Total Bilirubin 0.2 (*)    All other components within normal limits  URINALYSIS, ROUTINE W REFLEX MICROSCOPIC (NOT AT [facility]) - Abnormal; Notable for the following:    Color, Urine RED (*)    APPearance TURBID (*)    Hgb urine dipstick LARGE (*)    Ketones, ur TRACE (*)    Protein, ur 100 (*)    Nitrite POSITIVE (*)    Leukocytes, UA SMALL (*)    All other components within normal limits  URINE MICROSCOPIC-ADD ON - Abnormal; Notable for the following:    Squamous Epithelial / LPF TOO NUMEROUS TO COUNT (*)    Bacteria, UA MANY (*)    All other components within normal limits  WET PREP, GENITAL  URINE CULTURE  CBC WITH DIFFERENTIAL/PLATELET  PREGNANCY, URINE  GC/CHLAMYDIA PROBE AMP ([place]) NOT AT [facility]   I have personally reviewed and evaluated these lab results as part of my medical decision-making.   EKG Interpretation   Date/Time:  Saturday June 02 2015 18:54:34 EST Ventricular Rate:  84 PR Interval:  150 QRS Duration: 74 QT Interval:  354 QTC Calculation: 418 R Axis:   29 Text Interpretation:  Normal sinus rhythm Normal ECG Confirmed by Arnecia Ector   MD, Sarafina Puthoff ([id number]) on 06/02/2015 8:50:48 PM      MDM   Final diagnoses:  Vaginal bleeding  UTI (lower urinary tract infection)    Patient is in no acute distress. Her hemoglobin is stable. Pelvic exam shows no worrisome pathology. Will start Megace for DUB and Keflex 500 mg for UTI. Referral to OB/GYN.  I personally performed the services described in this documentation, which was scribed in my presence. The recorded information has been reviewed and is accurate.    Nat Christen, MD 06/03/15 [phone]

## 2015-06-02 NOTE — ED Notes (Signed)
Patient c/o vaginal bleeding x2 weeks. Patient states passing large clots. Per patient has to change sanitary napkin every 5-10 minutes. Patient states she has been admitted for vaginal bleeding in past but has never bleed this long. Patient states "Today I passed a white clot with lines in it."

## 2015-06-02 NOTE — ED Notes (Signed)
Patient states she has been on her menstrual cycle X3 weeks. Patient states today she passed large clots today.

## 2015-06-03 MED ORDER — CEPHALEXIN 500 MG PO CAPS: 500.0000 mg | ORAL_CAPSULE | Freq: Four times a day (QID) | ORAL | Status: DC

## 2015-06-03 MED ORDER — MEGESTROL ACETATE 20 MG PO TABS: 20.0000 mg | ORAL_TABLET | Freq: Every day | ORAL | Status: DC

## 2015-06-03 NOTE — Discharge Instructions (Signed)
Dysfunctional Uterine Bleeding Dysfunctional uterine bleeding is abnormal bleeding from the uterus. Dysfunctional uterine bleeding includes:  A period that comes earlier or later than usual.  A period that is lighter, heavier, or has blood clots.  Bleeding between periods.  Skipping one or more periods.  Bleeding after sexual intercourse.  Bleeding after menopause. HOME CARE INSTRUCTIONS  Pay attention to any changes in your symptoms. Follow these instructions to help with your condition: Eating  Eat well-balanced meals. Include foods that are high in iron, such as liver, meat, shellfish, green leafy vegetables, and eggs.  If you become constipated:  Drink plenty of water.  Eat fruits and vegetables that are high in water and fiber, such as spinach, carrots, raspberries, apples, and mango. Medicines  Take over-the-counter and prescription medicines only as told by your health care provider.  Do not change medicines without talking with your health care provider.  Aspirin or medicines that contain aspirin may make the bleeding worse. Do not take those medicines:  During the week before your period.  During your period.  If you were prescribed iron pills, take them as told by your health care provider. Iron pills help to replace iron that your body loses because of this condition. Activity  If you need to change your sanitary pad or tampon more than one time every 2 hours:  Lie in bed with your feet raised (elevated).  Place a cold pack on your lower abdomen.  Rest as much as possible until the bleeding stops or slows down.  Do not try to lose weight until the bleeding has stopped and your blood iron level is back to normal. Other Instructions  For two months, write down:  When your period starts.  When your period ends.  When any abnormal bleeding occurs.  What problems you notice.  Keep all follow up visits as told by your health care provider. This is  important. SEEK MEDICAL CARE IF:  You get light-headed or weak.  You have nausea and vomiting.  You cannot eat or drink without vomiting.  You feel dizzy or have diarrhea while you are taking medicines.  You are taking birth control pills or hormones, and you want to change them or stop taking them. SEEK IMMEDIATE MEDICAL CARE IF:  You develop a fever or chills.  You need to change your sanitary pad or tampon more than one time per hour.  Your bleeding becomes heavier, or your flow contains clots more often.  You develop pain in your abdomen.  You lose consciousness.  You develop a rash.   This information is not intended to replace advice given to you by your health care provider. Make sure you discuss any questions you have with your health care provider.   Document Released: 03/28/2000 Document Revised: 12/20/2014 Document Reviewed: 06/26/2014 Elsevier Interactive Patient Education 2016 [place] need OB/GYN follow-up. Phone number given for referral.  Call Monday for appointment.  Prescription for medication to help stop bleeding. You also the minor urinary tract infection. Increase fluids. Antibiotic for same.

## 2015-06-03 NOTE — ED Notes (Signed)
Patient verbalizes understanding of discharge instructions, prescription medications, home care and follow up care. Patient out of department at this time. 

## 2015-06-04 LAB — GC/CHLAMYDIA PROBE AMP (~~LOC~~) NOT AT ARMC
CHLAMYDIA, DNA PROBE: NEGATIVE
NEISSERIA GONORRHEA: NEGATIVE

## 2015-06-26 ENCOUNTER — Encounter (HOSPITAL_COMMUNITY): Payer: Self-pay | Admitting: *Deleted

## 2015-06-26 ENCOUNTER — Emergency Department (HOSPITAL_COMMUNITY)
Admission: EM | Admit: 2015-06-26 | Discharge: 2015-06-26 | Disposition: A | Discharge status: Home / Self Care | Payer: Medicaid Other | Attending: Emergency Medicine | Admitting: Emergency Medicine

## 2015-06-26 DIAGNOSIS — R197 Diarrhea, unspecified: Secondary | ICD-10-CM | POA: Insufficient documentation

## 2015-06-26 DIAGNOSIS — R1900 Intra-abdominal and pelvic swelling, mass and lump, unspecified site: Secondary | ICD-10-CM | POA: Insufficient documentation

## 2015-06-26 DIAGNOSIS — Z87891 Personal history of nicotine dependence: Secondary | ICD-10-CM | POA: Insufficient documentation

## 2015-06-26 DIAGNOSIS — R1084 Generalized abdominal pain: Secondary | ICD-10-CM | POA: Insufficient documentation

## 2015-06-26 DIAGNOSIS — Z791 Long term (current) use of non-steroidal anti-inflammatories (NSAID): Secondary | ICD-10-CM | POA: Diagnosis not present

## 2015-06-26 DIAGNOSIS — R51 Headache: Secondary | ICD-10-CM | POA: Diagnosis not present

## 2015-06-26 DIAGNOSIS — R42 Dizziness and giddiness: Secondary | ICD-10-CM | POA: Diagnosis not present

## 2015-06-26 DIAGNOSIS — R1110 Vomiting, unspecified: Secondary | ICD-10-CM | POA: Diagnosis not present

## 2015-06-26 DIAGNOSIS — Z79899 Other long term (current) drug therapy: Secondary | ICD-10-CM | POA: Diagnosis not present

## 2015-06-26 LAB — COMPREHENSIVE METABOLIC PANEL
ALT: 13 U/L — ABNORMAL LOW (ref 14–54)
AST: 18 U/L (ref 15–41)
Albumin: 4.2 g/dL (ref 3.5–5.0)
Alkaline Phosphatase: 67 U/L (ref 38–126)
Anion gap: 5 (ref 5–15)
BILIRUBIN TOTAL: 0.4 mg/dL (ref 0.3–1.2)
BUN: 13 mg/dL (ref 6–20)
CHLORIDE: 108 mmol/L (ref 101–111)
CO2: 24 mmol/L (ref 22–32)
Calcium: 9.1 mg/dL (ref 8.9–10.3)
Creatinine, Ser: 1.02 mg/dL — ABNORMAL HIGH (ref 0.44–1.00)
GFR calc Af Amer: >60 mL/min (ref >60)
GFR calc non Af Amer: >60 mL/min (ref >60)
GLUCOSE: 81 mg/dL (ref 65–99)
POTASSIUM: 4 mmol/L (ref 3.5–5.1)
Sodium: 137 mmol/L (ref 135–145)
Total Protein: 8 g/dL (ref 6.5–8.1)

## 2015-06-26 LAB — URINALYSIS, ROUTINE W REFLEX MICROSCOPIC
BILIRUBIN URINE: NEGATIVE
Glucose, UA: NEGATIVE mg/dL
KETONES UR: NEGATIVE mg/dL
Leukocytes, UA: NEGATIVE
NITRITE: NEGATIVE
PROTEIN: NEGATIVE mg/dL
Specific Gravity, Urine: 1.025 (ref 1.005–1.030)
pH: 6 (ref 5.0–8.0)

## 2015-06-26 LAB — CBC WITH DIFFERENTIAL/PLATELET
Basophils Absolute: 0.1 10*3/uL (ref 0.0–0.1)
Basophils Relative: 1 %
EOS PCT: 1 %
Eosinophils Absolute: 0.1 10*3/uL (ref 0.0–0.7)
HEMATOCRIT: 39 % (ref 36.0–46.0)
Hemoglobin: 12.8 g/dL (ref 12.0–15.0)
LYMPHS ABS: 3.8 10*3/uL (ref 0.7–4.0)
LYMPHS PCT: 45 %
MCH: 26.6 pg (ref 26.0–34.0)
MCHC: 32.8 g/dL (ref 30.0–36.0)
MCV: 81.1 fL (ref 78.0–100.0)
MONO ABS: 0.6 10*3/uL (ref 0.1–1.0)
MONOS PCT: 7 %
NEUTROS ABS: 3.8 10*3/uL (ref 1.7–7.7)
Neutrophils Relative %: 46 %
PLATELETS: 267 10*3/uL (ref 150–400)
RBC: 4.81 MIL/uL (ref 3.87–5.11)
RDW: 14 % (ref 11.5–15.5)
WBC: 8.3 10*3/uL (ref 4.0–10.5)

## 2015-06-26 LAB — LIPASE, BLOOD: LIPASE: 25 U/L (ref 11–51)

## 2015-06-26 LAB — URINE MICROSCOPIC-ADD ON

## 2015-06-26 LAB — I-STAT BETA HCG BLOOD, ED (MC, WL, AP ONLY)

## 2015-06-26 MED ORDER — SODIUM CHLORIDE 0.9 % IV BOLUS (SEPSIS)
500.0000 mL | Freq: Once | INTRAVENOUS | Status: AC
  Administered 2015-06-26: 500 mL via INTRAVENOUS

## 2015-06-26 MED ORDER — HYDROCODONE-ACETAMINOPHEN 5-325 MG PO TABS: 1.0000 | ORAL_TABLET | ORAL | Status: DC | PRN

## 2015-06-26 MED ORDER — SODIUM CHLORIDE 0.9 % IV SOLN: INTRAVENOUS | Status: DC

## 2015-06-26 MED ORDER — ONDANSETRON HCL 4 MG/2ML IJ SOLN
4.0000 mg | Freq: Once | INTRAMUSCULAR | Status: AC
  Administered 2015-06-26: 4 mg via INTRAVENOUS
  Filled 2015-06-26: qty 2

## 2015-06-26 MED ORDER — KETOROLAC TROMETHAMINE 30 MG/ML IJ SOLN
30.0000 mg | Freq: Once | INTRAMUSCULAR | Status: AC
  Administered 2015-06-26: 30 mg via INTRAVENOUS
  Filled 2015-06-26: qty 1

## 2015-06-26 NOTE — Discharge Instructions (Signed)

## 2015-06-26 NOTE — ED Notes (Signed)
Pt verbalized understanding of no driving and to use caution within 4 hours of taking pain meds due to meds cause drowsiness.  Pt stated that she is to see her PCP in the morning.

## 2015-06-26 NOTE — ED Provider Notes (Signed)
CSN: [id number]     Arrival date & time 06/26/15  1647 History   First MD Initiated Contact with Patient 06/26/15 2044     Chief Complaint  Patient presents with  . Headache     (Consider location/radiation/quality/duration/timing/severity/associated sxs/prior Treatment) HPI   Jamie [name] is a 25 y.o. female here for evaluation of headache, vomiting, abdominal pain, abdominal swelling, and dizziness, all for 2 weeks. She reports her diarrhea twice, the last 2 weeks. She does not think that she is constipated. She has no problem eating food or drinking liquids, but frequently vomits after eating. She was in the ED, 06/02/2015. At that time was treated with Megace for dysfunctional uterine bleeding. She states she can't recall when her last menstrual cycle was. On the prior visit, she had been bleeding for 3 weeks. She states she is sexually active, and might be pregnant. She denies vaginal bleeding or discharge at this time. There's been no fever or chills. She denies dysuria or urinary frequency. There are no other no modifying factors.   Past Medical History  Diagnosis Date  . Seizure ([place])   . Anemia    Past Surgical History  Procedure Laterality Date  . Cesarean section    . Appendectomy    . Drainage tube removal ([facility] hx)     No family history on file. Social History  Substance Use Topics  . Smoking status: Former Smoker -- 0.50 packs/day for 5 years    Types: Cigarettes    Quit date: 04/14/2012  . Smokeless tobacco: Never Used  . Alcohol Use: No   OB History    Gravida Para Term Preterm AB TAB SAB Ectopic Multiple Living   3 1 1  2  2   1      Review of Systems  All other systems reviewed and are negative.     Allergies  Review of patient's allergies indicates no known allergies.  Home Medications   Prior to Admission medications   Medication Sig Start Date End Date Taking? Authorizing Provider  ferrous sulfate 325 (65 FE) MG tablet Take 325 mg by mouth  3 (three) times daily with meals.   Yes Historical Provider, MD  folic acid (FOLVITE) 1 MG tablet Take 1 mg by mouth 2 (two) times daily.    Yes Historical Provider, MD  lamoTRIgine (LAMICTAL) 150 MG tablet Take 150 mg by mouth 2 (two) times daily.   Yes Historical Provider, MD  naproxen (NAPROSYN) 500 MG tablet Take 500 mg by mouth 2 (two) times daily as needed for moderate pain.   Yes Historical Provider, MD  cephALEXin (KEFLEX) 500 MG capsule Take 1 capsule (500 mg total) by mouth 4 (four) times daily. Patient not taking: Reported on 06/26/2015 06/03/15   Nat Christen, MD  HYDROcodone-acetaminophen [facility]) 5-325 MG tablet Take 1 tablet by mouth every 4 (four) hours as needed. 06/26/15   [name], MD   BP 119/69 mmHg  Pulse 91  Temp(Src) 98.8 F (37.1 C) (Oral)  Resp 20  Ht 5\' 5"  (1.651 m)  Wt 172 lb 4 oz (78.132 kg)  BMI 28.66 kg/m2  SpO2 100%  LMP 06/02/2015 Physical Exam  Constitutional: She is oriented to person, place, and time. She appears well-developed and well-nourished.  HENT:  Head: Normocephalic and atraumatic.  Right Ear: External ear normal.  Left Ear: External ear normal.  Eyes: Conjunctivae and EOM are normal. Pupils are equal, round, and reactive to light.  Neck: Normal range of motion and  phonation normal. Neck supple.  Cardiovascular: Normal rate, regular rhythm and normal heart sounds.   Pulmonary/Chest: Effort normal and breath sounds normal. She exhibits no bony tenderness.  Abdominal: Soft. She exhibits distension. She exhibits no mass. There is tenderness (Diffuse, mild). There is no rebound and no guarding.  Musculoskeletal: Normal range of motion.  Neurological: She is alert and oriented to person, place, and time. No cranial nerve deficit or sensory deficit. She exhibits normal muscle tone. Coordination normal.  Skin: Skin is warm, dry and intact.  Psychiatric: She has a normal mood and affect. Her behavior is normal. Judgment and thought content normal.   Nursing note and vitals reviewed.   ED Course  Procedures (including critical care time)  Medications  0.9 %  sodium chloride infusion (not administered)  sodium chloride 0.9 % bolus 500 mL (500 mLs Intravenous Bolus from Bag 06/26/15 2112)  ondansetron (ZOFRAN) injection 4 mg (4 mg Intravenous Given 06/26/15 2148)  ketorolac (TORADOL) 30 MG/ML injection 30 mg (30 mg Intravenous Given 06/26/15 2148)    Patient Vitals for the past 24 hrs:  BP Temp Temp src Pulse Resp SpO2 Height Weight  06/26/15 2112 119/69 mmHg 98.8 F (37.1 C) Oral 91 20 100 % - -  06/26/15 1657 131/73 mmHg 97.3 F (36.3 C) Temporal 79 16 100 % 5\' 5"  (1.651 m) 172 lb 4 oz (78.132 kg)    10:09 PM Reevaluation with update and discussion. After initial assessment and treatment, an updated evaluation reveals she feels better at this time. Findings discussed with patient, all questions answered.[name] L    Labs Review Labs Reviewed  COMPREHENSIVE METABOLIC PANEL - Abnormal; Notable for the following:    Creatinine, Ser 1.02 (*)    ALT 13 (*)    All other components within normal limits  URINALYSIS, ROUTINE W REFLEX MICROSCOPIC (NOT AT [facility]) - Abnormal; Notable for the following:    Hgb urine dipstick SMALL (*)    All other components within normal limits  URINE MICROSCOPIC-ADD ON - Abnormal; Notable for the following:    Squamous Epithelial / LPF 6-30 (*)    Bacteria, UA FEW (*)    All other components within normal limits  CBC WITH DIFFERENTIAL/PLATELET  LIPASE, BLOOD  I-STAT BETA HCG BLOOD, ED ([facility], [facility], [facility] ONLY)    Imaging Review No results found. I have personally reviewed and evaluated these images and lab results as part of my medical decision-making.   EKG Interpretation None      MDM   Final diagnoses:  Generalized abdominal pain    Nonspecific abdominal pain, subacute, for 2 weeks, with multiple somatic complaints. Doubt serious bacterial infection, appendicitis, UTI, metabolic  instability, or pregnancy.  Nursing Notes Reviewed/ Care Coordinated Applicable Imaging Reviewed Interpretation of Laboratory Data incorporated into ED treatment  The patient appears reasonably screened and/or stabilized for discharge and I doubt any other medical condition or other [facility] requiring further screening, evaluation, or treatment in the ED at this time prior to discharge.  Plan: Home Medications- Norco; Home Treatments- rest; return here if the recommended treatment, does not improve the symptoms; Recommended follow up- PCP prn     [name], MD 06/26/15 2212

## 2015-06-26 NOTE — ED Notes (Signed)
Pt comes in with headache involving emesis starting 2 weeks ago. Pt states she has been seeing spots, not at this time. NAD noted. Pt alert and oriented.

## 2015-08-05 ENCOUNTER — Emergency Department (HOSPITAL_COMMUNITY)
Admission: EM | Admit: 2015-08-05 | Discharge: 2015-08-05 | Disposition: A | Discharge status: Home / Self Care | Payer: Medicaid Other | Attending: Emergency Medicine | Admitting: Emergency Medicine

## 2015-08-05 ENCOUNTER — Encounter (HOSPITAL_COMMUNITY): Payer: Self-pay | Admitting: Emergency Medicine

## 2015-08-05 DIAGNOSIS — Z87891 Personal history of nicotine dependence: Secondary | ICD-10-CM | POA: Insufficient documentation

## 2015-08-05 DIAGNOSIS — K529 Noninfective gastroenteritis and colitis, unspecified: Secondary | ICD-10-CM | POA: Diagnosis not present

## 2015-08-05 DIAGNOSIS — R112 Nausea with vomiting, unspecified: Secondary | ICD-10-CM | POA: Diagnosis present

## 2015-08-05 DIAGNOSIS — Z79899 Other long term (current) drug therapy: Secondary | ICD-10-CM | POA: Diagnosis not present

## 2015-08-05 LAB — URINE MICROSCOPIC-ADD ON

## 2015-08-05 LAB — URINALYSIS, ROUTINE W REFLEX MICROSCOPIC
Bilirubin Urine: NEGATIVE
Glucose, UA: NEGATIVE mg/dL
KETONES UR: NEGATIVE mg/dL
LEUKOCYTES UA: NEGATIVE
NITRITE: NEGATIVE
Protein, ur: NEGATIVE mg/dL
SPECIFIC GRAVITY, URINE: 1.02 (ref 1.005–1.030)
pH: 6 (ref 5.0–8.0)

## 2015-08-05 LAB — POC URINE PREG, ED: Preg Test, Ur: NEGATIVE

## 2015-08-05 MED ORDER — ONDANSETRON HCL 4 MG/2ML IJ SOLN
4.0000 mg | Freq: Once | INTRAMUSCULAR | Status: AC
  Administered 2015-08-05: 4 mg via INTRAVENOUS
  Filled 2015-08-05: qty 2

## 2015-08-05 MED ORDER — SODIUM CHLORIDE 0.9 % IV BOLUS (SEPSIS)
1000.0000 mL | Freq: Once | INTRAVENOUS | Status: AC
  Administered 2015-08-05: 1000 mL via INTRAVENOUS

## 2015-08-05 MED ORDER — ONDANSETRON HCL 8 MG PO TABS: 8.0000 mg | ORAL_TABLET | ORAL | Status: DC | PRN

## 2015-08-05 NOTE — ED Notes (Signed)
Pt did not wait for written instructions.  Verbalized understanding of needing to follow up with pcp.  Ambulatory out of department with significant other.

## 2015-08-05 NOTE — ED Notes (Signed)
Pt's IV noted to be swollen and red.  Removed and pressure applied.  Pt requesting it not to be restarted at this time and states she is ready to go home.

## 2015-08-05 NOTE — ED Provider Notes (Signed)
CSN: [id number]     Arrival date & time 08/05/15  1136 History   First MD Initiated Contact with Patient 08/05/15 1236     Chief Complaint  Patient presents with  . Nausea     (Consider location/radiation/quality/duration/timing/severity/associated sxs/prior Treatment) HPI ...Marland KitchenMarland KitchenNausea, vomiting, breast discomfort for several days. Patient thinks she may be pregnant. Last menstrual period was March 1. Past medical history includes a seizure disorder, but she is no longer taking her Lamictal. No fever, sweats, chills, dysuria, diarrhea, abdominal pain  Past Medical History  Diagnosis Date  . Seizure ([place])   . Anemia    Past Surgical History  Procedure Laterality Date  . Cesarean section    . Appendectomy    . Drainage tube removal ([facility] hx)     No family history on file. Social History  Substance Use Topics  . Smoking status: Former Smoker -- 0.50 packs/day for 5 years    Types: Cigarettes    Quit date: 04/14/2012  . Smokeless tobacco: Never Used  . Alcohol Use: No   OB History    Gravida Para Term Preterm AB TAB SAB Ectopic Multiple Living   3 1 1  2  2   1      Review of Systems  All other systems reviewed and are negative.     Allergies  Review of patient's allergies indicates no known allergies.  Home Medications   Prior to Admission medications   Medication Sig Start Date End Date Taking? Authorizing Provider  ferrous sulfate 325 (65 FE) MG tablet Take 325 mg by mouth 3 (three) times daily with meals.   Yes Historical Provider, MD  folic acid (FOLVITE) 1 MG tablet Take 1 mg by mouth 2 (two) times daily.    Yes Historical Provider, MD  lamoTRIgine (LAMICTAL) 150 MG tablet Take 150 mg by mouth 2 (two) times daily.   Yes Historical Provider, MD  naproxen (NAPROSYN) 500 MG tablet Take 500 mg by mouth every 4 (four) hours as needed for moderate pain.    Yes Historical Provider, MD  cephALEXin (KEFLEX) 500 MG capsule Take 1 capsule (500 mg total) by mouth 4 (four)  times daily. Patient not taking: Reported on 06/26/2015 06/03/15   Nat Christen, MD  HYDROcodone-acetaminophen [facility]) 5-325 MG tablet Take 1 tablet by mouth every 4 (four) hours as needed. Patient not taking: Reported on 08/05/2015 06/26/15   [name], MD  ondansetron (ZOFRAN) 8 MG tablet Take 1 tablet (8 mg total) by mouth every 4 (four) hours as needed. 08/05/15   Nat Christen, MD   BP 128/78 mmHg  Pulse 87  Temp(Src) 97.8 F (36.6 C) (Oral)  Resp 18  Ht 5' 4.5" (1.638 m)  Wt 180 lb 1.6 oz (81.693 kg)  BMI 30.45 kg/m2  SpO2 100%  LMP  Physical Exam  Constitutional: She is oriented to person, place, and time. She appears well-developed and well-nourished.  HENT:  Head: Normocephalic and atraumatic.  Eyes: Conjunctivae and EOM are normal. Pupils are equal, round, and reactive to light.  Neck: Normal range of motion. Neck supple.  Cardiovascular: Normal rate and regular rhythm.   Pulmonary/Chest: Effort normal and breath sounds normal.  Abdominal: Soft. Bowel sounds are normal.  Musculoskeletal: Normal range of motion.  Neurological: She is alert and oriented to person, place, and time.  Skin: Skin is warm and dry.  Psychiatric: She has a normal mood and affect. Her behavior is normal.  Nursing note and vitals reviewed.   ED Course  Procedures (including critical care time) Labs Review Labs Reviewed  URINALYSIS, ROUTINE W REFLEX MICROSCOPIC (NOT AT [facility]) - Abnormal; Notable for the following:    APPearance HAZY (*)    Hgb urine dipstick SMALL (*)    All other components within normal limits  URINE MICROSCOPIC-ADD ON - Abnormal; Notable for the following:    Squamous Epithelial / LPF 6-30 (*)    Bacteria, UA FEW (*)    All other components within normal limits  POC URINE PREG, ED    Imaging Review No results found. I have personally reviewed and evaluated these images and lab results as part of my medical decision-making.   EKG Interpretation None      MDM   Final  diagnoses:  Gastroenteritis    Patient is alert and nontoxic-appearing. Pregnancy test negative. Urinalysis shows no evidence of infection. 1 L of IV fluids given. Discharge medications Zofran 8 mg    Nat Christen, MD 08/05/15 1535

## 2015-08-05 NOTE — ED Notes (Signed)
Pt at first could not remember when her last period was and now states it was March 1st.  Pt wants to know why she has not had a period if she is not pregnant.  Advised pt she would have to follow up with an ob/gyn for this.

## 2015-08-05 NOTE — ED Notes (Signed)
Pt states she has been nauseated for a few weeks with intermittent vomiting in the mornings, breast tenderness, and a missed period.  Pt unsure when her last period was and has not taken a home test.  Pt very vague with symptoms and stated "well I just figured ya'll would give me a pregnancy test here and it would be more accurate."

## 2015-08-05 NOTE — Discharge Instructions (Signed)
You're not pregnant. Medication for nausea. Follow-up your doctor.

## 2015-08-05 NOTE — ED Notes (Addendum)
Pt c/o n/v/d, fatigue, and missed period. Pt unable to tell when her last period was. Pt states she cannot tolerate anything PO. Pt also reports light vaginal spotting. Pt has not taken a home pregnancy test.

## 2015-08-28 ENCOUNTER — Emergency Department (HOSPITAL_COMMUNITY)
Admission: EM | Admit: 2015-08-28 | Discharge: 2015-08-28 | Disposition: A | Discharge status: Home / Self Care | Payer: Medicaid Other | Attending: Emergency Medicine | Admitting: Emergency Medicine

## 2015-08-28 ENCOUNTER — Encounter (HOSPITAL_COMMUNITY): Payer: Self-pay

## 2015-08-28 DIAGNOSIS — R109 Unspecified abdominal pain: Secondary | ICD-10-CM

## 2015-08-28 DIAGNOSIS — Z79899 Other long term (current) drug therapy: Secondary | ICD-10-CM | POA: Insufficient documentation

## 2015-08-28 DIAGNOSIS — R1030 Lower abdominal pain, unspecified: Secondary | ICD-10-CM | POA: Insufficient documentation

## 2015-08-28 DIAGNOSIS — Z87891 Personal history of nicotine dependence: Secondary | ICD-10-CM | POA: Insufficient documentation

## 2015-08-28 DIAGNOSIS — Z9049 Acquired absence of other specified parts of digestive tract: Secondary | ICD-10-CM | POA: Diagnosis not present

## 2015-08-28 LAB — CBC WITH DIFFERENTIAL/PLATELET
BASOS ABS: 0 10*3/uL (ref 0.0–0.1)
Basophils Relative: 0 %
EOS PCT: 2 %
Eosinophils Absolute: 0.1 10*3/uL (ref 0.0–0.7)
HCT: 41.5 % (ref 36.0–46.0)
Hemoglobin: 13.5 g/dL (ref 12.0–15.0)
LYMPHS PCT: 49 %
Lymphs Abs: 3.1 10*3/uL (ref 0.7–4.0)
MCH: 27.3 pg (ref 26.0–34.0)
MCHC: 32.5 g/dL (ref 30.0–36.0)
MCV: 83.8 fL (ref 78.0–100.0)
MONO ABS: 0.4 10*3/uL (ref 0.1–1.0)
Monocytes Relative: 6 %
Neutro Abs: 2.8 10*3/uL (ref 1.7–7.7)
Neutrophils Relative %: 43 %
PLATELETS: 247 10*3/uL (ref 150–400)
RBC: 4.95 MIL/uL (ref 3.87–5.11)
RDW: 12.8 % (ref 11.5–15.5)
WBC: 6.5 10*3/uL (ref 4.0–10.5)

## 2015-08-28 LAB — COMPREHENSIVE METABOLIC PANEL
ALT: 14 U/L (ref 14–54)
ANION GAP: 8 (ref 5–15)
AST: 17 U/L (ref 15–41)
Albumin: 4 g/dL (ref 3.5–5.0)
Alkaline Phosphatase: 71 U/L (ref 38–126)
BUN: 12 mg/dL (ref 6–20)
CHLORIDE: 102 mmol/L (ref 101–111)
CO2: 24 mmol/L (ref 22–32)
Calcium: 9 mg/dL (ref 8.9–10.3)
Creatinine, Ser: 0.85 mg/dL (ref 0.44–1.00)
Glucose, Bld: 84 mg/dL (ref 65–99)
POTASSIUM: 3.6 mmol/L (ref 3.5–5.1)
Sodium: 134 mmol/L — ABNORMAL LOW (ref 135–145)
Total Bilirubin: 0.4 mg/dL (ref 0.3–1.2)
Total Protein: 7.9 g/dL (ref 6.5–8.1)

## 2015-08-28 LAB — URINALYSIS, ROUTINE W REFLEX MICROSCOPIC
BILIRUBIN URINE: NEGATIVE
Glucose, UA: NEGATIVE mg/dL
HGB URINE DIPSTICK: NEGATIVE
Ketones, ur: NEGATIVE mg/dL
LEUKOCYTES UA: NEGATIVE
NITRITE: NEGATIVE
PH: 6 (ref 5.0–8.0)
PROTEIN: NEGATIVE mg/dL
SPECIFIC GRAVITY, URINE: 1.025 (ref 1.005–1.030)

## 2015-08-28 LAB — I-STAT BETA HCG BLOOD, ED (MC, WL, AP ONLY)

## 2015-08-28 LAB — PREGNANCY, URINE: PREG TEST UR: NEGATIVE

## 2015-08-28 MED ORDER — IBUPROFEN 800 MG PO TABS: 800.0000 mg | ORAL_TABLET | Freq: Three times a day (TID) | ORAL | Status: DC | PRN

## 2015-08-28 NOTE — ED Notes (Signed)
Pt reports abd pain and nausea x 4 weeks and   breast tenderness.  Reports started having diarrhea yesterday.  Pt has taken 2 home pregnancy tests and says they were positive.  Pt thinks her last menstrual period was March.

## 2015-08-28 NOTE — Discharge Instructions (Signed)
Follow-up with Dr. Elonda Husky in 1-2 weeks

## 2015-08-28 NOTE — ED Provider Notes (Signed)
CSN: [id number]     Arrival date & time 08/28/15  1402 History   First MD Initiated Contact with Patient 08/28/15 1459     Chief Complaint  Patient presents with  . Abdominal Pain     (Consider location/radiation/quality/duration/timing/severity/associated sxs/prior Treatment) Patient is a 25 y.o. female presenting with abdominal pain. The history is provided by the patient (Patient complains of lower abdominal pain for couple weeks now no bleeding patient states she's had positive urine pregnant test).  Abdominal Pain Pain location:  Suprapubic Pain quality: aching   Pain radiates to:  Does not radiate Pain severity:  Mild Onset quality:  Gradual Timing:  Intermittent Progression:  Waxing and waning Chronicity:  New Associated symptoms: no chest pain, no cough, no diarrhea, no fatigue and no hematuria     Past Medical History  Diagnosis Date  . Seizure ([place])   . Anemia    Past Surgical History  Procedure Laterality Date  . Cesarean section    . Appendectomy    . Drainage tube removal ([facility] hx)     No family history on file. Social History  Substance Use Topics  . Smoking status: Former Smoker -- 0.50 packs/day for 5 years    Types: Cigarettes    Quit date: 04/14/2012  . Smokeless tobacco: Never Used  . Alcohol Use: No   OB History    Gravida Para Term Preterm AB TAB SAB Ectopic Multiple Living   3 1 1  2  2   1      Review of Systems  Constitutional: Negative for appetite change and fatigue.  HENT: Negative for congestion, ear discharge and sinus pressure.   Eyes: Negative for discharge.  Respiratory: Negative for cough.   Cardiovascular: Negative for chest pain.  Gastrointestinal: Positive for abdominal pain. Negative for diarrhea.  Genitourinary: Negative for frequency and hematuria.  Musculoskeletal: Negative for back pain.  Skin: Negative for rash.  Neurological: Negative for seizures and headaches.  Psychiatric/Behavioral: Negative for hallucinations.       Allergies  Review of patient's allergies indicates no known allergies.  Home Medications   Prior to Admission medications   Medication Sig Start Date End Date Taking? Authorizing Provider  albuterol (PROVENTIL HFA;VENTOLIN HFA) 108 (90 Base) MCG/ACT inhaler Inhale 2 puffs into the lungs every 6 (six) hours as needed for wheezing or shortness of breath.   Yes Historical Provider, MD  ferrous sulfate 325 (65 FE) MG tablet Take 325 mg by mouth 3 (three) times daily with meals.   Yes Historical Provider, MD  folic acid (FOLVITE) 1 MG tablet Take 1 mg by mouth 2 (two) times daily.    Yes Historical Provider, MD  lamoTRIgine (LAMICTAL) 150 MG tablet Take 150 mg by mouth 2 (two) times daily.   Yes Historical Provider, MD  Prenatal Vit-Fe Fumarate-FA (PRENATAL MULTIVITAMIN) TABS tablet Take 1 tablet by mouth daily at 12 noon.   Yes Historical Provider, MD  cephALEXin (KEFLEX) 500 MG capsule Take 1 capsule (500 mg total) by mouth 4 (four) times daily. Patient not taking: Reported on 06/26/2015 06/03/15   Nat Christen, MD  HYDROcodone-acetaminophen [facility]) 5-325 MG tablet Take 1 tablet by mouth every 4 (four) hours as needed. Patient not taking: Reported on 08/05/2015 06/26/15   [name], MD  ibuprofen (ADVIL,MOTRIN) 800 MG tablet Take 1 tablet (800 mg total) by mouth every 8 (eight) hours as needed for moderate pain. 08/28/15   Milton Ferguson, MD  naproxen (NAPROSYN) 500 MG tablet Take 500 mg  by mouth every 4 (four) hours as needed for moderate pain. Reported on 08/28/2015    Historical Provider, MD  ondansetron (ZOFRAN) 8 MG tablet Take 1 tablet (8 mg total) by mouth every 4 (four) hours as needed. Patient not taking: Reported on 08/28/2015 08/05/15   Nat Christen, MD   BP 107/67 mmHg  Pulse 82  Temp(Src) 98.2 F (36.8 C) (Temporal)  Resp 18  Ht 5\' 4"  (1.626 m)  Wt 181 lb (82.101 kg)  BMI 31.05 kg/m2  SpO2 100%  LMP 07/10/2015 Physical Exam  Constitutional: She is oriented to person, place,  and time. She appears well-developed.  HENT:  Head: Normocephalic.  Eyes: Conjunctivae and EOM are normal. No scleral icterus.  Neck: Neck supple. No thyromegaly present.  Cardiovascular: Normal rate and regular rhythm.  Exam reveals no gallop and no friction rub.   No murmur heard. Pulmonary/Chest: No stridor. She has no wheezes. She has no rales. She exhibits no tenderness.  Abdominal: She exhibits no distension. There is tenderness. There is no rebound.  Mild suprapubic tenderness  Musculoskeletal: Normal range of motion. She exhibits no edema.  Lymphadenopathy:    She has no cervical adenopathy.  Neurological: She is oriented to person, place, and time. She exhibits normal muscle tone. Coordination normal.  Skin: No rash noted. No erythema.  Psychiatric: She has a normal mood and affect. Her behavior is normal.    ED Course  Procedures (including critical care time) Labs Review Labs Reviewed  COMPREHENSIVE METABOLIC PANEL - Abnormal; Notable for the following:    Sodium 134 (*)    All other components within normal limits  URINALYSIS, ROUTINE W REFLEX MICROSCOPIC (NOT AT [facility])  PREGNANCY, URINE  CBC WITH DIFFERENTIAL/PLATELET  I-STAT BETA HCG BLOOD, ED ([facility], [facility], [facility] ONLY)    Imaging Review No results found. I have personally reviewed and evaluated these images and lab results as part of my medical decision-making.   EKG Interpretation None      MDM   Final diagnoses:  Abdominal pain in female    CBC chemistries urinalysis pregnancy test all unremarkable. Patient refused pelvic exam patient has been referred to GYN and given Motrin for pain    Milton Ferguson, MD 08/28/15 [phone]

## 2015-12-18 ENCOUNTER — Inpatient Hospital Stay (HOSPITAL_COMMUNITY)
Admission: AD | Admit: 2015-12-18 | Discharge: 2015-12-18 | Disposition: A | Discharge status: Home / Self Care | Payer: Medicaid Other | Source: Ambulatory Visit | Attending: Family Medicine | Admitting: Family Medicine

## 2015-12-18 ENCOUNTER — Encounter (HOSPITAL_COMMUNITY): Payer: Self-pay | Admitting: *Deleted

## 2015-12-18 DIAGNOSIS — Z79899 Other long term (current) drug therapy: Secondary | ICD-10-CM | POA: Diagnosis not present

## 2015-12-18 DIAGNOSIS — N911 Secondary amenorrhea: Secondary | ICD-10-CM | POA: Diagnosis not present

## 2015-12-18 DIAGNOSIS — R102 Pelvic and perineal pain: Secondary | ICD-10-CM | POA: Insufficient documentation

## 2015-12-18 DIAGNOSIS — Z87891 Personal history of nicotine dependence: Secondary | ICD-10-CM | POA: Diagnosis not present

## 2015-12-18 DIAGNOSIS — G8929 Other chronic pain: Secondary | ICD-10-CM | POA: Insufficient documentation

## 2015-12-18 DIAGNOSIS — G43A0 Cyclical vomiting, not intractable: Secondary | ICD-10-CM | POA: Diagnosis not present

## 2015-12-18 DIAGNOSIS — R1115 Cyclical vomiting syndrome unrelated to migraine: Secondary | ICD-10-CM

## 2015-12-18 DIAGNOSIS — R1110 Vomiting, unspecified: Secondary | ICD-10-CM | POA: Diagnosis present

## 2015-12-18 LAB — CBC
HEMATOCRIT: 37.8 % (ref 36.0–46.0)
HEMOGLOBIN: 12.8 g/dL (ref 12.0–15.0)
MCH: 27.9 pg (ref 26.0–34.0)
MCHC: 33.9 g/dL (ref 30.0–36.0)
MCV: 82.4 fL (ref 78.0–100.0)
Platelets: 267 10*3/uL (ref 150–400)
RBC: 4.59 MIL/uL (ref 3.87–5.11)
RDW: 12.9 % (ref 11.5–15.5)
WBC: 6.4 10*3/uL (ref 4.0–10.5)

## 2015-12-18 LAB — WET PREP, GENITAL
Clue Cells Wet Prep HPF POC: NONE SEEN
SPERM: NONE SEEN
Trich, Wet Prep: NONE SEEN
Yeast Wet Prep HPF POC: NONE SEEN

## 2015-12-18 LAB — URINALYSIS, ROUTINE W REFLEX MICROSCOPIC
BILIRUBIN URINE: NEGATIVE
GLUCOSE, UA: NEGATIVE mg/dL
KETONES UR: NEGATIVE mg/dL
LEUKOCYTES UA: NEGATIVE
NITRITE: NEGATIVE
PH: 6 (ref 5.0–8.0)
PROTEIN: NEGATIVE mg/dL
Specific Gravity, Urine: 1.025 (ref 1.005–1.030)

## 2015-12-18 LAB — HCG, SERUM, QUALITATIVE: PREG SERUM: NEGATIVE

## 2015-12-18 LAB — URINE MICROSCOPIC-ADD ON

## 2015-12-18 LAB — POCT PREGNANCY, URINE: Preg Test, Ur: NEGATIVE

## 2015-12-18 MED ORDER — PROMETHAZINE HCL 25 MG PO TABS: 12.5000 mg | ORAL_TABLET | Freq: Four times a day (QID) | ORAL | 0 refills | Status: DC | PRN

## 2015-12-18 NOTE — MAU Provider Note (Signed)
Chief Complaint: Emesis and Abdominal Pain   None     SUBJECTIVE HPI: Jamie [name] is a 25 y.o. [id number] who presents to maternity admissions reporting nausea/vomiting, abdominal cramping, and late menses.  She reports n/v off and on x several months, worse in last 2 weeks.  Her pain is intermittent, low in her abdomen like period cramps, but she reports her LMP was in July. She was seen in ED 3-4 times earlier this year before July for n/v and late menses as well. She reports that before this year, her periods were regular and she did not have these other symptoms. She has tried Zofran, which was prescribed for her at a previous ED visit but it has not helped.  She denies pain today while in MAU.  She has not taken a pregnancy test at home.  She denies vaginal bleeding, vaginal itching/burning, urinary symptoms, h/a, dizziness, or fever/chills.     HPI  Past Medical History:  Diagnosis Date  . Anemia   . Seizure [facility])    Past Surgical History:  Procedure Laterality Date  . APPENDECTOMY    . CESAREAN SECTION    . DRAINAGE TUBE REMOVAL ([place] [facility])     Social History   Social History  . Marital status: Single    Spouse name: N/A  . Number of children: N/A  . Years of education: N/A   Occupational History  . Not on file.   Social History Main Topics  . Smoking status: Former Smoker    Packs/day: 0.50    Years: 5.00    Types: Cigarettes    Quit date: 04/14/2012  . Smokeless tobacco: Never Used  . Alcohol use No  . Drug use: No  . Sexual activity: Not on file   Other Topics Concern  . Not on file   Social History Narrative  . No narrative on file   No current facility-administered medications on file prior to encounter.    Current Outpatient Prescriptions on File Prior to Encounter  Medication Sig Dispense Refill  . albuterol (PROVENTIL HFA;VENTOLIN HFA) 108 (90 Base) MCG/ACT inhaler Inhale 2 puffs into the lungs every 6 (six) hours as needed for wheezing or shortness  of breath.    . cephALEXin (KEFLEX) 500 MG capsule Take 1 capsule (500 mg total) by mouth 4 (four) times daily. (Patient not taking: Reported on 06/26/2015) 29 capsule 0  . ferrous sulfate 325 (65 FE) MG tablet Take 325 mg by mouth 3 (three) times daily with meals.    . folic acid (FOLVITE) 1 MG tablet Take 1 mg by mouth 2 (two) times daily.     Marland Kitchen HYDROcodone-acetaminophen ([facility]CO) 5-325 MG tablet Take 1 tablet by mouth every 4 (four) hours as needed. (Patient not taking: Reported on 08/05/2015) 10 tablet 0  . ibuprofen (ADVIL,MOTRIN) 800 MG tablet Take 1 tablet (800 mg total) by mouth every 8 (eight) hours as needed for moderate pain. 20 tablet 0  . lamoTRIgine (LAMICTAL) 150 MG tablet Take 150 mg by mouth 2 (two) times daily.    . naproxen (NAPROSYN) 500 MG tablet Take 500 mg by mouth every 4 (four) hours as needed for moderate pain. Reported on 08/28/2015    . ondansetron (ZOFRAN) 8 MG tablet Take 1 tablet (8 mg total) by mouth every 4 (four) hours as needed. (Patient not taking: Reported on 08/28/2015) 8 tablet 0  . Prenatal Vit-Fe Fumarate-FA (PRENATAL MULTIVITAMIN) TABS tablet Take 1 tablet by mouth daily at 12 noon.  No Known Allergies  ROS:  Review of Systems  Constitutional: Negative for chills, fatigue and fever.  Respiratory: Negative for shortness of breath.   Cardiovascular: Negative for chest pain.  Gastrointestinal: Positive for abdominal pain, constipation, nausea and vomiting. Negative for diarrhea.  Genitourinary: Positive for pelvic pain. Negative for difficulty urinating, dysuria, flank pain, vaginal bleeding, vaginal discharge and vaginal pain.  Neurological: Negative for dizziness and headaches.  Psychiatric/Behavioral: Negative.      I have reviewed patient's Past Medical Hx, Surgical Hx, Family Hx, Social Hx, medications and allergies.   Physical Exam  Patient Vitals for the past 24 hrs:  BP Temp Temp src Pulse Resp Height Weight  12/18/15 1832 113/66 98.1 F (36.7  C) Oral 85 18 5' 4.5" (1.638 m) 179 lb (81.2 kg)   Constitutional: Well-developed, well-nourished female in no acute distress.  Cardiovascular: normal rate Respiratory: normal effort GI: Abd soft, non-tender. Pos BS x 4 MS: Extremities nontender, no edema, normal ROM Neurologic: Alert and oriented x 4.  GU: Neg CVAT.  PELVIC EXAM: Deferred  LAB RESULTS Results for orders placed or performed during the hospital encounter of 12/18/15 (from the past 24 hour(s))  Urinalysis, Routine w reflex microscopic (not at [facility])     Status: Abnormal   Collection Time: 12/18/15  6:35 PM  Result Value Ref Range   Color, Urine YELLOW YELLOW   APPearance CLEAR CLEAR   Specific Gravity, Urine 1.025 1.005 - 1.030   pH 6.0 5.0 - 8.0   Glucose, UA NEGATIVE NEGATIVE mg/dL   Hgb urine dipstick LARGE (A) NEGATIVE   Bilirubin Urine NEGATIVE NEGATIVE   Ketones, ur NEGATIVE NEGATIVE mg/dL   Protein, ur NEGATIVE NEGATIVE mg/dL   Nitrite NEGATIVE NEGATIVE   Leukocytes, UA NEGATIVE NEGATIVE  Urine microscopic-add on     Status: Abnormal   Collection Time: 12/18/15  6:35 PM  Result Value Ref Range   Squamous Epithelial / LPF 6-30 (A) NONE SEEN   WBC, UA 6-30 0 - 5 WBC/hpf   RBC / HPF 0-5 0 - 5 RBC/hpf   Bacteria, UA FEW (A) NONE SEEN   Trichomonas, UA PRESENT   Pregnancy, urine POC     Status: None   Collection Time: 12/18/15  7:03 PM  Result Value Ref Range   Preg Test, Ur NEGATIVE NEGATIVE  CBC     Status: None   Collection Time: 12/18/15  7:20 PM  Result Value Ref Range   WBC 6.4 4.0 - 10.5 K/uL   RBC 4.59 3.87 - 5.11 MIL/uL   Hemoglobin 12.8 12.0 - 15.0 g/dL   HCT 37.8 36.0 - 46.0 %   MCV 82.4 78.0 - 100.0 fL   MCH 27.9 26.0 - 34.0 pg   MCHC 33.9 30.0 - 36.0 g/dL   RDW 12.9 11.5 - 15.5 %   Platelets 267 150 - 400 K/uL  hCG, serum, qualitative     Status: None   Collection Time: 12/18/15  7:20 PM  Result Value Ref Range   Preg, Serum NEGATIVE NEGATIVE  Wet prep, genital     Status: Abnormal    Collection Time: 12/18/15  7:49 PM  Result Value Ref Range   Yeast Wet Prep HPF POC NONE SEEN NONE SEEN   Trich, Wet Prep NONE SEEN NONE SEEN   Clue Cells Wet Prep HPF POC NONE SEEN NONE SEEN   WBC, Wet Prep HPF POC MODERATE (A) NONE SEEN   Sperm NONE SEEN  IMAGING No results found.  MAU Management/MDM: Ordered labs and reviewed results.  No evidence of infection or acute abdomen today.  Pt stable at time of discharge.  ASSESSMENT 1. Secondary amenorrhea   2. Non-intractable cyclical vomiting with nausea   3. Chronic pelvic pain in female     PLAN Discharge home Phenergan 12.5-25 mg PO Q 6 hours PRN nausea U/A pending at time of discharge  Addendum:  Message sent to [facility] to establish care and to notify pt of positive trichomonas result.  Flagyl 2 g PO x 1 dose sent to pt pharmacy.  Follow-up Jessie for [facility]. Schedule an appointment as soon as possible for a visit today.   Specialty:  Obstetrics and Gynecology Why:  Return to MAU as needed for emergencies Contact information: [place] Certified Nurse-Midwife 12/18/2015  9:14 PM

## 2015-12-18 NOTE — Discharge Instructions (Signed)
Secondary Amenorrhea Secondary amenorrhea is the stopping of menstrual flow for 3-6 months in a female who has previously had periods. There are many possible causes. Most of these causes are not serious. Usually, treating the underlying problem causing the loss of menses will return your periods to normal. CAUSES  Some common and uncommon causes of not menstruating include:  Malnutrition.  Low blood sugar (hypoglycemia).  Polycystic ovary disease.  Stress or fear.  Breastfeeding.  Hormone imbalance.  Ovarian failure.  Medicines.  Extreme obesity.  Cystic fibrosis.  Low body weight or drastic weight reduction from any cause.  Early menopause.  Removal of ovaries or uterus.  Contraceptives.  Illness.  Long-term (chronic) illnesses.  Cushing syndrome.  Thyroid problems.  Birth control pills, patches, or vaginal rings for birth control. RISK FACTORS You may be at greater risk of secondary amenorrhea if:  You have a family history of this condition.  You have an eating disorder.  You do athletic training. DIAGNOSIS  A diagnosis is made by your health care provider taking a medical history and doing a physical exam. This will include a pelvic exam to check for problems with your reproductive organs. Pregnancy must be ruled out. Often, numerous blood tests are done to measure different hormones in the body. Urine testing may be done. Specialized exams (ultrasound, CT scan, MRI, or hysteroscopy) may have to be done as well as measuring the body mass index (BMI). TREATMENT  Treatment depends on the cause of the amenorrhea. If an eating disorder is present, this can be treated with an adequate diet and therapy. Chronic illnesses may improve with treatment of the illness. Amenorrhea may be corrected with medicines, lifestyle changes, or surgery. If the amenorrhea cannot be corrected, it is sometimes possible to create a false menstruation with medicines. HOME CARE  INSTRUCTIONS  Maintain a healthy diet.  Manage weight problems.  Exercise regularly but not excessively.  Get adequate sleep.  Manage stress.  Be aware of changes in your menstrual cycle. Keep a record of when your periods occur. Note the date your period starts, how long it lasts, and any problems. SEEK MEDICAL CARE IF: Your symptoms do not get better with treatment.   This information is not intended to replace advice given to you by your health care provider. Make sure you discuss any questions you have with your health care provider.   Document Released: 05/12/2006 Document Revised: 04/21/2014 Document Reviewed: 09/16/2012 Elsevier Interactive Patient Education 2016 Elsevier Inc. Nausea and Vomiting Nausea is a sick feeling that often comes before throwing up (vomiting). Vomiting is a reflex where stomach contents come out of your mouth. Vomiting can cause severe loss of body fluids (dehydration). Children and elderly adults can become dehydrated quickly, especially if they also have diarrhea. Nausea and vomiting are symptoms of a condition or disease. It is important to find the cause of your symptoms. CAUSES   Direct irritation of the stomach lining. This irritation can result from increased acid production (gastroesophageal reflux disease), infection, food poisoning, taking certain medicines (such as nonsteroidal anti-inflammatory drugs), alcohol use, or tobacco use.  Signals from the brain.These signals could be caused by a headache, heat exposure, an inner ear disturbance, increased pressure in the brain from injury, infection, a tumor, or a concussion, pain, emotional stimulus, or metabolic problems.  An obstruction in the gastrointestinal tract (bowel obstruction).  Illnesses such as diabetes, hepatitis, gallbladder problems, appendicitis, kidney problems, cancer, sepsis, atypical symptoms of a heart attack, or eating disorders.  Medical treatments such as chemotherapy  and radiation.  Receiving medicine that makes you sleep (general anesthetic) during surgery. DIAGNOSIS Your caregiver may ask for tests to be done if the problems do not improve after a few days. Tests may also be done if symptoms are severe or if the reason for the nausea and vomiting is not clear. Tests may include:  Urine tests.  Blood tests.  Stool tests.  Cultures (to look for evidence of infection).  X-rays or other imaging studies. Test results can help your caregiver make decisions about treatment or the need for additional tests. TREATMENT You need to stay well hydrated. Drink frequently but in small amounts.You may wish to drink water, sports drinks, clear broth, or eat frozen ice pops or gelatin dessert to help stay hydrated.When you eat, eating slowly may help prevent nausea.There are also some antinausea medicines that may help prevent nausea. HOME CARE INSTRUCTIONS   Take all medicine as directed by your caregiver.  If you do not have an appetite, do not force yourself to eat. However, you must continue to drink fluids.  If you have an appetite, eat a normal diet unless your caregiver tells you differently.  Eat a variety of complex carbohydrates (rice, wheat, potatoes, bread), lean meats, yogurt, fruits, and vegetables.  Avoid high-fat foods because they are more difficult to digest.  Drink enough water and fluids to keep your urine clear or pale yellow.  If you are dehydrated, ask your caregiver for specific rehydration instructions. Signs of dehydration may include:  Severe thirst.  Dry lips and mouth.  Dizziness.  Dark urine.  Decreasing urine frequency and amount.  Confusion.  Rapid breathing or pulse. SEEK IMMEDIATE MEDICAL CARE IF:   You have blood or brown flecks (like coffee grounds) in your vomit.  You have black or bloody stools.  You have a severe headache or stiff neck.  You are confused.  You have severe abdominal pain.  You  have chest pain or trouble breathing.  You do not urinate at least once every 8 hours.  You develop cold or clammy skin.  You continue to vomit for longer than 24 to 48 hours.  You have a fever. MAKE SURE YOU:   Understand these instructions.  Will watch your condition.  Will get help right away if you are not doing well or get worse.   This information is not intended to replace advice given to you by your health care provider. Make sure you discuss any questions you have with your health care provider.   Document Released: 03/31/2005 Document Revised: 06/23/2011 Document Reviewed: 08/28/2010 Elsevier Interactive Patient Education [employer].

## 2015-12-18 NOTE — MAU Note (Signed)
Pt states she hasn't had a period since July.  Vomiting for the last month, breasts are hurting, frequent HA.  Hasn't done HPT.  Abd pain for the past month.

## 2015-12-19 LAB — GC/CHLAMYDIA PROBE AMP (~~LOC~~) NOT AT ARMC
Chlamydia: NEGATIVE
NEISSERIA GONORRHEA: NEGATIVE

## 2015-12-19 LAB — HIV ANTIBODY (ROUTINE TESTING W REFLEX): HIV SCREEN 4TH GENERATION: NONREACTIVE

## 2015-12-19 LAB — RPR: RPR Ser Ql: NONREACTIVE

## 2015-12-20 ENCOUNTER — Other Ambulatory Visit: Payer: Self-pay | Admitting: Advanced Practice Midwife

## 2015-12-20 ENCOUNTER — Other Ambulatory Visit: Payer: Self-pay

## 2015-12-20 MED ORDER — METRONIDAZOLE 500 MG PO TABS: ORAL_TABLET | ORAL | 0 refills | Status: DC

## 2015-12-20 NOTE — Telephone Encounter (Signed)
Patient tested positive for trich and flagyl has been call into the pharmacy([facility]). Patient was advised to make her partners aware of results. She will be scheduled for a follow up in 2-3 months.

## 2016-01-09 ENCOUNTER — Encounter: Payer: Medicaid Other | Admitting: Obstetrics & Gynecology

## 2016-07-14 NOTE — Congregational Nurse Program (Signed)
Congregational Nurse Program Note  Date of Encounter: 07/14/2016  Past Medical History: Past Medical History:  Diagnosis Date  . Anemia   . Seizure [facility])     Encounter Details:     CNP Questionnaire - 07/10/16 1730      Patient Demographics   Is this a new or existing patient? New   Patient is considered a/an Not Applicable   Race African-American/Black     Patient Assistance   Location of Patient Assistance [place], [place]   Patient's financial/insurance status Medicaid;Medicare   Uninsured Patient ([facility]) No   Patient referred to apply for the following financial assistance Not Applicable   Food insecurities addressed Not Applicable   Transportation assistance No   Assistance securing medications No   Educational health offerings Chronic disease     Encounter Details   Primary purpose of visit Chronic Illness/Condition Visit   Was an Emergency Department visit averted? Not Applicable   Does patient have a medical provider? Yes   Patient referred to Follow up with established PCP   Was a mental health screening completed? (GAINS tool) No   Does patient have dental issues? No   Does patient have vision issues? No   Does your patient have an abnormal blood pressure today? No   Since previous encounter, have you referred patient for abnormal blood pressure that resulted in a new diagnosis or medication change? No   Does your patient have an abnormal blood glucose today? No   Since previous encounter, have you referred patient for abnormal blood glucose that resulted in a new diagnosis or medication change? No   Was there a life-saving intervention made? No     Client stated she has been hav ing frequent seizures. Has appt on tomorrow to see PCP.Futures trader. [address]s [address], RN, [place] [phone].  Vs B/P

## 2016-07-14 NOTE — Congregational Nurse Program (Signed)
Congregational Nurse Program Note  Date of Encounter: 07/14/2016  Past Medical History: Past Medical History:  Diagnosis Date  . Anemia   . Seizure [facility])     Encounter Details:     CNP Questionnaire - 07/14/16 2226      Patient Demographics   Is this a new or existing patient? Existing   Patient is considered a/an Not Applicable   Race African-American/Black     Patient Assistance   Location of Patient Assistance Home of [place], [place]   Patient's financial/insurance status Medicaid;Medicare   Uninsured Patient ([facility]) No   Patient referred to apply for the following financial assistance Not Applicable   Food insecurities addressed Not Applicable   Transportation assistance No   Assistance securing medications No   Educational health offerings Chronic disease     Encounter Details   Primary purpose of visit Chronic Illness/Condition Visit;Post PCP Visit   Was an Emergency Department visit averted? Not Applicable   Does patient have a medical provider? Yes   Patient referred to Not Applicable   Was a mental health screening completed? (GAINS tool) No   Does patient have dental issues? No   Does patient have vision issues? No   Does your patient have an abnormal blood pressure today? No   Since previous encounter, have you referred patient for abnormal blood pressure that resulted in a new diagnosis or medication change? No   Does your patient have an abnormal blood glucose today? No   Since previous encounter, have you referred patient for abnormal blood glucose that resulted in a new diagnosis or medication change? No   Was there a life-saving intervention made? No    Seen at PCP on 07/11/16, instructed to take seizure meds as prescribed. Given new prescription -Omeprazole.No seizure activity since last week Har[place], [place] [phone]    .

## 2016-08-07 ENCOUNTER — Encounter: Payer: Self-pay | Admitting: Adult Health

## 2016-09-19 ENCOUNTER — Telehealth: Payer: Self-pay | Admitting: Adult Health

## 2016-09-19 NOTE — Telephone Encounter (Signed)
Pt called stating that she s not feeling well and needs to speak with a nurse, pt is not our patient yet she has an appointment with [place] on 6/18 for a pregnancy test. Please contact pt

## 2016-09-19 NOTE — Telephone Encounter (Signed)
Patient called stating she is having very light pink spotting, missed her period due on May 26 and is having mild cramping. She is sexually active and is not on BCP. Advised to take a pregnancy test. Patient continued to say she was having spotting. Informed that spotting can occur during the early part of pregnancy and is not uncommon. Informed she could take a test now or wait until her visit. Also made aware that our providers could not prescribe her anything if needed until she was seen. Verbalized understanding.

## 2016-09-22 ENCOUNTER — Encounter: Payer: Self-pay | Admitting: Adult Health

## 2016-09-29 ENCOUNTER — Encounter: Payer: Self-pay | Admitting: Adult Health

## 2016-10-10 ENCOUNTER — Encounter: Payer: Self-pay | Admitting: Adult Health

## 2016-10-10 ENCOUNTER — Ambulatory Visit (INDEPENDENT_AMBULATORY_CARE_PROVIDER_SITE_OTHER): Payer: Medicaid Other | Admitting: Adult Health

## 2016-10-10 VITALS — BP 90/60 | HR 72 | Ht 65.2 in | Wt 170.0 lb

## 2016-10-10 DIAGNOSIS — R112 Nausea with vomiting, unspecified: Secondary | ICD-10-CM | POA: Diagnosis not present

## 2016-10-10 DIAGNOSIS — F329 Major depressive disorder, single episode, unspecified: Secondary | ICD-10-CM

## 2016-10-10 DIAGNOSIS — N926 Irregular menstruation, unspecified: Secondary | ICD-10-CM

## 2016-10-10 DIAGNOSIS — F32A Depression, unspecified: Secondary | ICD-10-CM

## 2016-10-10 DIAGNOSIS — Z3202 Encounter for pregnancy test, result negative: Secondary | ICD-10-CM | POA: Diagnosis not present

## 2016-10-10 LAB — POCT URINE PREGNANCY: Preg Test, Ur: NEGATIVE

## 2016-10-10 MED ORDER — ESCITALOPRAM OXALATE 10 MG PO TABS: 10.0000 mg | ORAL_TABLET | Freq: Every day | ORAL | 6 refills | Status: DC

## 2016-10-10 MED ORDER — PRENATAL PLUS 27-1 MG PO TABS: 1.0000 | ORAL_TABLET | Freq: Every day | ORAL | 12 refills | Status: DC

## 2016-10-10 NOTE — Progress Notes (Signed)
Subjective:     Patient ID: Jamie [name], female   DOB: 1990-09-21, 26 y.o.   MRN: [id number]  HPI Jamie [name] is a 26 year old black female,new to this practice, in for UPT, has missed 2 periods, had some light pink spotting in May, and has had nausea and vomiting.No pain.She says she would like to be pregnant. PCP Health Dept.   Review of Systems Missed periods Nausea and vomiting No pain Reviewed past medical,surgical, social and family history. Reviewed medications and allergies.     Objective:   Physical Exam BP 90/60 (BP Location: Right Arm, Patient Position: Sitting, Cuff Size: Small)   Pulse 72   Ht 5' 5.2" (1.656 m)   Wt 170 lb (77.1 kg)   LMP 08/09/2016   BMI 28.12 kg/m UPT negative. Skin warm and dry. Neck: mid line trachea, normal thyroid, good ROM, no lymphadenopathy noted. Lungs: clear to ausculation bilaterally. Cardiovascular: regular rate and rhythm.Abdomen is soft and non tender, no HSM noted. PHQ 9 score 12, denies being suicidal or homicidal,she says she is depressed and has been on Zoloft and Ambien  in past.    Will check QHCG, CBC,CMP and TSH today, and will start on lexapro.She was instructed to call [facility].  She has phenergan,can take that.  Assessment:     1. Missed periods   2. Intractable vomiting with nausea, unspecified vomiting type   3. Depression, unspecified depression type   4. Pregnancy examination or test, negative result       Plan:     Check CBC,CMP,TSH and QHCG,will talk when results back  Rx prenatal Plus #30 take 1 daily with 12 refills Rx lexapro 10 mg #30 take 1 daily with 6 refills Eat often Follow up in 6 weeks Contact [facility]

## 2016-10-11 LAB — COMPREHENSIVE METABOLIC PANEL
ALT: 11 IU/L (ref 0–32)
AST: 15 IU/L (ref 0–40)
Albumin/Globulin Ratio: 1.4 (ref 1.2–2.2)
Albumin: 4.3 g/dL (ref 3.5–5.5)
Alkaline Phosphatase: 83 IU/L (ref 39–117)
BUN/Creatinine Ratio: 8 — ABNORMAL LOW (ref 9–23)
BUN: 7 mg/dL (ref 6–20)
Bilirubin Total: 0.4 mg/dL (ref 0.0–1.2)
CALCIUM: 9.1 mg/dL (ref 8.7–10.2)
CO2: 23 mmol/L (ref 20–29)
CREATININE: 0.83 mg/dL (ref 0.57–1.00)
Chloride: 104 mmol/L (ref 96–106)
GFR calc Af Amer: 114 mL/min/{1.73_m2} (ref >59)
GFR, EST NON AFRICAN AMERICAN: 99 mL/min/{1.73_m2} (ref >59)
GLUCOSE: 71 mg/dL (ref 65–99)
Globulin, Total: 3 g/dL (ref 1.5–4.5)
Potassium: 4.2 mmol/L (ref 3.5–5.2)
Sodium: 141 mmol/L (ref 134–144)
Total Protein: 7.3 g/dL (ref 6.0–8.5)

## 2016-10-11 LAB — CBC
Hematocrit: 38.1 % (ref 34.0–46.6)
Hemoglobin: 12.4 g/dL (ref 11.1–15.9)
MCH: 27.1 pg (ref 26.6–33.0)
MCHC: 32.5 g/dL (ref 31.5–35.7)
MCV: 83 fL (ref 79–97)
PLATELETS: 241 10*3/uL (ref 150–379)
RBC: 4.58 x10E6/uL (ref 3.77–5.28)
RDW: 14.4 % (ref 12.3–15.4)
WBC: 5.4 10*3/uL (ref 3.4–10.8)

## 2016-10-11 LAB — TSH: TSH: 1.45 u[IU]/mL (ref 0.450–4.500)

## 2016-10-11 LAB — BETA HCG QUANT (REF LAB): hCG Quant: <1 m[IU]/mL

## 2016-10-13 ENCOUNTER — Telehealth: Payer: Self-pay | Admitting: Adult Health

## 2016-10-13 NOTE — Telephone Encounter (Signed)
Pt aware labs were normal and she is not pregnant

## 2016-11-05 ENCOUNTER — Encounter: Payer: Self-pay | Admitting: Adult Health

## 2016-11-05 ENCOUNTER — Ambulatory Visit (INDEPENDENT_AMBULATORY_CARE_PROVIDER_SITE_OTHER): Payer: Medicaid Other | Admitting: Adult Health

## 2016-11-05 VITALS — BP 100/52 | HR 85 | Ht 64.5 in | Wt 169.5 lb

## 2016-11-05 DIAGNOSIS — R5383 Other fatigue: Secondary | ICD-10-CM | POA: Diagnosis not present

## 2016-11-05 DIAGNOSIS — R112 Nausea with vomiting, unspecified: Secondary | ICD-10-CM | POA: Insufficient documentation

## 2016-11-05 DIAGNOSIS — N926 Irregular menstruation, unspecified: Secondary | ICD-10-CM | POA: Diagnosis not present

## 2016-11-05 DIAGNOSIS — F329 Major depressive disorder, single episode, unspecified: Secondary | ICD-10-CM | POA: Diagnosis not present

## 2016-11-05 DIAGNOSIS — Z3202 Encounter for pregnancy test, result negative: Secondary | ICD-10-CM

## 2016-11-05 DIAGNOSIS — F32A Depression, unspecified: Secondary | ICD-10-CM | POA: Insufficient documentation

## 2016-11-05 DIAGNOSIS — R197 Diarrhea, unspecified: Secondary | ICD-10-CM

## 2016-11-05 DIAGNOSIS — R1030 Lower abdominal pain, unspecified: Secondary | ICD-10-CM | POA: Insufficient documentation

## 2016-11-05 LAB — POCT URINE PREGNANCY: Preg Test, Ur: NEGATIVE

## 2016-11-05 LAB — POCT HEMOGLOBIN: Hemoglobin: 12.7 g/dL (ref 12.2–16.2)

## 2016-11-05 MED ORDER — MEDROXYPROGESTERONE ACETATE 10 MG PO TABS: 10.0000 mg | ORAL_TABLET | Freq: Every day | ORAL | 0 refills | Status: DC

## 2016-11-05 MED ORDER — ESCITALOPRAM OXALATE 20 MG PO TABS: 20.0000 mg | ORAL_TABLET | Freq: Every day | ORAL | 6 refills | Status: DC

## 2016-11-05 MED ORDER — ONDANSETRON HCL 4 MG PO TABS: 4.0000 mg | ORAL_TABLET | Freq: Three times a day (TID) | ORAL | 0 refills | Status: DC | PRN

## 2016-11-05 NOTE — Progress Notes (Signed)
Subjective:     Patient ID: Jamie [name], female   DOB: [birth year], 26 y.o.   MRN: [id number]  HPI Jamie [name] is a 26 year old black female back in follow up of starting lexapro and, missing several periods, still no period, and still has nausea and vomiting and had diarrhea yesterday, feels fatigued and not sleeping well, still wants to get pregnant in near future. Labs done in June were normal.She says has low abdominal pain, wonders if has cyst has had in past.   Review of Systems Still no period Depressed Fatigue  Nausea, vomiting and had diarrhea yesterday Low abdominal pain Reviewed past medical,surgical, social and family history. Reviewed medications and allergies.     Objective:   Physical Exam BP (!) 100/52 (BP Location: Left Arm, Patient Position: Sitting, Cuff Size: Normal)   Pulse 85   Ht 5' 4.5" (1.638 m)   Wt 169 lb 8 oz (76.9 kg)   LMP 08/09/2016 (Approximate)   BMI 28.65 kg/m UPT negative, HGB 12.7.  Skin warm and dry. Neck: mid line trachea, normal thyroid, good ROM, no lymphadenopathy noted. Lungs: clear to ausculation bilaterally. Cardiovascular: regular rate and rhythm.Abdomen is soft and mildly tender below navel, no rebound tenderness noted. PHQ 9 score 16, denies being suicidal, is taking lexapro, will increase to 20 mg, pt says she called [facility] but they have not called her back, instructed to call them again she said she will.    Will get [place] to assess uterus and ovaries, if normal and still with symptoms may need CT. Will rx provera to get withdrawal bleed, and give zofran for nausea and vomiting. She is aware may need to go on OCs for 3-4 months to get periods regular.  Assessment:     1. Missed periods   2. Depression, unspecified depression type   3. Fatigue, unspecified type   4. Nausea vomiting and diarrhea   5. Lower abdominal pain   6. Pregnancy examination or test, negative result       Plan:    Can take 2 10 mg lexapro then get 20 mg  Meds  ordered this encounter  Medications  . escitalopram (LEXAPRO) 20 MG tablet    Sig: Take 1 tablet (20 mg total) by mouth daily.    Dispense:  30 tablet    Refill:  6    Order Specific Question:   Supervising Provider    Answer:   Elonda Husky, LUTHER H [[id number]]  . ondansetron (ZOFRAN) 4 MG tablet    Sig: Take 1 tablet (4 mg total) by mouth every 8 (eight) hours as needed for nausea or vomiting.    Dispense:  20 tablet    Refill:  0    Order Specific Question:   Supervising Provider    Answer:   Elonda Husky, LUTHER H [[id number]]  . medroxyPROGESTERone (PROVERA) 10 MG tablet    Sig: Take 1 tablet (10 mg total) by mouth daily.    Dispense:  10 tablet    Refill:  0    Order Specific Question:   Supervising Provider    Answer:   Tania Ade H [[id number]]  Return in 1 week for GYN [place] and then see me the next week  Eat small frequent meals

## 2016-11-12 ENCOUNTER — Encounter (INDEPENDENT_AMBULATORY_CARE_PROVIDER_SITE_OTHER): Payer: Medicaid Other | Admitting: Adult Health

## 2016-11-12 ENCOUNTER — Ambulatory Visit (INDEPENDENT_AMBULATORY_CARE_PROVIDER_SITE_OTHER): Payer: Medicaid Other

## 2016-11-12 DIAGNOSIS — N926 Irregular menstruation, unspecified: Secondary | ICD-10-CM | POA: Diagnosis not present

## 2016-11-12 DIAGNOSIS — R112 Nausea with vomiting, unspecified: Secondary | ICD-10-CM

## 2016-11-12 DIAGNOSIS — R197 Diarrhea, unspecified: Secondary | ICD-10-CM | POA: Diagnosis not present

## 2016-11-12 DIAGNOSIS — R1030 Lower abdominal pain, unspecified: Secondary | ICD-10-CM

## 2016-11-12 NOTE — Progress Notes (Signed)
PELVIC US TA/TV: homogeneous retroflexed uterus,wnl,EEC 9.9 mm,ovaries appear normal, but slightly enlarged bilat,right ovary vol 16 ml,left ovary 20 ml,ovaries appear mobile,no free fluid,no pain during ultrasound.

## 2016-11-13 ENCOUNTER — Telehealth: Payer: Self-pay | Admitting: Adult Health

## 2016-11-13 NOTE — Telephone Encounter (Signed)
Left message that US was normal ?

## 2016-11-18 ENCOUNTER — Telehealth: Payer: Self-pay | Admitting: Adult Health

## 2016-11-18 NOTE — Telephone Encounter (Signed)
Patient called stating that she would like to know her lab results. Please contact pt

## 2016-11-18 NOTE — Telephone Encounter (Signed)
Informed patient [place] was normal per Jennifer's note. Verbalized understanding.

## 2016-11-21 ENCOUNTER — Ambulatory Visit: Payer: Medicaid Other | Admitting: Adult Health

## 2016-12-02 ENCOUNTER — Encounter: Payer: Self-pay | Admitting: Adult Health

## 2016-12-02 ENCOUNTER — Ambulatory Visit (INDEPENDENT_AMBULATORY_CARE_PROVIDER_SITE_OTHER): Payer: Medicaid Other | Admitting: Adult Health

## 2016-12-02 VITALS — BP 112/80 | HR 74 | Ht 65.5 in | Wt 169.0 lb

## 2016-12-02 DIAGNOSIS — Z3202 Encounter for pregnancy test, result negative: Secondary | ICD-10-CM

## 2016-12-02 DIAGNOSIS — N644 Mastodynia: Secondary | ICD-10-CM | POA: Diagnosis not present

## 2016-12-02 DIAGNOSIS — N926 Irregular menstruation, unspecified: Secondary | ICD-10-CM | POA: Diagnosis not present

## 2016-12-02 DIAGNOSIS — R112 Nausea with vomiting, unspecified: Secondary | ICD-10-CM | POA: Diagnosis not present

## 2016-12-02 LAB — POCT URINE PREGNANCY: Preg Test, Ur: NEGATIVE

## 2016-12-02 MED ORDER — ONDANSETRON HCL 4 MG PO TABS: 4.0000 mg | ORAL_TABLET | Freq: Three times a day (TID) | ORAL | 1 refills | Status: DC | PRN

## 2016-12-02 NOTE — Progress Notes (Signed)
Subjective:     Patient ID: Jamie [name], female   DOB: 07-06-1990, 26 y.o.   MRN: [id number]  HPI  Jamie [name] is a 26 year old black female in for UPT, has missed a period and has nausea and vomiting and breast soreness, and sleeps a lot.She wants to be pregnant.  Review of Systems +missed period, did not start after provera Nausea and vomiting Breast sore Sleeps a lot Reviewed past medical,surgical, social and family history. Reviewed medications and allergies.     Objective:   Physical Exam BP 112/80 (BP Location: Left Arm, Patient Position: Sitting, Cuff Size: Normal)   Pulse 74   Ht 5' 5.5" (1.664 m)   Wt 169 lb (76.7 kg)   LMP 10/27/2016 (Approximate)   BMI 27.70 kg/m UPT negative.  Skin warm and dry,  Breasts:no dominate palpable mass, retraction or nipple discharge.Abdomen is soft. Will check labs today, and talk when results back    Assessment:     1. Missed periods   2. Soreness breast   3. Pregnancy examination or test, negative result   4. Intractable vomiting with nausea, unspecified vomiting type       Plan:     Check CBC,CMP,TSH and QHCG Meds ordered this encounter  Medications  . ondansetron (ZOFRAN) 4 MG tablet    Sig: Take 1 tablet (4 mg total) by mouth every 8 (eight) hours as needed for nausea or vomiting.    Dispense:  20 tablet    Refill:  1    Order Specific Question:   Supervising Provider    Answer:   Florian Buff [[id number]]  Will talk when labs back  Follow up prn

## 2016-12-03 LAB — CBC
Hematocrit: 40.8 % (ref 34.0–46.6)
Hemoglobin: 13 g/dL (ref 11.1–15.9)
MCH: 27.8 pg (ref 26.6–33.0)
MCHC: 31.9 g/dL (ref 31.5–35.7)
MCV: 87 fL (ref 79–97)
PLATELETS: 258 10*3/uL (ref 150–379)
RBC: 4.67 x10E6/uL (ref 3.77–5.28)
RDW: 14.1 % (ref 12.3–15.4)
WBC: 8.2 10*3/uL (ref 3.4–10.8)

## 2016-12-03 LAB — COMPREHENSIVE METABOLIC PANEL
A/G RATIO: 1.4 (ref 1.2–2.2)
ALT: 9 IU/L (ref 0–32)
AST: 13 IU/L (ref 0–40)
Albumin: 4.3 g/dL (ref 3.5–5.5)
Alkaline Phosphatase: 75 IU/L (ref 39–117)
BUN/Creatinine Ratio: 10 (ref 9–23)
BUN: 9 mg/dL (ref 6–20)
Bilirubin Total: 0.3 mg/dL (ref 0.0–1.2)
CALCIUM: 9.6 mg/dL (ref 8.7–10.2)
CO2: 22 mmol/L (ref 20–29)
Chloride: 104 mmol/L (ref 96–106)
Creatinine, Ser: 0.86 mg/dL (ref 0.57–1.00)
GFR, EST AFRICAN AMERICAN: 109 mL/min/{1.73_m2} (ref >59)
GFR, EST NON AFRICAN AMERICAN: 94 mL/min/{1.73_m2} (ref >59)
GLOBULIN, TOTAL: 3.1 g/dL (ref 1.5–4.5)
Glucose: 61 mg/dL — ABNORMAL LOW (ref 65–99)
POTASSIUM: 4.4 mmol/L (ref 3.5–5.2)
Sodium: 143 mmol/L (ref 134–144)
TOTAL PROTEIN: 7.4 g/dL (ref 6.0–8.5)

## 2016-12-03 LAB — BETA HCG QUANT (REF LAB): hCG Quant: <1 m[IU]/mL

## 2016-12-03 LAB — TSH: TSH: 1.84 u[IU]/mL (ref 0.450–4.500)

## 2016-12-04 ENCOUNTER — Telehealth: Payer: Self-pay | Admitting: *Deleted

## 2016-12-04 NOTE — Telephone Encounter (Signed)
Spoke with pt. Pt states she has a cold and is requesting something for it. Has had symptoms x 2 weeks. I advised she can run a cool mist humidifier in bedroom when sleeping. Take Sudafed and Robitussin. Pt failed to mention symptoms at her 8/21 visit. Pt states the health dept is who sees her for primary care. I advised she would need to call them if she felt like she needed an antibiotic. Pt voiced understanding. [place]

## 2017-01-12 ENCOUNTER — Telehealth: Payer: Self-pay | Admitting: *Deleted

## 2017-01-12 NOTE — Telephone Encounter (Signed)
Pt called stating that she was having heavy bleeding that started on Sept 28 or 29. She states that she is changing a pad an hour and passing some quarter sized clots. Offered pt an appt tomorrow morning at 915 but pt states that she can not be here then. Advised pt to drink plenty of fluids, stay hydrated, and take an OTC iron supplement. Advised pt that if things had not improved by Wednesday to call us back and we would get her an appt to see a provider. Pt verbalized understanding.

## 2017-01-26 ENCOUNTER — Telehealth: Payer: Self-pay | Admitting: Adult Health

## 2017-01-26 NOTE — Telephone Encounter (Signed)
Unable to leave message

## 2017-01-30 ENCOUNTER — Telehealth: Payer: Self-pay | Admitting: Adult Health

## 2017-01-30 NOTE — Telephone Encounter (Signed)
Returned patients call. Unable to leave message. 

## 2017-01-30 NOTE — Telephone Encounter (Signed)
Pt called stating that she would like to speak with a nurse, Pt did not state the reason why. Please contact pt

## 2017-02-02 ENCOUNTER — Telehealth: Payer: Self-pay | Admitting: Adult Health

## 2017-02-02 NOTE — Telephone Encounter (Signed)
Patient called stating she has had breast tenderness, urinary frequency, weight gain in the last couple of weeks. She had bleeding on Sept 26 but only for 1 day. Her last normal period was August 28. Advised patient to take a pregnancy test and if positive, to schedule an appointment in our office. Pt verbalized understanding.

## 2017-02-02 NOTE — Telephone Encounter (Signed)
Patent called stating that she would like a call back from the nurse, Patient did not state the reason why. Please contact pt

## 2017-02-09 ENCOUNTER — Ambulatory Visit (INDEPENDENT_AMBULATORY_CARE_PROVIDER_SITE_OTHER): Payer: Medicaid Other | Admitting: Adult Health

## 2017-02-09 ENCOUNTER — Encounter: Payer: Self-pay | Admitting: Adult Health

## 2017-02-09 VITALS — BP 118/80 | HR 80 | Ht 65.0 in | Wt 168.5 lb

## 2017-02-09 DIAGNOSIS — F32A Depression, unspecified: Secondary | ICD-10-CM

## 2017-02-09 DIAGNOSIS — R1030 Lower abdominal pain, unspecified: Secondary | ICD-10-CM

## 2017-02-09 DIAGNOSIS — Z3202 Encounter for pregnancy test, result negative: Secondary | ICD-10-CM

## 2017-02-09 DIAGNOSIS — F329 Major depressive disorder, single episode, unspecified: Secondary | ICD-10-CM

## 2017-02-09 DIAGNOSIS — N926 Irregular menstruation, unspecified: Secondary | ICD-10-CM

## 2017-02-09 LAB — POCT URINE PREGNANCY: Preg Test, Ur: NEGATIVE

## 2017-02-09 NOTE — Progress Notes (Signed)
Subjective:     Patient ID: Jamie [name], female   DOB: [birth year], 26 y.o.   MRN: [id number]  HPI Shelisa is a 26 year old black female in complaining of missing period and abdominal pain.She spotted 9/26 1 day only.  Review of Systems +missed period +abdominal pain Still depressed on lexapro, but has appt [place] in am Reviewed past medical,surgical, social and family history. Reviewed medications and allergies.     Objective:   Physical Exam BP 118/80 (BP Location: Left Arm, Patient Position: Sitting, Cuff Size: Normal)   Pulse 80   Ht 5\' 5"  (1.651 m)   Wt 168 lb 8 oz (76.4 kg)   LMP 12/08/2016   BMI 28.04 kg/m UPT negative,skin warm and dry, abdomen is soft, has some epigastric tenderness and also over uterus.Will check QHCG and schedule [place] and pap.     Assessment:     1. Missed periods   2. Lower abdominal pain   3. Depression, unspecified depression type   4. Pregnancy examination or test, negative result       Plan:     Check QHCG  Return in 1 week for GYN [place] then 2-3 days later to see me for pap and physical    Keep appt with [facility] in am

## 2017-02-10 LAB — BETA HCG QUANT (REF LAB): hCG Quant: <1 m[IU]/mL

## 2017-02-12 ENCOUNTER — Telehealth: Payer: Self-pay | Admitting: Obstetrics & Gynecology

## 2017-02-12 NOTE — Telephone Encounter (Signed)
Informed patient Hcg was less than 1 so not pregnant. Pt verbalized gratitude.

## 2017-02-16 ENCOUNTER — Telehealth: Payer: Self-pay | Admitting: *Deleted

## 2017-02-16 ENCOUNTER — Other Ambulatory Visit: Payer: Medicaid Other

## 2017-02-16 NOTE — Telephone Encounter (Signed)
Patient states she would like to be on BCP to have a period since she has not had one since August. Informed patient that she is scheduled for a GYN-US and pap/physical and can be prescribed something at that visit. Pt verbalized understanding.

## 2017-02-17 ENCOUNTER — Encounter: Payer: Self-pay | Admitting: Adult Health

## 2017-02-18 ENCOUNTER — Other Ambulatory Visit: Payer: Medicaid Other | Admitting: Adult Health

## 2017-02-19 ENCOUNTER — Other Ambulatory Visit: Payer: Medicaid Other

## 2017-02-20 ENCOUNTER — Other Ambulatory Visit: Payer: Medicaid Other

## 2017-02-26 ENCOUNTER — Other Ambulatory Visit: Payer: Medicaid Other | Admitting: Adult Health

## 2017-03-02 ENCOUNTER — Other Ambulatory Visit: Payer: Medicaid Other

## 2017-03-02 ENCOUNTER — Telehealth: Payer: Self-pay | Admitting: Adult Health

## 2017-03-02 NOTE — Telephone Encounter (Signed)
Spoke with pt. Pt has a knot on breast and it's getting bigger. Noticed it this am. Red and warm to touch. Pt wonders if she got bit by something. Pt states she has a fever now. She couldn't come in office today. I advised if she gets worse throughout the night, may need to see Urgent Care. Call transferred to front desk for appt tomorrow. Pt voiced understanding. [place]

## 2017-03-02 NOTE — Telephone Encounter (Signed)
Patient called stating that she has a knot on her breast and she noticed the knot this morning, pt states that it hurts when she puts on clothes. Pt states that she is not sure if she got bite by something. Please contact pt

## 2017-03-03 ENCOUNTER — Ambulatory Visit: Payer: Medicaid Other | Admitting: Adult Health

## 2017-03-12 ENCOUNTER — Ambulatory Visit (INDEPENDENT_AMBULATORY_CARE_PROVIDER_SITE_OTHER): Payer: Medicaid Other

## 2017-03-12 DIAGNOSIS — R1030 Lower abdominal pain, unspecified: Secondary | ICD-10-CM

## 2017-03-12 NOTE — Progress Notes (Signed)
PELVIC US TA/TV:homogeneous retroflexed uterus,wnl,bilaterly enlarged ovaries,EEC 6.5 mm,no free fluid,no pain during ultrasound,ovaries appear mobile,limited view on TV because of bowel gas.

## 2017-03-13 ENCOUNTER — Telehealth: Payer: Self-pay | Admitting: *Deleted

## 2017-03-13 NOTE — Telephone Encounter (Signed)
Called patient but unable to leave VM.

## 2017-03-13 NOTE — Telephone Encounter (Signed)
Patient called requesting result of Hcg. Informed patient that result was not in and could take 24 hours to run. Patient states she still has not had a period since 01/08/17 for 3 days only and states "that's not normal, what is she going to do about me not having a period." Informed patient Anderson Malta was out of the office today. Verbalized understanding stating she has appointment Monday.

## 2017-03-16 ENCOUNTER — Other Ambulatory Visit (HOSPITAL_COMMUNITY)
Admission: RE | Admit: 2017-03-16 | Discharge: 2017-03-16 | Disposition: A | Discharge status: Home / Self Care | Payer: Medicaid Other | Source: Ambulatory Visit | Attending: Adult Health | Admitting: Adult Health

## 2017-03-16 ENCOUNTER — Ambulatory Visit: Payer: Medicaid Other | Admitting: Adult Health

## 2017-03-16 ENCOUNTER — Encounter: Payer: Self-pay | Admitting: Adult Health

## 2017-03-16 ENCOUNTER — Telehealth: Payer: Self-pay | Admitting: *Deleted

## 2017-03-16 VITALS — BP 100/70 | HR 97 | Ht 64.2 in | Wt 171.0 lb

## 2017-03-16 DIAGNOSIS — Z0001 Encounter for general adult medical examination with abnormal findings: Secondary | ICD-10-CM

## 2017-03-16 DIAGNOSIS — F32A Depression, unspecified: Secondary | ICD-10-CM

## 2017-03-16 DIAGNOSIS — F329 Major depressive disorder, single episode, unspecified: Secondary | ICD-10-CM

## 2017-03-16 DIAGNOSIS — Z01411 Encounter for gynecological examination (general) (routine) with abnormal findings: Secondary | ICD-10-CM | POA: Diagnosis not present

## 2017-03-16 DIAGNOSIS — N926 Irregular menstruation, unspecified: Secondary | ICD-10-CM

## 2017-03-16 DIAGNOSIS — N611 Abscess of the breast and nipple: Secondary | ICD-10-CM | POA: Diagnosis not present

## 2017-03-16 DIAGNOSIS — Z3202 Encounter for pregnancy test, result negative: Secondary | ICD-10-CM

## 2017-03-16 DIAGNOSIS — Z01419 Encounter for gynecological examination (general) (routine) without abnormal findings: Secondary | ICD-10-CM

## 2017-03-16 DIAGNOSIS — Z3169 Encounter for other general counseling and advice on procreation: Secondary | ICD-10-CM | POA: Diagnosis not present

## 2017-03-16 DIAGNOSIS — Z319 Encounter for procreative management, unspecified: Secondary | ICD-10-CM

## 2017-03-16 LAB — POCT URINE PREGNANCY: Preg Test, Ur: NEGATIVE

## 2017-03-16 MED ORDER — SULFAMETHOXAZOLE-TRIMETHOPRIM 800-160 MG PO TABS
1.0000 | ORAL_TABLET | Freq: Two times a day (BID) | ORAL | 0 refills | Status: DC
Start: 2017-03-16 — End: 2017-04-02

## 2017-03-16 NOTE — Addendum Note (Signed)
Addended by: Diona Fanti A on: 03/16/2017 04:15 PM   Modules accepted: Orders

## 2017-03-16 NOTE — Telephone Encounter (Signed)
Patient calling asking for results of u/s. Informed patient that MD had not yet reviewed it but when she came for her appt today, Anderson Malta could go over the U/S. Patient also asking what the appt for today was for. Informed patient that at last note, Anderson Malta said she needed pap/physical. Patient stated she would make her appointment.
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
## 2017-03-18 LAB — CYTOLOGY - PAP
Adequacy: ABSENT
Chlamydia: NEGATIVE
Diagnosis: NEGATIVE
Neisseria Gonorrhea: NEGATIVE

## 2017-03-20 ENCOUNTER — Telehealth: Payer: Self-pay | Admitting: *Deleted

## 2017-03-20 NOTE — Telephone Encounter (Signed)
Patient called for results and informed pap was normal and negative for gc/chl. Verbalized understanding.

## 2017-03-26 ENCOUNTER — Other Ambulatory Visit: Payer: Medicaid Other | Admitting: Adult Health

## 2017-03-26 ENCOUNTER — Telehealth: Payer: Self-pay | Admitting: *Deleted

## 2017-03-26 NOTE — Telephone Encounter (Signed)
Pt called and was spotting last night and having cramps,Ok to use tylenol and heating pad.. She has appt 12/17 to recheck breast and she says it is good.

## 2017-03-30 ENCOUNTER — Ambulatory Visit: Payer: Medicaid Other | Admitting: Adult Health

## 2017-04-02 ENCOUNTER — Encounter: Payer: Self-pay | Admitting: Adult Health

## 2017-04-02 ENCOUNTER — Ambulatory Visit (INDEPENDENT_AMBULATORY_CARE_PROVIDER_SITE_OTHER): Payer: Medicaid Other | Admitting: Adult Health

## 2017-04-02 VITALS — BP 108/80 | HR 79 | Ht 64.5 in | Wt 168.5 lb

## 2017-04-02 DIAGNOSIS — Z319 Encounter for procreative management, unspecified: Secondary | ICD-10-CM

## 2017-04-02 DIAGNOSIS — Z3169 Encounter for other general counseling and advice on procreation: Secondary | ICD-10-CM | POA: Diagnosis not present

## 2017-04-02 DIAGNOSIS — N926 Irregular menstruation, unspecified: Secondary | ICD-10-CM | POA: Diagnosis not present

## 2017-04-02 NOTE — Progress Notes (Signed)
Subjective:     Patient ID: Jamie [name], female   DOB: Aug 08, 1990, 26 y.o.   MRN: [id number]  HPI Jamie [name] is a 26 year old black female back in follow up on left breast abscess and it has resolved, and she spotted 1 day in December, first bleeding since September an she wants to be pregnant.   Review of Systems Spotted 1 day in December Breast abscess resolved Reviewed past medical,surgical, social and family history. Reviewed medications and allergies.     Objective:   Physical Exam BP 108/80 (BP Location: Left Arm, Patient Position: Sitting, Cuff Size: Normal)   Pulse 79   Ht 5' 4.5" (1.638 m)   Wt 168 lb 8 oz (76.4 kg)   LMP 03/27/2017 Comment: spotted  BMI 28.48 kg/m   Skin warm and dry,  Breasts:no dominate palpable mass, retraction or nipple discharge, abscess at 2 o'clock resolved, slight firmness still there, non tender.     Assessment:     Irregular periods Desires pregnancy Resolved left breast abscess    Plan:    Keep period calendar and one given to her Check progesterone level 1/319, orders given Have sex every other day 7-24 of cycle Take PNV F/U prn

## 2017-04-09 ENCOUNTER — Telehealth: Payer: Self-pay | Admitting: *Deleted

## 2017-04-09 NOTE — Telephone Encounter (Signed)
Has spotted some more, check progesterone level 04/16/17

## 2017-04-16 ENCOUNTER — Telehealth: Payer: Self-pay | Admitting: *Deleted

## 2017-04-16 NOTE — Telephone Encounter (Signed)
Informed patient lab has not been resulted but will check for result in am.

## 2017-04-17 LAB — PROGESTERONE

## 2017-04-17 NOTE — Telephone Encounter (Signed)
Attempted to call patient with progesterone results but unable to leave VM.

## 2017-04-20 ENCOUNTER — Telehealth: Payer: Self-pay | Admitting: *Deleted

## 2017-04-20 NOTE — Telephone Encounter (Signed)
Informed patient that she did not ovulate so if she wanted to be started on Clomid, she would need to make an appointment for discussion and counseling. Pt stated she would like to make an appointment.

## 2017-04-29 ENCOUNTER — Ambulatory Visit (INDEPENDENT_AMBULATORY_CARE_PROVIDER_SITE_OTHER): Payer: Medicaid Other | Admitting: Adult Health

## 2017-04-29 ENCOUNTER — Encounter: Payer: Self-pay | Admitting: Adult Health

## 2017-04-29 ENCOUNTER — Telehealth: Payer: Self-pay | Admitting: Obstetrics & Gynecology

## 2017-04-29 VITALS — BP 120/90 | HR 76 | Ht 64.5 in | Wt 173.0 lb

## 2017-04-29 DIAGNOSIS — Z3202 Encounter for pregnancy test, result negative: Secondary | ICD-10-CM

## 2017-04-29 DIAGNOSIS — Z319 Encounter for procreative management, unspecified: Secondary | ICD-10-CM

## 2017-04-29 DIAGNOSIS — Z3169 Encounter for other general counseling and advice on procreation: Secondary | ICD-10-CM | POA: Diagnosis not present

## 2017-04-29 DIAGNOSIS — N926 Irregular menstruation, unspecified: Secondary | ICD-10-CM

## 2017-04-29 LAB — POCT URINE PREGNANCY: Preg Test, Ur: NEGATIVE

## 2017-04-29 MED ORDER — CLOMIPHENE CITRATE 50 MG PO TABS: 50.0000 mg | ORAL_TABLET | Freq: Every day | ORAL | 2 refills | Status: DC

## 2017-04-29 NOTE — Progress Notes (Signed)
Subjective:     Patient ID: Jamie [name], female   DOB: Dec 10, 1990, 27 y.o.   MRN: [id number]  HPI Jamie [name] is a 27 year old black female in to discuss starting clomid, her periods are irregular and progesterone levels are non ovulatory.  Review of Systems Irregular periods Reviewed past medical,surgical, social and family history. Reviewed medications and allergies.     Objective:   Physical Exam BP 120/90 (BP Location: Right Arm, Patient Position: Sitting, Cuff Size: Normal)   Pulse 76   Ht 5' 4.5" (1.638 m)   Wt 173 lb (78.5 kg)   BMI 29.24 kg/m UPT negative. Talk only: discussed clomid and she wants to start now, will start today, check progesterone level 05/18/17.Discussed timing os sex.    Assessment:     1. Irregular periods   2. Patient desires pregnancy       Plan:    Check Progesterone level 05/18/17 Meds ordered this encounter  Medications  . clomiPHENE (CLOMID) 50 MG tablet    Sig: Take 1 tablet (50 mg total) by mouth daily.    Dispense:  50 tablet    Refill:  2    Order Specific Question:   Supervising Provider    Answer:   Tania Ade H [[id number]]     F/U prn

## 2017-04-29 NOTE — Patient Instructions (Signed)
Clomiphene tablets What is this medicine? CLOMIPHENE (KLOE mi feen) is a fertility drug that increases the chance of pregnancy. It helps women ovulate (produce a mature egg) during their cycle. This medicine may be used for other purposes; ask your health care provider or pharmacist if you have questions. COMMON BRAND NAME(S): Clomid, Serophene What should I tell my health care provider before I take this medicine? They need to know if you have any of these conditions: -adrenal gland disease -blood vessel disease or blood clots -cyst on the ovary -endometriosis -liver disease -ovarian cancer -pituitary gland disease -vaginal bleeding that has not been evaluated -an unusual or allergic reaction to clomiphene, other medicines, foods, dyes, or preservatives -pregnant (should not be used if you are already pregnant) -breast-feeding How should I use this medicine? Take this medicine by mouth with a glass of water. Follow the directions on the prescription label. Take exactly as directed for the exact number of days prescribed. Take your doses at regular intervals. Most women take this medicine for a 5 day period, but the length of treatment may be adjusted. Your doctor will give you a start date for this medication and will give you instructions on proper use. Do not take your medicine more often than directed. Talk to your pediatrician regarding the use of this medicine in children. Special care may be needed. Overdosage: If you think you have taken too much of this medicine contact a poison control center or emergency room at once. NOTE: This medicine is only for you. Do not share this medicine with others. What if I miss a dose? If you miss a dose, take it as soon as you can. If it is almost time for your next dose, take only that dose. Do not take double or extra doses. What may interact with this medicine? -herbal or dietary supplements, like blue cohosh, black cohosh, chasteberry, or  DHEA -prasterone This list may not describe all possible interactions. Give your health care provider a list of all the medicines, herbs, non-prescription drugs, or dietary supplements you use. Also tell them if you smoke, drink alcohol, or use illegal drugs. Some items may interact with your medicine. What should I watch for while using this medicine? Make sure you understand how and when to use this medicine. You need to know when you are ovulating and when to have sexual intercourse. This will increase the chance of a pregnancy. Visit your doctor or health care professional for regular checks on your progress. You may need tests to check the hormone levels in your blood or you may have to use home-urine tests to check for ovulation. Try to keep any appointments. Compared to other fertility treatments, this medicine does not greatly increase your chances of having multiple babies. An increased chance of having twins may occur in roughly 5 out of every 100 women who take this medication. Stop taking this medicine at once and contact your doctor or health care professional if you think you are pregnant. This medicine is not for long-term use. Most women that benefit from this medicine do so within the first three cycles (months). Your doctor or health care professional will monitor your condition. This medicine is usually used for a total of 6 cycles of treatment. You may get drowsy or dizzy. Do not drive, use machinery, or do anything that needs mental alertness until you know how this drug affects you. Do not stand or sit up quickly. This reduces the risk of dizzy or   fainting spells. Drinking alcoholic beverages or smoking tobacco may decrease your chance of becoming pregnant. Limit or stop alcohol and tobacco use during your fertility treatments. What side effects may I notice from receiving this medicine? Side effects that you should report to your doctor or health care professional as soon as  possible: -allergic reactions like skin rash, itching or hives, swelling of the face, lips, or tongue -breathing problems -changes in vision -fluid retention -nausea, vomiting -pelvic pain or bloating -severe abdominal pain -sudden weight gain Side effects that usually do not require medical attention (report to your doctor or health care professional if they continue or are bothersome): -breast discomfort -hot flashes -mild pelvic discomfort -mild nausea This list may not describe all possible side effects. Call your doctor for medical advice about side effects. You may report side effects to FDA at 1-800-FDA-1088. Where should I keep my medicine? Keep out of the reach of children. Store at room temperature between 15 and 30 degrees C (59 and 86 degrees F). Protect from heat, light, and moisture. Throw away any unused medicine after the expiration date. NOTE: This sheet is a summary. It may not cover all possible information. If you have questions about this medicine, talk to your doctor, pharmacist, or health care provider.  2018 Elsevier/Gold Standard (2007-07-12 22:21:06)  

## 2017-04-29 NOTE — Telephone Encounter (Signed)
Patient informed Medicaid will not cover Clomid and will have to pay out of pocket. Patient stated she did not know if she had the money now but would call pharmacy to see how much it was. States she is scheduled for blood work on 2/4. Informed she would nee to reschedule that appointment. Stated she would call us back.

## 2017-05-05 ENCOUNTER — Telehealth: Payer: Self-pay | Admitting: Adult Health

## 2017-05-05 NOTE — Telephone Encounter (Signed)
Patient states she has not been able to start Clomid but is going to start on 1/28. Patient is scheduled for progesterone on 2/4. I informed patient that she would need to change appointment for lab since she is starting later.  Patient needs to come 21 days after start medication, correct? Please advise.

## 2017-05-06 NOTE — Telephone Encounter (Signed)
She did not start clomid, but will on Monday when gets paid, so check progesterone level 05/29/17

## 2017-05-25 ENCOUNTER — Telehealth: Payer: Self-pay | Admitting: Adult Health

## 2017-05-25 NOTE — Telephone Encounter (Signed)
Pt reports that she is having pelvic pain for the past two days. Pt reports that she has been having sex frequently. She has tried tylenol for the pain with no relief. She would like to know what else to do.

## 2017-05-25 NOTE — Telephone Encounter (Signed)
Has had stomach pain for 2 days does not think is is virus, will get [place] to assess ovaries 2/15 at 10:30 has taken clomid once

## 2017-05-28 ENCOUNTER — Other Ambulatory Visit: Payer: Self-pay | Admitting: Adult Health

## 2017-05-28 DIAGNOSIS — R102 Pelvic and perineal pain: Secondary | ICD-10-CM

## 2017-05-29 ENCOUNTER — Other Ambulatory Visit: Payer: Self-pay | Admitting: Adult Health

## 2017-05-29 ENCOUNTER — Ambulatory Visit (INDEPENDENT_AMBULATORY_CARE_PROVIDER_SITE_OTHER): Payer: Medicaid Other

## 2017-05-29 DIAGNOSIS — R102 Pelvic and perineal pain: Secondary | ICD-10-CM

## 2017-05-29 DIAGNOSIS — N83202 Unspecified ovarian cyst, left side: Secondary | ICD-10-CM

## 2017-05-29 DIAGNOSIS — N83201 Unspecified ovarian cyst, right side: Secondary | ICD-10-CM

## 2017-05-29 NOTE — Progress Notes (Signed)
PELVIC US TA/TV: homogeneous retroverted uterus,wnl,EEC 18 mm,three right ovarian cyst (#1) hemorrhagic cyst  6 x 3.7 x 5.3 cm,(#2)simple cyst 4.1 x 1.9 x 3.4 cm,(#3) hemorrhagic cyst 4.2 x 1.6 x 3.2 cm,hemorrhagic left ovarian cyst 5.2 x 4.5 x 4.9 cm,bilat adnexal pain,no free fluid, ovarian arterial and venous flow visualized

## 2017-05-30 LAB — PROGESTERONE: Progesterone: 9 ng/mL

## 2017-06-01 ENCOUNTER — Telehealth: Payer: Self-pay | Admitting: Obstetrics & Gynecology

## 2017-06-01 MED ORDER — MEGESTROL ACETATE 40 MG PO TABS: 40.0000 mg | ORAL_TABLET | Freq: Every day | ORAL | 3 refills | Status: DC

## 2017-06-01 NOTE — Telephone Encounter (Signed)
Pt aware US showed several hemorrhagic cysts, will rx megace 40 mg 1 daily to suppress ovaries,and recheck [place] in 3 months,  no more clomid for now and progesterone level was 9. Check HPT before starting megace.

## 2017-06-03 ENCOUNTER — Telehealth: Payer: Self-pay | Admitting: Adult Health

## 2017-06-03 NOTE — Telephone Encounter (Signed)
Patient called stating that she would like a call back from Jennifer, Patient did not state the reason why. Please contact pt °

## 2017-06-03 NOTE — Telephone Encounter (Signed)
Pt called asking what medications she could take for the cold she has. Pt states that Anderson Malta is helping her to try and get pregnant and told her not to take just anything. Pt states that her symptoms are runny nose, cough, and sneezing. Spoke with Anderson Malta and advised pt that Anderson Malta recommends she try Tylenol, delsym, and/or zyrtec. Pt verbalized understanding.

## 2017-06-15 ENCOUNTER — Telehealth: Payer: Self-pay | Admitting: Adult Health

## 2017-06-15 NOTE — Telephone Encounter (Signed)
Patient called stating that she would like a call back from Jennifer, Patient did not state the reason for the call. Please contact pt 

## 2017-06-15 NOTE — Telephone Encounter (Signed)
Pt has had period and has not started megace, seems confused about prior conversation and then hung up

## 2017-08-11 ENCOUNTER — Telehealth: Payer: Self-pay | Admitting: Adult Health

## 2017-08-11 MED ORDER — PROMETHAZINE HCL 25 MG PO TABS: 25.0000 mg | ORAL_TABLET | Freq: Four times a day (QID) | ORAL | 1 refills | Status: DC | PRN

## 2017-08-11 NOTE — Addendum Note (Signed)
Addended by: Derrek Monaco A on: 08/11/2017 02:09 PM   Modules accepted: Orders

## 2017-08-11 NOTE — Telephone Encounter (Signed)
She complains of stomach pain and nausea and vomiting, to make appt and will rx phenergan appt 5/9 at 9:30 am and she already has [place] 5/20

## 2017-08-11 NOTE — Telephone Encounter (Signed)
Patient states she is throwing up everything she eats and her stomach hurts.  Patient very vague and would not discuss further.

## 2017-08-20 ENCOUNTER — Ambulatory Visit: Payer: Medicaid Other | Admitting: Adult Health

## 2017-08-27 ENCOUNTER — Telehealth: Payer: Self-pay | Admitting: Adult Health

## 2017-08-27 MED ORDER — ONDANSETRON HCL 8 MG PO TABS: 8.0000 mg | ORAL_TABLET | Freq: Three times a day (TID) | ORAL | 1 refills | Status: DC | PRN

## 2017-08-27 NOTE — Telephone Encounter (Signed)
Pt says phenergan not really helping her nausea, is sill having nausea and vomiting esp in am, will Rx zofran, keep [place] appt

## 2017-08-27 NOTE — Telephone Encounter (Signed)
Has appt for [place] on Monday and needs to talk to [employer] about nausea she is having/ pt states not preg

## 2017-08-28 ENCOUNTER — Other Ambulatory Visit: Payer: Self-pay | Admitting: Adult Health

## 2017-08-28 DIAGNOSIS — N83201 Unspecified ovarian cyst, right side: Secondary | ICD-10-CM

## 2017-08-28 DIAGNOSIS — N83202 Unspecified ovarian cyst, left side: Principal | ICD-10-CM

## 2017-08-31 ENCOUNTER — Encounter: Payer: Self-pay | Admitting: *Deleted

## 2017-08-31 ENCOUNTER — Other Ambulatory Visit: Payer: Medicaid Other

## 2017-09-08 ENCOUNTER — Ambulatory Visit: Payer: Medicaid Other | Admitting: Adult Health

## 2017-09-22 ENCOUNTER — Encounter: Payer: Self-pay | Admitting: Adult Health

## 2017-09-22 ENCOUNTER — Ambulatory Visit: Payer: Medicaid Other | Admitting: Adult Health

## 2017-10-26 ENCOUNTER — Ambulatory Visit: Payer: Medicaid Other | Admitting: Adult Health

## 2017-11-09 ENCOUNTER — Telehealth: Payer: Self-pay | Admitting: Obstetrics & Gynecology

## 2017-11-09 NOTE — Telephone Encounter (Signed)
Pt calls stating that she missed her period on July 25th. She c/o nausea, states that her breasts are sore and c/o tightening in her abdomen. Advised pt to take a pregnancy test. Advised to call and make an appt for pregnancy test to be done here if it is positive or make a gyn appt if it is neg. Pt verbalized understanding.

## 2017-11-20 ENCOUNTER — Other Ambulatory Visit: Payer: Self-pay | Admitting: Adult Health

## 2017-11-25 ENCOUNTER — Encounter: Payer: Self-pay | Admitting: Adult Health

## 2017-11-25 ENCOUNTER — Ambulatory Visit (INDEPENDENT_AMBULATORY_CARE_PROVIDER_SITE_OTHER): Payer: Medicaid Other | Admitting: Adult Health

## 2017-11-25 VITALS — BP 112/70 | HR 79 | Ht 64.5 in | Wt 170.5 lb

## 2017-11-25 DIAGNOSIS — N926 Irregular menstruation, unspecified: Secondary | ICD-10-CM

## 2017-11-25 DIAGNOSIS — R1031 Right lower quadrant pain: Secondary | ICD-10-CM | POA: Diagnosis not present

## 2017-11-25 DIAGNOSIS — Z3202 Encounter for pregnancy test, result negative: Secondary | ICD-10-CM

## 2017-11-25 DIAGNOSIS — Z8742 Personal history of other diseases of the female genital tract: Secondary | ICD-10-CM | POA: Diagnosis not present

## 2017-11-25 DIAGNOSIS — N6323 Unspecified lump in the left breast, lower outer quadrant: Secondary | ICD-10-CM | POA: Insufficient documentation

## 2017-11-25 LAB — POCT URINE PREGNANCY: PREG TEST UR: NEGATIVE

## 2017-11-25 NOTE — Progress Notes (Addendum)
  Subjective:     Patient ID: Jamie [name], female   DOB: [birth year], 27 y.o.   MRN: [id number]  HPI Harlyn is a 27 year old black female in for UPT, she has missed a period.They had been regular.And she has pain in left breast, esp if laying on it.   Review of Systems Has missed a period +stomach hurts +pain in left breast Reviewed past medical,surgical, social and family history. Reviewed medications and allergies.     Objective:   Physical Exam BP 112/70 (BP Location: Left Arm, Patient Position: Sitting, Cuff Size: Normal)   Pulse 79   Ht 5' 4.5" (1.638 m)   Wt 170 lb 8 oz (77.3 kg)   LMP 10/17/2017 Comment: period late  Breastfeeding? No   BMI 28.81 kg/m   UPT negative. Skin warm and dry,  Breasts:no dominate palpable mass, retraction or nipple discharge on right, on left no retraction or nipple discharge, but has firm 2 cm mass at edge of breasts at about 5 o'clock, it is tender.No redness or color changes noted at area of mass, does have several dark areas on left breast. Pelvic: external genitalia is normal in appearance no lesions, vagina: white discharge without odor,urethra has no lesions or masses noted, cervix:smooth and bulbous, uterus: normal size, shape and contour, mildly tender, no masses felt, adnexa: no masses + tenderness noted, RLQ. Bladder is non tender and no masses felt.  Will get [place] of left breast to assess, and will get pelvic US to assess RLQ has history of cyst, and failed to get follow up US.  Examination chaperoned by Levy Pupa, LPN.    Assessment:     1. Missed period   2. RLQ abdominal pain   3. Mass of lower outer quadrant of left breast   4. History of ovarian cyst   5. Pregnancy examination or test, negative result       Plan:     Left breast US 8/20 at 4:3- pm at [facility] Return in 1 week for GYN [place]

## 2017-12-01 ENCOUNTER — Ambulatory Visit (HOSPITAL_COMMUNITY): Admission: RE | Admit: 2017-12-01 | Payer: Medicaid Other | Source: Ambulatory Visit

## 2017-12-03 ENCOUNTER — Other Ambulatory Visit: Payer: Medicaid Other

## 2018-01-25 ENCOUNTER — Emergency Department (HOSPITAL_COMMUNITY): Payer: Medicaid Other

## 2018-01-25 ENCOUNTER — Emergency Department (HOSPITAL_COMMUNITY)
Admission: EM | Admit: 2018-01-25 | Discharge: 2018-01-26 | Disposition: A | Discharge status: Home / Self Care | Payer: Medicaid Other | Attending: Emergency Medicine | Admitting: Emergency Medicine

## 2018-01-25 ENCOUNTER — Other Ambulatory Visit: Payer: Self-pay

## 2018-01-25 ENCOUNTER — Encounter (HOSPITAL_COMMUNITY): Payer: Self-pay | Admitting: *Deleted

## 2018-01-25 DIAGNOSIS — Z87891 Personal history of nicotine dependence: Secondary | ICD-10-CM | POA: Insufficient documentation

## 2018-01-25 DIAGNOSIS — F99 Mental disorder, not otherwise specified: Secondary | ICD-10-CM | POA: Diagnosis not present

## 2018-01-25 DIAGNOSIS — R51 Headache: Secondary | ICD-10-CM | POA: Insufficient documentation

## 2018-01-25 DIAGNOSIS — J45909 Unspecified asthma, uncomplicated: Secondary | ICD-10-CM | POA: Diagnosis not present

## 2018-01-25 DIAGNOSIS — Z79899 Other long term (current) drug therapy: Secondary | ICD-10-CM | POA: Insufficient documentation

## 2018-01-25 LAB — I-STAT BETA HCG BLOOD, ED (MC, WL, AP ONLY): I-stat hCG, quantitative: <5 m[IU]/mL (ref <5)
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
## 2018-01-26 MED ORDER — HYDROCODONE-ACETAMINOPHEN 5-325 MG PO TABS: 1.0000 | ORAL_TABLET | Freq: Four times a day (QID) | ORAL | 0 refills | Status: DC | PRN
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
## 2018-02-12 ENCOUNTER — Telehealth: Payer: Self-pay | Admitting: Adult Health

## 2018-02-12 NOTE — Telephone Encounter (Signed)
Would like for Jenn to give her a call today

## 2018-02-12 NOTE — Telephone Encounter (Signed)
Pt has +HPT, make appt for next week

## 2018-02-17 ENCOUNTER — Ambulatory Visit: Payer: Medicaid Other | Admitting: Adult Health

## 2018-02-17 ENCOUNTER — Encounter: Payer: Self-pay | Admitting: *Deleted

## 2018-03-15 ENCOUNTER — Telehealth: Payer: Self-pay | Admitting: *Deleted

## 2018-03-15 NOTE — Telephone Encounter (Signed)
Pt lives in [place] now, she says periods more regular but not sure if ovulating, told her to make appt, she says not sure if she can get here, told her she could see somebody in [place] if more convenient,jsust decide what she wanted to do.

## 2018-04-09 ENCOUNTER — Encounter (HOSPITAL_COMMUNITY): Payer: Self-pay | Admitting: *Deleted

## 2018-04-09 ENCOUNTER — Emergency Department (HOSPITAL_COMMUNITY)
Admission: EM | Admit: 2018-04-09 | Discharge: 2018-04-09 | Disposition: A | Discharge status: Home / Self Care | Payer: Medicaid Other | Attending: Emergency Medicine | Admitting: Emergency Medicine

## 2018-04-09 DIAGNOSIS — R319 Hematuria, unspecified: Secondary | ICD-10-CM | POA: Diagnosis present

## 2018-04-09 DIAGNOSIS — Z79899 Other long term (current) drug therapy: Secondary | ICD-10-CM | POA: Insufficient documentation

## 2018-04-09 DIAGNOSIS — R102 Pelvic and perineal pain: Secondary | ICD-10-CM | POA: Insufficient documentation

## 2018-04-09 DIAGNOSIS — J45909 Unspecified asthma, uncomplicated: Secondary | ICD-10-CM | POA: Insufficient documentation

## 2018-04-09 DIAGNOSIS — N938 Other specified abnormal uterine and vaginal bleeding: Secondary | ICD-10-CM | POA: Insufficient documentation

## 2018-04-09 DIAGNOSIS — Z87891 Personal history of nicotine dependence: Secondary | ICD-10-CM | POA: Diagnosis not present

## 2018-04-09 LAB — CBC WITH DIFFERENTIAL/PLATELET
Abs Immature Granulocytes: 0.01 10*3/uL (ref 0.00–0.07)
BASOS ABS: 0.1 10*3/uL (ref 0.0–0.1)
Basophils Relative: 1 %
EOS ABS: 0.1 10*3/uL (ref 0.0–0.5)
EOS PCT: 2 %
HEMATOCRIT: 39.7 % (ref 36.0–46.0)
HEMOGLOBIN: 12.9 g/dL (ref 12.0–15.0)
Immature Granulocytes: 0 %
LYMPHS ABS: 2.6 10*3/uL (ref 0.7–4.0)
LYMPHS PCT: 38 %
MCH: 28.5 pg (ref 26.0–34.0)
MCHC: 32.5 g/dL (ref 30.0–36.0)
MCV: 87.8 fL (ref 80.0–100.0)
MONOS PCT: 8 %
Monocytes Absolute: 0.5 10*3/uL (ref 0.1–1.0)
NEUTROS PCT: 51 %
Neutro Abs: 3.6 10*3/uL (ref 1.7–7.7)
Platelets: 275 10*3/uL (ref 150–400)
RBC: 4.52 MIL/uL (ref 3.87–5.11)
RDW: 12.3 % (ref 11.5–15.5)
WBC: 6.9 10*3/uL (ref 4.0–10.5)
nRBC: 0 % (ref 0.0–0.2)

## 2018-04-09 LAB — COMPREHENSIVE METABOLIC PANEL
ALBUMIN: 3.8 g/dL (ref 3.5–5.0)
ALK PHOS: 58 U/L (ref 38–126)
ALT: 12 U/L (ref 0–44)
ANION GAP: 9 (ref 5–15)
AST: 17 U/L (ref 15–41)
BILIRUBIN TOTAL: 0.4 mg/dL (ref 0.3–1.2)
BUN: 8 mg/dL (ref 6–20)
CALCIUM: 9.2 mg/dL (ref 8.9–10.3)
CO2: 25 mmol/L (ref 22–32)
Chloride: 108 mmol/L (ref 98–111)
Creatinine, Ser: 0.89 mg/dL (ref 0.44–1.00)
GFR calc Af Amer: >60 mL/min (ref >60)
GLUCOSE: 78 mg/dL (ref 70–99)
Potassium: 3.7 mmol/L (ref 3.5–5.1)
Sodium: 142 mmol/L (ref 135–145)
Total Protein: 7.4 g/dL (ref 6.5–8.1)

## 2018-04-09 LAB — URINALYSIS, MICROSCOPIC (REFLEX): RBC / HPF: >50 RBC/hpf (ref 0–5)

## 2018-04-09 LAB — URINALYSIS, ROUTINE W REFLEX MICROSCOPIC

## 2018-04-09 LAB — WET PREP, GENITAL
Clue Cells Wet Prep HPF POC: NONE SEEN
Sperm: NONE SEEN
Trich, Wet Prep: NONE SEEN
Yeast Wet Prep HPF POC: NONE SEEN

## 2018-04-09 LAB — ABO/RH: ABO/RH(D): B POS

## 2018-04-09 LAB — TYPE AND SCREEN
ABO/RH(D): B POS
Antibody Screen: NEGATIVE

## 2018-04-09 LAB — I-STAT BETA HCG BLOOD, ED (MC, WL, AP ONLY): I-stat hCG, quantitative: <5 m[IU]/mL (ref <5)

## 2018-04-09 MED ORDER — SODIUM CHLORIDE 0.9 % IV BOLUS
1000.0000 mL | Freq: Once | INTRAVENOUS | Status: AC
  Administered 2018-04-09: 1000 mL via INTRAVENOUS

## 2018-04-09 MED ORDER — ONDANSETRON HCL 4 MG/2ML IJ SOLN
4.0000 mg | Freq: Once | INTRAMUSCULAR | Status: AC
  Administered 2018-04-09: 4 mg via INTRAVENOUS
  Filled 2018-04-09: qty 2

## 2018-04-09 MED ORDER — MEDROXYPROGESTERONE ACETATE 10 MG PO TABS: 10.0000 mg | ORAL_TABLET | Freq: Every day | ORAL | 0 refills | Status: DC

## 2018-04-09 MED ORDER — IBUPROFEN 600 MG PO TABS: 600.0000 mg | ORAL_TABLET | Freq: Four times a day (QID) | ORAL | 0 refills | Status: DC | PRN

## 2018-04-09 NOTE — ED Notes (Signed)
Pelvic cart set up and at bedside. 

## 2018-04-09 NOTE — ED Notes (Signed)
Patient verbalizes understanding of discharge instructions. Opportunity for questioning and answers were provided. 

## 2018-04-09 NOTE — ED Provider Notes (Signed)
[facility] EMERGENCY DEPARTMENT Provider Note   CSN: [id number] Arrival date & time: 04/09/18  1303     History   Chief Complaint Chief Complaint  Patient presents with  . Abnormal Lab  . Hematuria    HPI Jamie [name] is a 27 y.o. female.  Pt presents to the ED today with bleeding.  She is not sure if it is from her urine or from her vagina.  She notices it when she urinates.  She also feels nauseous and feels dizzy.  She said her LMP was at the end of November.  She thinks sx have been going on for about 2 weeks.  Pt denies any pain.  Pt does have an obgyn, but has not seen her.     Past Medical History:  Diagnosis Date  . Anemia   . Asthma   . Mental disorder    depression  . Seizure [facility])     Patient Active Problem List   Diagnosis Date Noted  . Mass of lower outer quadrant of left breast 11/25/2017  . RLQ abdominal pain 11/25/2017  . Missed period 11/25/2017  . History of ovarian cyst 11/25/2017  . Breast abscess 03/16/2017  . Pregnancy examination or test, negative result 03/16/2017  . Encounter for gynecological examination with Papanicolaou smear of cervix 03/16/2017  . Patient desires pregnancy 03/16/2017  . Soreness breast 12/02/2016  . Nausea vomiting and diarrhea 11/05/2016  . Depression 11/05/2016  . Fatigue 11/05/2016  . Missed periods 11/05/2016  . Lower abdominal pain 11/05/2016  . Intractable vomiting with nausea 10/10/2016  . Appendicitis 01/13/2014  . Severe sepsis ([place]) 01/13/2014  . Intra-abdominal abscess ([place]) 01/13/2014    Past Surgical History:  Procedure Laterality Date  . APPENDECTOMY    . CESAREAN SECTION    . DRAINAGE TUBE REMOVAL ([facility] HX)       OB History    Gravida  4   Para  1   Term  1   Preterm      AB  3   Living  1     SAB  3   TAB      Ectopic      Multiple      Live Births  1            Home Medications    Prior to Admission medications   Medication Sig Start  Date End Date Taking? Authorizing Provider  albuterol (PROAIR HFA) 108 (90 Base) MCG/ACT inhaler Inhale 2 puffs into the lungs every 4 (four) hours as needed for wheezing or shortness of breath (or coughing).    Yes [provider]  amoxicillin-clavulanate (AUGMENTIN) 875-125 MG tablet Take 1 tablet by mouth 2 (two) times daily. For 7 days   Yes [provider]  ondansetron (ZOFRAN) 8 MG tablet TAKE 1 TABLET BY MOUTH EVERY 8 HOURS AS NEEDED FOR NAUSEA AND VOMITING Patient taking differently: Take 8 mg by mouth every 8 (eight) hours as needed for nausea or vomiting.  11/23/17  Yes Estill Dooms, NP  zonisamide (ZONEGRAN) 100 MG capsule Take 300 mg by mouth 3 (three) times daily.    Yes [provider]  escitalopram (LEXAPRO) 20 MG tablet Take 1 tablet (20 mg total) by mouth daily. Patient not taking: Reported on 04/09/2018 11/05/16   Estill Dooms, NP  HYDROcodone-acetaminophen (NORCO/VICODIN) 5-325 MG tablet Take 1-2 tablets by mouth every 6 (six) hours as needed. Patient not taking: Reported on 04/09/2018 01/26/18  Montine Circle, PA-C  ibuprofen (ADVIL,MOTRIN) 600 MG tablet Take 1 tablet (600 mg total) by mouth every 6 (six) hours as needed. 04/09/18   Isla Pence, MD  medroxyPROGESTERone (PROVERA) 10 MG tablet Take 1 tablet (10 mg total) by mouth daily. 04/09/18   Isla Pence, MD  promethazine (PHENERGAN) 25 MG tablet Take 1 tablet (25 mg total) by mouth every 6 (six) hours as needed for nausea or vomiting. Patient not taking: Reported on 04/09/2018 08/11/17   Estill Dooms, NP    Family History Family History  Adopted: Yes  Family history unknown: Yes    Social History Social History   Tobacco Use  . Smoking status: Former Smoker    Packs/day: 0.50    Years: 5.00    Pack years: 2.50    Types: Cigarettes    Last attempt to quit: 04/14/2012    Years since quitting: 5.9  . Smokeless tobacco: Never Used  Substance Use Topics  .  Alcohol use: No  . Drug use: No     Allergies   Patient has no known allergies.   Review of Systems Review of Systems  Genitourinary:       Blood in urine  All other systems reviewed and are negative.    Physical Exam Updated Vital Signs BP 119/79 (BP Location: Right Arm)   Pulse 84   Temp 98 F (36.7 C) (Oral)   Resp 20   SpO2 100%   Physical Exam Vitals signs and nursing note reviewed.  Constitutional:      Appearance: Normal appearance. She is normal weight.  HENT:     Head: Normocephalic and atraumatic.     Right Ear: External ear normal.     Left Ear: External ear normal.     Nose: Nose normal.     Mouth/Throat:     Mouth: Mucous membranes are moist.     Pharynx: Oropharynx is clear.  Eyes:     Extraocular Movements: Extraocular movements intact.     Conjunctiva/sclera: Conjunctivae normal.     Pupils: Pupils are equal, round, and reactive to light.  Neck:     Musculoskeletal: Normal range of motion.  Cardiovascular:     Rate and Rhythm: Normal rate and regular rhythm.  Pulmonary:     Effort: Pulmonary effort is normal.     Breath sounds: Normal breath sounds.  Abdominal:     General: Abdomen is flat. Bowel sounds are normal.     Palpations: Abdomen is soft.  Genitourinary:    Cervix: Cervical bleeding present.     Uterus: Tender.   Musculoskeletal: Normal range of motion.  Skin:    General: Skin is warm.     Capillary Refill: Capillary refill takes less than 2 seconds.  Neurological:     General: No focal deficit present.     Mental Status: She is alert and oriented to person, place, and time.  Psychiatric:        Mood and Affect: Mood normal.        Behavior: Behavior normal.      ED Treatments / Results  Labs (all labs ordered are listed, but only abnormal results are displayed) Labs Reviewed  WET PREP, GENITAL - Abnormal; Notable for the following components:      Result Value   WBC, Wet Prep HPF POC MANY (*)    All other components  within normal limits  URINALYSIS, ROUTINE W REFLEX MICROSCOPIC - Abnormal; Notable for the following components:   Color,  Urine RED (*)    APPearance TURBID (*)    Glucose, UA   (*)    Value: TEST NOT REPORTED DUE TO COLOR INTERFERENCE OF URINE PIGMENT   Hgb urine dipstick   (*)    Value: TEST NOT REPORTED DUE TO COLOR INTERFERENCE OF URINE PIGMENT   Bilirubin Urine   (*)    Value: TEST NOT REPORTED DUE TO COLOR INTERFERENCE OF URINE PIGMENT   Ketones, ur   (*)    Value: TEST NOT REPORTED DUE TO COLOR INTERFERENCE OF URINE PIGMENT   Protein, ur   (*)    Value: TEST NOT REPORTED DUE TO COLOR INTERFERENCE OF URINE PIGMENT   Nitrite   (*)    Value: TEST NOT REPORTED DUE TO COLOR INTERFERENCE OF URINE PIGMENT   Leukocytes, UA   (*)    Value: TEST NOT REPORTED DUE TO COLOR INTERFERENCE OF URINE PIGMENT   All other components within normal limits  URINALYSIS, MICROSCOPIC (REFLEX) - Abnormal; Notable for the following components:   Bacteria, UA RARE (*)    All other components within normal limits  CBC WITH DIFFERENTIAL/PLATELET  COMPREHENSIVE METABOLIC PANEL  I-STAT BETA HCG BLOOD, ED ([facility], [facility], [facility] ONLY)  TYPE AND SCREEN  ABO/RH  GC/CHLAMYDIA PROBE AMP ([place]) NOT AT [facility]    EKG None  Radiology No results found.  Procedures Procedures (including critical care time)  Medications Ordered in ED Medications  sodium chloride 0.9 % bolus 1,000 mL (has no administration in time range)  ondansetron (ZOFRAN) injection 4 mg (has no administration in time range)     Initial Impression / Assessment and Plan / ED Course  I have reviewed the triage vital signs and the nursing notes.  Pertinent labs & imaging results that were available during my care of the patient were reviewed by me and considered in my medical decision making (see chart for details).    Blood coming from vagina.  She likely has DUB.  She said her obgyn in [place] is too far for her to drive as she lives in  [place] now.  She will be given the number of the [facility] and started on provera.  Return if worse.  Final Clinical Impressions(s) / ED Diagnoses   Final diagnoses:  DUB (dysfunctional uterine bleeding)    ED Discharge Orders         Ordered    medroxyPROGESTERone (PROVERA) 10 MG tablet  Daily     04/09/18 1743    ibuprofen (ADVIL,MOTRIN) 600 MG tablet  Every 6 hours PRN     04/09/18 1743           Isla Pence, MD 04/09/18 1744

## 2018-04-09 NOTE — ED Triage Notes (Signed)
Pt in c/o abnormal vaginal bleeding and also blood in her urine, pt reports this has been going on for the last few weeks, also feeling nausea and feels lightheaded

## 2018-04-12 LAB — GC/CHLAMYDIA PROBE AMP (~~LOC~~) NOT AT ARMC
Chlamydia: NEGATIVE
NEISSERIA GONORRHEA: NEGATIVE

## 2018-06-08 ENCOUNTER — Encounter: Payer: Self-pay | Admitting: Obstetrics and Gynecology

## 2018-07-01 ENCOUNTER — Encounter: Payer: Self-pay | Admitting: *Deleted

## 2018-07-01 ENCOUNTER — Telehealth: Payer: Self-pay | Admitting: Obstetrics & Gynecology

## 2018-07-01 NOTE — Telephone Encounter (Signed)
The patient called in to verify if our clinic is  Currently seeing patients. Educated yes, also informed the patient of the virus restrictions.

## 2018-07-08 ENCOUNTER — Telehealth: Payer: Self-pay | Admitting: Obstetrics and Gynecology

## 2018-07-08 ENCOUNTER — Ambulatory Visit (INDEPENDENT_AMBULATORY_CARE_PROVIDER_SITE_OTHER): Payer: Medicaid Other | Admitting: Obstetrics and Gynecology

## 2018-07-08 ENCOUNTER — Other Ambulatory Visit: Payer: Self-pay

## 2018-07-08 ENCOUNTER — Encounter: Payer: Self-pay | Admitting: Obstetrics and Gynecology

## 2018-07-08 VITALS — Ht 64.5 in

## 2018-07-08 DIAGNOSIS — N898 Other specified noninflammatory disorders of vagina: Secondary | ICD-10-CM | POA: Diagnosis not present

## 2018-07-08 DIAGNOSIS — N912 Amenorrhea, unspecified: Secondary | ICD-10-CM | POA: Diagnosis not present

## 2018-07-08 NOTE — Progress Notes (Signed)
   TELEHEALTH VIRTUAL GYNECOLOGY VISIT ENCOUNTER NOTE  I connected with Jamie [name] on 07/08/18 at  1:55 PM EDT by telephone at home and verified that I am speaking with the correct person using two identifiers.   I discussed the limitations, risks, security and privacy concerns of performing an evaluation and management service by telephone and the availability of in person appointments. I also discussed with the patient that there may be a patient responsible charge related to this service. The patient expressed understanding and agreed to proceed.   History:  Jamie [name] is a 28 y.o. [phone] female being evaluated today for lack of period. Has also had milky vaginal discharge that started a couple days before period was supposed to start. Does not itch or burn. Has never had this before. Not concerned about STIs. She was started on Provera at the ED at the end of January and her periods have been regular after that until now, when her period is late.   LMP 06/06/18 [place] 06/15/18  Referred by [facility] for irregular menses. Has had lab work and US done there, results not available.    Past Medical History:  Diagnosis Date  . Anemia   . Asthma   . Mental disorder    depression  . Seizure [facility])    Past Surgical History:  Procedure Laterality Date  . APPENDECTOMY    . CESAREAN SECTION    . DRAINAGE TUBE REMOVAL ([place] HX)     The following portions of the patient's history were reviewed and updated as appropriate: allergies, current medications, past family history, past medical history, past social history, past surgical history and problem list.   Health Maintenance:  Normal pap on 03/2017.    Review of Systems:  Pertinent items noted in HPI and remainder of comprehensive ROS otherwise negative.  Physical Exam:  Physical exam deferred due to nature of the encounter  Labs and Imaging No results found for this or any previous visit (from the past 336 hour(s)). No  results found.    Assessment and Plan:     1. Amenorrhea - Recommended she take at home pregnancy test, she will call with results - will review lab results from [facility] to see if obvious cause for irregular periods  2. Vaginal discharge No itch or burn     I discussed the assessment and treatment plan with the patient. The patient was provided an opportunity to ask questions and all were answered. The patient agreed with the plan and demonstrated an understanding of the instructions.   The patient was advised to call back or seek an in-person evaluation/go to the ED if the symptoms worsen or if the condition fails to improve as anticipated.  I provided 11 minutes of non-face-to-face time during this encounter.   Sloan Leiter, MD [facility] [place], [place]

## 2018-07-08 NOTE — Telephone Encounter (Signed)
Called the patient to inform of telephone visit and expect a call due to the [place] restrictions.

## 2018-07-12 ENCOUNTER — Telehealth: Payer: Self-pay | Admitting: Obstetrics and Gynecology

## 2018-07-12 ENCOUNTER — Telehealth: Payer: Self-pay | Admitting: Family Medicine

## 2018-07-12 NOTE — Telephone Encounter (Signed)
Patient called to say she wanted to speak with a nurse about some issues she was having.

## 2018-07-12 NOTE — Telephone Encounter (Signed)
Called pt regarding her c/o heavy dark red bleeding which she expressed to Alla German in phone call earlier today. She stated that she is having a period which began last night. She has been wearing a super tampon and 2 pads since the bleeding began. She reports having to change these every 10 minutes or so. She is also having abdominal pain which has become worse since last night. She has taken ibuprofen 400 mg which did not help the pain. Pt states she had recent US which showed many ovarian cysts and I reviewed the results as we spoke. Pt sounded as though she was crying and very uncomfortable during the conversation. I advised her to go to [facility] for [facility]. She voiced understanding and agreed.

## 2018-07-14 ENCOUNTER — Telehealth: Payer: Self-pay | Admitting: Obstetrics and Gynecology

## 2018-07-14 NOTE — Telephone Encounter (Signed)
The patient called in stating she was told to schedule an appointment to come in to the office. Informed the patient of the COVID19 restrictions. Informed the patient she will also be placed on the wait list in May. Urged the patient to visit the ED per Nurse Vaughan Basta and previous phone notes (Diane).

## 2018-09-16 ENCOUNTER — Ambulatory Visit: Payer: Medicaid Other | Attending: Nurse Practitioner | Admitting: Physical Therapy

## 2018-11-01 ENCOUNTER — Telehealth: Payer: Self-pay | Admitting: Obstetrics and Gynecology

## 2018-11-01 NOTE — Telephone Encounter (Signed)
Called the patient in regards to the upcoming scheduled appointment. Informed of the letter being sent and also informed of the mychart visit.

## 2018-11-03 ENCOUNTER — Other Ambulatory Visit: Payer: Self-pay

## 2018-11-03 ENCOUNTER — Ambulatory Visit: Payer: Medicaid Other | Attending: Nurse Practitioner | Admitting: Physical Therapy

## 2018-11-03 ENCOUNTER — Encounter: Payer: Self-pay | Admitting: Physical Therapy

## 2018-11-03 DIAGNOSIS — M545 Low back pain, unspecified: Secondary | ICD-10-CM

## 2018-11-03 DIAGNOSIS — R293 Abnormal posture: Secondary | ICD-10-CM | POA: Insufficient documentation

## 2018-11-03 DIAGNOSIS — M546 Pain in thoracic spine: Secondary | ICD-10-CM | POA: Insufficient documentation

## 2018-11-03 DIAGNOSIS — G8929 Other chronic pain: Secondary | ICD-10-CM

## 2018-11-03 NOTE — Therapy (Signed)
[place], [place], [address] Phone: [phone]   Fax:  [phone]  Physical Therapy Evaluation  Patient Details  Name: Jamie [name] MRN: [id number] Date of Birth: 12-21-1990 Referring Provider (PT): Simona Huh  NP    Encounter Date: 11/03/2018  PT End of Session - 11/03/18 1248    Visit Number  1    Number of Visits  4    Date for PT Re-Evaluation  12/01/18    Authorization Type  [employer]    PT Start Time  1045    PT Stop Time  1128    PT Time Calculation (min)  43 min    Activity Tolerance  Patient tolerated treatment well    Behavior During Therapy  [facility] for tasks assessed/performed       Past Medical History:  Diagnosis Date  . Anemia   . Asthma   . Mental disorder    depression  . Seizure [facility])     Past Surgical History:  Procedure Laterality Date  . APPENDECTOMY    . CESAREAN SECTION    . DRAINAGE TUBE REMOVAL ([place] [facility])      There were no vitals filed for this visit.   Subjective Assessment - 11/03/18 1053    Subjective  Patient reports a 3 year history of lower back pain. She reports she was disgnosed with scoliosis. She has pain with any prolonged positioning.,    Limitations  Standing;Walking    Patient Stated Goals  less pain, to continue to be able to work    Currently in Pain?  Yes    Pain Score  8     Pain Location  Back    Pain Orientation  Upper;Lower    Pain Descriptors / Indicators  Burning    Pain Type  Chronic pain    Pain Radiating Towards  into her whole lower back    Pain Onset  More than a month ago    Pain Frequency  Constant    Aggravating Factors   any kind of prolonged activity    Pain Relieving Factors  rest    Effect of Pain on Daily Activities  difficulty perfroming ADL's    Multiple Pain Sites  Yes         [facility] PT Assessment - 11/03/18 0001      Assessment   Medical Diagnosis  Lower and Upper Back pain     Referring Provider (PT)   Simona Huh  NP     Onset Date/Surgical Date  --   3 years prior    Hand Dominance  Right    Next MD Visit  None scheduled     Prior Therapy  None       Precautions   Precaution Comments  Seizures       Restrictions   Weight Bearing Restrictions  No      Balance Screen   Has the patient fallen in the past 6 months  Yes   when she hads seizures    How many times?  All the time       Home Environment   Additional Comments  nothing significant       Prior Function   Level of Independence  Independent    Vocation  Full time employment    Lawyer     Leisure  trys to walk       Cognition   Overall Cognitive Status  Within  Functional Limits for tasks assessed      Observation/Other Assessments   Observations  Patient was on her cell phone through the entire appointment; superior curve to the right inferior curve to the left       Sensation   Additional Comments  Patientds feet are numb at this time      Coordination   Gross Motor Movements are Fluid and Coordinated  Yes    Fine Motor Movements are Fluid and Coordinated  Yes      Posture/Postural Control   Posture Comments  sits with posterior pelvic tilt with rounded shoulders. Stads with slumped shoulders       ROM / Strength   AROM / PROM / Strength  AROM;PROM;Strength      AROM   AROM Assessment Site  Lumbar    Lumbar Flexion  limited 25% with pain     Lumbar Extension  No limit with pain     Lumbar - Right Rotation  no limit with pain     Lumbar - Left Rotation  no limit with pain, more pain on the left       PROM   Overall PROM Comments  pain with passive hip flexion to 90 degrees bilaterl, pain with internal rotation of bilateral LE       Strength   Strength Assessment Site  Hip;Knee    Right/Left Hip  Right;Left    Right Hip Flexion  4/5    Right Hip ABduction  4/5    Left Hip Flexion  4/5    Left Hip ABduction  4/5    Right/Left Knee  Right;Left    Right Knee Flexion  5/5     Right Knee Extension  5/5    Left Knee Flexion  5/5    Left Knee Extension  5/5      Palpation   Palpation comment  spasming in bilateral upper traps and into scapualr area,       Ambulation/Gait   Gait Comments  decreased hip flexion with ambualtion, increased hip abduction, decreased hip rotation                 Objective measurements completed on examination: See above findings.      [facility] Adult PT Treatment/Exercise - 11/03/18 0001      Exercises   Exercises  Lumbar      Lumbar Exercises: Standing   Other Standing Lumbar Exercises  trigger point release to upper and lower back       Lumbar Exercises: Seated   Other Seated Lumbar Exercises  seated posture with breathing in pain free range       Lumbar Exercises: Supine   AB Set Limitations  abdominal breathing, mod cuing required              PT Education - 11/03/18 1247    Education Details  hep , symptom mangement, lifting technique ( patient did not look up from her phone)    Person(s) Educated  Patient    Methods  Handout;Explanation;Demonstration    Comprehension  Need further instruction       PT Short Term Goals - 11/03/18 1420      PT SHORT TERM GOAL #1   Title  Patient will  be independent with basic HEP    Baseline  does not have any type of exercises she is performing    Time  4    Period  Weeks    Status  New  Target Date  11/24/18      PT SHORT TERM GOAL #2   Title  Patient will increase gross bilateral hip flexion strength to 4+/5    Baseline  4/5 bilateral    Time  4    Period  Weeks    Status  New    Target Date  12/01/18      PT SHORT TERM GOAL #3   Title  Patient will report 5/10 pain with at worst    Baseline  pain consitently at 8/10    Time  4    Period  Weeks    Status  New    Target Date  12/01/18                Plan - 11/03/18 1251    Clinical Impression Statement  Patient is a 28 year old female with lower and upper back pain. Her diagnosis  is consitent with diagnosis of scoliosis. She has spasming from her cervical spine down to her lumbar spine throughout her paraspiansls She sits slumped in a posterior pelvic tilt. she reports when she sits straight her numbness and tingling increase. Therapy educated on proper posture. She has bilatera LE weakness. Her lumbar motion is slightly limited with pain at end range. 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 Therapy attmepted to review lifting technique with her but she was on her phone. She would benefit from a cervical and lumbar stretching program as well as postural correction, and a core stability program . She was seen for a moderate complexity eval.    Personal Factors and Comorbidities  Comorbidity 1    Comorbidities  seizures that cuase her to fall frequently, mild obesity, depression    Examination-Activity Limitations  Bed Mobility;Bend;Carry;Locomotion Level;Lift;Stairs    Examination-Participation Restrictions  Community Activity;Yard Work;Other    Stability/Clinical Decision Making  Evolving/Moderate complexity    Clinical Decision Making  Moderate    Rehab Potential  Good    PT Frequency  2x / week    PT Duration  8 weeks    PT Treatment/Interventions  ADLs/Self Care Home Management;Ultrasound;Moist Heat;Iontophoresis 4mg /ml Dexamethasone;Electrical Stimulation;Gait training;Stair training;Functional mobility training;Therapeutic activities;Therapeutic exercise;Patient/family education;Manual techniques;Passive range of motion;Dry needling;Taping    PT Next Visit Plan  manual therapy to cervical spine and lower back, work on lifting technique, begin core stregthening, review current HEP       Patient will benefit from skilled therapeutic intervention in order to improve the following deficits and impairments:  Increased muscle spasms, Decreased activity tolerance, Decreased endurance, Decreased strength, Decreased safety awareness, Decreased range of motion, Difficulty  walking, Decreased mobility, Postural dysfunction, Improper body mechanics  Visit Diagnosis: 1. Chronic bilateral low back pain without sciatica   2. Pain in thoracic spine   3. Abnormal posture        Problem List Patient Active Problem List   Diagnosis Date Noted  . Mass of lower outer quadrant of left breast 11/25/2017  . RLQ abdominal pain 11/25/2017  . Missed period 11/25/2017  . History of ovarian cyst 11/25/2017  . Breast abscess 03/16/2017  . Pregnancy examination or test, negative result 03/16/2017  . Encounter for gynecological examination with Papanicolaou smear of cervix 03/16/2017  . Patient desires pregnancy 03/16/2017  . Soreness breast 12/02/2016  . Nausea vomiting and diarrhea 11/05/2016  . Depression 11/05/2016  . Fatigue 11/05/2016  . Missed periods 11/05/2016  . Lower abdominal pain 11/05/2016  . Intractable vomiting with nausea 10/10/2016  . Appendicitis  01/13/2014  . Severe sepsis ([place]) 01/13/2014  . Intra-abdominal abscess ([place]) 01/13/2014    Carney Living 11/03/2018, 2:31 PM  [facility] [address] [place], [place], [address] Phone: [phone]   Fax:  [phone]  Name: Jamie [name] MRN: [id number] Date of Birth: May 28, 1990

## 2018-11-12 ENCOUNTER — Ambulatory Visit: Payer: Medicaid Other | Admitting: Physical Therapy

## 2018-11-12 ENCOUNTER — Telehealth: Payer: Self-pay | Admitting: Physical Therapy

## 2018-11-12 ENCOUNTER — Encounter: Payer: Self-pay | Admitting: *Deleted

## 2018-11-12 NOTE — Telephone Encounter (Signed)
Patient contacted regarding no-show appointment. The patient reports something came up. She was advised if something comes up to please call and let us know. She was reminded of our attendance policy and her next visit.

## 2018-11-16 ENCOUNTER — Other Ambulatory Visit: Payer: Self-pay

## 2018-11-16 ENCOUNTER — Encounter: Payer: Self-pay | Admitting: Obstetrics and Gynecology

## 2018-11-16 ENCOUNTER — Telehealth (INDEPENDENT_AMBULATORY_CARE_PROVIDER_SITE_OTHER): Payer: Medicaid Other | Admitting: Obstetrics and Gynecology

## 2018-11-16 DIAGNOSIS — N926 Irregular menstruation, unspecified: Secondary | ICD-10-CM | POA: Diagnosis not present

## 2018-11-16 DIAGNOSIS — Z319 Encounter for procreative management, unspecified: Secondary | ICD-10-CM | POA: Diagnosis not present

## 2018-11-16 NOTE — Progress Notes (Signed)
   TELEHEALTH VIRTUAL GYNECOLOGY VISIT ENCOUNTER NOTE  I connected with Jamie [name] on 11/16/18 at  8:55 AM EDT by telephone at home and verified that I am speaking with the correct person using two identifiers.   I discussed the limitations, risks, security and privacy concerns of performing an evaluation and management service by telephone and the availability of in person appointments. I also discussed with the patient that there may be a patient responsible charge related to this service. The patient expressed understanding and agreed to proceed.   History:  Jamie [name] is a 28 y.o. [phone] female being evaluated today for menstrual irregularity. She is concerned about lack of period. Had monthly periods in May/June, lasting 7 days, using 4-5 pads/tampons. LMP 10/04/18. Did not have period in July. No cramping or anything like that. She is sexually active with men, no use of contraception.  She denies any abnormal vaginal discharge, bleeding, pelvic pain or other concerns.  Has also been feeling extremely tired recently, noticed it started about the time when her period did not come one.     Past Medical History:  Diagnosis Date  . Anemia   . Asthma   . Mental disorder    depression  . Seizure [facility])    Past Surgical History:  Procedure Laterality Date  . APPENDECTOMY    . CESAREAN SECTION    . DRAINAGE TUBE REMOVAL ([place] [facility]X)     The following portions of the patient's history were reviewed and updated as appropriate: allergies, current medications, past family history, past medical history, past social history, past surgical history and problem list.   Health Maintenance: Per [facility] records, it was done 03/14/18, patient reports she was told it was normal.  Review of Systems:  Pertinent items noted in HPI and remainder of comprehensive ROS otherwise negative.  Physical Exam:   General:  Alert, oriented and cooperative.   Mental Status: Normal mood and affect  perceived. Normal judgment and thought content.  Physical exam deferred due to nature of the encounter  Labs and Imaging No results found for this or any previous visit (from the past 336 hour(s)). No results found.    Assessment and Plan:   1. Missed period Advised she take a pregnancy test at home, let us know results - have requested results of labwork done at [place] as she reports she had labs done there in the spring, will bring patient in for lab work visit based on what was done/not done at [facility]  2. Patient desires pregnancy Patient reports she has been having unprotected intercourse for 2 years and attempting pregnancy, reviewed she should be referred to [facility] but will get some labs done first for occasional irregular periods    I discussed the assessment and treatment plan with the patient. The patient was provided an opportunity to ask questions and all were answered. The patient agreed with the plan and demonstrated an understanding of the instructions.   The patient was advised to call back or seek an in-person evaluation/go to the ED if the symptoms worsen or if the condition fails to improve as anticipated.  I provided 15 minutes of non-face-to-face time during this encounter.   Sloan Leiter, MD [facility] [place], [place]

## 2018-11-16 NOTE — Progress Notes (Signed)
I connected with  Jamie [name] on 11/16/18 at  8:55 AM EDT by telephone and verified that I am speaking with the correct person using two identifiers.   I discussed the limitations, risks, security and privacy concerns of performing an evaluation and management service by telephone and the availability of in person appointments. I also discussed with the patient that there may be a patient responsible charge related to this service. The patient expressed understanding and agreed to proceed.    Verdell Carmine, RN 11/16/2018  9:05 AM

## 2018-11-19 ENCOUNTER — Ambulatory Visit: Payer: Medicaid Other | Admitting: Physical Therapy

## 2018-11-23 ENCOUNTER — Ambulatory Visit: Payer: Medicaid Other | Attending: Nurse Practitioner | Admitting: Physical Therapy

## 2018-12-11 ENCOUNTER — Encounter (HOSPITAL_COMMUNITY): Payer: Self-pay | Admitting: Emergency Medicine

## 2018-12-11 ENCOUNTER — Ambulatory Visit (HOSPITAL_COMMUNITY)
Admission: EM | Admit: 2018-12-11 | Discharge: 2018-12-11 | Disposition: A | Discharge status: Home / Self Care | Payer: Medicaid Other | Attending: Urgent Care | Admitting: Urgent Care

## 2018-12-11 ENCOUNTER — Other Ambulatory Visit: Payer: Self-pay

## 2018-12-11 ENCOUNTER — Ambulatory Visit (INDEPENDENT_AMBULATORY_CARE_PROVIDER_SITE_OTHER): Payer: Medicaid Other

## 2018-12-11 DIAGNOSIS — F17200 Nicotine dependence, unspecified, uncomplicated: Secondary | ICD-10-CM

## 2018-12-11 DIAGNOSIS — R0789 Other chest pain: Secondary | ICD-10-CM

## 2018-12-11 DIAGNOSIS — R05 Cough: Secondary | ICD-10-CM | POA: Diagnosis not present

## 2018-12-11 DIAGNOSIS — M940 Chondrocostal junction syndrome [Tietze]: Secondary | ICD-10-CM

## 2018-12-11 DIAGNOSIS — R059 Cough, unspecified: Secondary | ICD-10-CM

## 2018-12-11 DIAGNOSIS — Z3202 Encounter for pregnancy test, result negative: Secondary | ICD-10-CM | POA: Diagnosis not present

## 2018-12-11 LAB — POCT PREGNANCY, URINE: Preg Test, Ur: NEGATIVE

## 2018-12-11 MED ORDER — CYCLOBENZAPRINE HCL 5 MG PO TABS: 5.0000 mg | ORAL_TABLET | Freq: Every evening | ORAL | 0 refills | Status: DC | PRN

## 2018-12-11 MED ORDER — PREDNISONE 20 MG PO TABS: ORAL_TABLET | ORAL | 0 refills | Status: DC

## 2018-12-11 NOTE — ED Provider Notes (Signed)
MRN: [id number] DOB: [birth year]  Subjective:   Jamie [name] is a 28 y.o. female presenting for 3 week history of persistent daily intermittent sharp upper chest pain and occurs randomly.  Patient reports a history of severe pneumonia, per chart review this was in 01/13/2014.  She is very concerned about this given that she was told she had severe pneumonia and fluid in her lungs. 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 Smokes 1 pack every other 2-3 days.  States that she also has a dry cough and gets intermittently short of breath.  Has not tried any medications for this though.  She does have a toddler, has to take care of him and states that lifting him is difficult, weighs about 50 pounds.   No current facility-administered medications for this encounter.   Current Outpatient Medications:  .  albuterol (PROAIR HFA) 108 (90 Base) MCG/ACT inhaler, Inhale 2 puffs into the lungs every 4 (four) hours as needed for wheezing or shortness of breath (or coughing). , Disp: , Rfl:  .  FLUoxetine (PROZAC) 20 MG tablet, Take 20 mg by mouth daily., Disp: , Rfl:  .  meloxicam (MOBIC) 15 MG tablet, Take 15 mg by mouth daily., Disp: , Rfl:  .  zonisamide (ZONEGRAN) 100 MG capsule, Take 300 mg by mouth 3 (three) times daily. , Disp: , Rfl:     No Known Allergies   Past Medical History:  Diagnosis Date  . Anemia   . Asthma   . Mental disorder    depression  . Seizure [facility])      Past Surgical History:  Procedure Laterality Date  . APPENDECTOMY    . CESAREAN SECTION    . DRAINAGE TUBE REMOVAL ([place] [facility]X)      Review of Systems  Constitutional: Negative for fever and malaise/fatigue.  HENT: Negative for congestion, ear pain, sinus pain and sore throat.   Eyes: Negative for blurred vision, double vision, discharge and redness.  Respiratory: Positive for cough and shortness of breath. Negative for hemoptysis and wheezing.   Cardiovascular: Positive for chest pain.  Gastrointestinal: Negative  for abdominal pain, diarrhea, nausea and vomiting.  Genitourinary: Negative for dysuria, flank pain and hematuria.  Musculoskeletal: Negative for myalgias.  Skin: Negative for rash.  Neurological: Negative for dizziness, weakness and headaches.  Psychiatric/Behavioral: Negative for depression and substance abuse.    Family History  Adopted: Yes  Family history unknown: Yes    Objective:   Vitals: BP 117/65 (BP Location: Left Arm)   Pulse 87   Temp 98.7 F (37.1 C) (Oral)   Resp 18   LMP 11/14/2018 (Approximate)   SpO2 100%   Physical Exam Constitutional:      General: She is not in acute distress.    Appearance: Normal appearance. She is well-developed. She is not ill-appearing, toxic-appearing or diaphoretic.  HENT:     Head: Normocephalic and atraumatic.     Right Ear: Tympanic membrane and ear canal normal. No drainage or tenderness. No middle ear effusion. Tympanic membrane is not erythematous.     Left Ear: Tympanic membrane and ear canal normal. No drainage or tenderness.  No middle ear effusion. Tympanic membrane is not erythematous.     Nose: Nose normal. No congestion or rhinorrhea.     Mouth/Throat:     Mouth: Mucous membranes are moist. No oral lesions.     Pharynx: Oropharynx is clear. No pharyngeal swelling, oropharyngeal exudate, posterior oropharyngeal erythema or uvula swelling.  Tonsils: No tonsillar exudate or tonsillar abscesses.  Eyes:     Extraocular Movements: Extraocular movements intact.     Right eye: Normal extraocular motion.     Left eye: Normal extraocular motion.     Conjunctiva/sclera: Conjunctivae normal.     Pupils: Pupils are equal, round, and reactive to light.  Neck:     Musculoskeletal: Normal range of motion and neck supple.  Cardiovascular:     Rate and Rhythm: Normal rate and regular rhythm.     Pulses: Normal pulses.     Heart sounds: Normal heart sounds. No murmur. No friction rub. No gallop.   Pulmonary:     Effort:  Pulmonary effort is normal. No respiratory distress.     Breath sounds: Normal breath sounds. No stridor. No wheezing, rhonchi or rales.  Abdominal:     General: Bowel sounds are normal. There is no distension.     Palpations: Abdomen is soft. There is no mass.     Tenderness: There is no abdominal tenderness. There is no guarding or rebound.  Lymphadenopathy:     Cervical: No cervical adenopathy.  Skin:    General: Skin is warm and dry.     Findings: No rash.  Neurological:     General: No focal deficit present.     Mental Status: She is alert and oriented to person, place, and time.  Psychiatric:        Mood and Affect: Mood normal.        Behavior: Behavior normal.        Thought Content: Thought content normal.        Judgment: Judgment normal.     Results for orders placed or performed during the hospital encounter of 12/11/18 (from the past 24 hour(s))  Pregnancy, urine POC     Status: None   Collection Time: 12/11/18  1:49 PM  Result Value Ref Range   Preg Test, Ur NEGATIVE NEGATIVE   Dg Chest 2 View  Result Date: 12/11/2018 CLINICAL DATA:  Chest pain, history of pneumonia EXAM: CHEST - 2 VIEW COMPARISON:  12/25/2017 FINDINGS: The heart size and mediastinal contours are within normal limits. Both lungs are clear. The visualized skeletal structures are unremarkable. IMPRESSION: No acute abnormality of the lungs. Electronically Signed   By: Eddie Candle M.D.   On: 12/11/2018 14:16    Assessment and Plan :    1. Costochondritis   2. Atypical chest pain   3. Cough   4. Tobacco use disorder     Physical exam findings and chest x-ray reassuring.  Counseled patient that we will manage this as a costochondritis of unknown etiology.  Recommended patient start prednisone course, use muscle relaxant.  Advised that she get help if she is able to from a family member with taking care of her baby to see if this does not let up some of her chest pain.  Encouraged patient to consider  smoking cessation.  Counseled patient on potential for adverse effects with medications prescribed/recommended today, ER and return-to-clinic precautions discussed, patient verbalized understanding.    Jaynee Eagles, PA-C 12/11/18 1500

## 2018-12-11 NOTE — ED Triage Notes (Signed)
Pt sts CP worse with movement; pt sts similar to when had PNA in past; denies cough

## 2018-12-11 NOTE — Discharge Instructions (Signed)
Do not take any other NSAID including meloxicam (Mobic), diclofenac, ibuprofen, naproxen, Aleve, Motrin, etc while taking prednisone.

## 2018-12-14 ENCOUNTER — Other Ambulatory Visit: Payer: Self-pay | Admitting: Obstetrics and Gynecology

## 2018-12-14 ENCOUNTER — Other Ambulatory Visit: Payer: Medicaid Other

## 2018-12-14 DIAGNOSIS — N926 Irregular menstruation, unspecified: Secondary | ICD-10-CM

## 2018-12-22 ENCOUNTER — Other Ambulatory Visit: Payer: Medicaid Other

## 2018-12-24 ENCOUNTER — Other Ambulatory Visit: Payer: Medicaid Other

## 2018-12-30 ENCOUNTER — Other Ambulatory Visit: Payer: Medicaid Other

## 2019-02-04 ENCOUNTER — Other Ambulatory Visit: Payer: Medicaid Other
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
## 2019-02-22 ENCOUNTER — Other Ambulatory Visit: Payer: Self-pay

## 2019-02-22 ENCOUNTER — Ambulatory Visit (INDEPENDENT_AMBULATORY_CARE_PROVIDER_SITE_OTHER): Payer: Medicaid Other | Admitting: General Practice

## 2019-02-22 DIAGNOSIS — Z3202 Encounter for pregnancy test, result negative: Secondary | ICD-10-CM

## 2019-02-22 LAB — POCT PREGNANCY, URINE: Preg Test, Ur: NEGATIVE

## 2019-02-22 NOTE — Progress Notes (Signed)
Patient presents to office today for UPT. UPT -. Patient reports no period since September. Discussed drawing labs today previously ordered by Dr Rosana Hoes. Patient is agreeable. Discussed Dr Rosana Hoes will reach out with results. Patient verbalized understanding & asked if we could refer her to infertility if need be. Told patient yes we would be able to. Patient verbalized understanding & had no questions.  Koren Bound RN BSN 02/22/19

## 2019-02-23 ENCOUNTER — Telehealth: Payer: Self-pay | Admitting: Obstetrics and Gynecology

## 2019-02-23 LAB — COMPREHENSIVE METABOLIC PANEL
ALT: 10 IU/L (ref 0–32)
AST: 19 IU/L (ref 0–40)
Albumin/Globulin Ratio: 1.3 (ref 1.2–2.2)
Albumin: 4 g/dL (ref 3.9–5.0)
Alkaline Phosphatase: 77 IU/L (ref 39–117)
BUN/Creatinine Ratio: 6 — ABNORMAL LOW (ref 9–23)
BUN: 5 mg/dL — ABNORMAL LOW (ref 6–20)
Bilirubin Total: <0.2 mg/dL (ref 0.0–1.2)
CO2: 21 mmol/L (ref 20–29)
Calcium: 9 mg/dL (ref 8.7–10.2)
Chloride: 106 mmol/L (ref 96–106)
Creatinine, Ser: 0.88 mg/dL (ref 0.57–1.00)
GFR calc Af Amer: 104 mL/min/{1.73_m2} (ref >59)
GFR calc non Af Amer: 90 mL/min/{1.73_m2} (ref >59)
Globulin, Total: 3 g/dL (ref 1.5–4.5)
Glucose: 91 mg/dL (ref 65–99)
Potassium: 3.9 mmol/L (ref 3.5–5.2)
Sodium: 140 mmol/L (ref 134–144)
Total Protein: 7 g/dL (ref 6.0–8.5)

## 2019-02-23 LAB — DHEA-SULFATE: DHEA-SO4: 115 ug/dL (ref 84.8–378.0)

## 2019-02-23 LAB — PROLACTIN: Prolactin: 14.3 ng/mL (ref 4.8–23.3)

## 2019-02-23 LAB — FOLLICLE STIMULATING HORMONE: FSH: 6.4 m[IU]/mL

## 2019-02-23 LAB — TESTOSTERONE: Testosterone: 27 ng/dL (ref 8–48)

## 2019-02-23 LAB — CBC
Hematocrit: 35.4 % (ref 34.0–46.6)
Hemoglobin: 11.6 g/dL (ref 11.1–15.9)
MCH: 26.3 pg — ABNORMAL LOW (ref 26.6–33.0)
MCHC: 32.8 g/dL (ref 31.5–35.7)
MCV: 80 fL (ref 79–97)
Platelets: 317 10*3/uL (ref 150–450)
RBC: 4.41 x10E6/uL (ref 3.77–5.28)
RDW: 13.3 % (ref 11.7–15.4)
WBC: 5.1 10*3/uL (ref 3.4–10.8)

## 2019-02-23 LAB — LUTEINIZING HORMONE: LH: 7 m[IU]/mL

## 2019-02-23 LAB — BETA HCG QUANT (REF LAB): hCG Quant: <1 m[IU]/mL

## 2019-02-23 LAB — ESTRADIOL: Estradiol: 78.3 pg/mL

## 2019-02-23 LAB — TSH: TSH: 2.14 u[IU]/mL (ref 0.450–4.500)

## 2019-02-23 NOTE — Progress Notes (Signed)
I have reviewed this chart and agree with the RN/CMA assessment and management.    K. Meryl Davis, M.D. Attending [facility]   

## 2019-02-23 NOTE — Telephone Encounter (Signed)
Patient called to speak with Dr Rosana Hoes. She then wanted to speak with the nurse. She is requesting a call back.

## 2019-02-24 ENCOUNTER — Telehealth: Payer: Self-pay | Admitting: Obstetrics and Gynecology

## 2019-02-24 DIAGNOSIS — N979 Female infertility, unspecified: Secondary | ICD-10-CM

## 2019-02-24 NOTE — Addendum Note (Signed)
Addended by: Samuel Germany on: 02/24/2019 02:54 PM   Modules accepted: Orders

## 2019-02-24 NOTE — Telephone Encounter (Signed)
I called [facility] [phone] and left message re : referral and asked for call back. Artist Bloom,RN

## 2019-02-24 NOTE — Telephone Encounter (Signed)
Received a call from [facility] this morning. She stated she needed to make an appointment to see Dr Rosana Hoes for a referral code. She wants to know if this visit can be via MyChart.

## 2019-02-24 NOTE — Telephone Encounter (Signed)
Please see other telephone call dated 02/24/19

## 2019-02-24 NOTE — Telephone Encounter (Signed)
I called Jamie [name] and she states Dr.Davis sent her a message and she does want a referral. Per chart review Dr.Davis informed her lab work was negative for common hormonal causes for infertility. States she would recommend a referral to infertility specialist. Jamie [name] would like [place] to proceed with that referral.  I gave her choices and she selected [facility] specialist. I informed her we will start the referral. She also reports heavy bleeding - saturating 2 pads and tampon less than an hour for several hours and some pelvic pain. I instructed her to go to hospital for evaluation. Jamie Zaugg,RN

## 2019-02-25 NOTE — Telephone Encounter (Signed)
Addendum from 02/24/19 pm. [facility] called back and instructed me to send a completed referral form with office notes and they will review and then contact patient with appointment and send [place] a notification of  Her appointment also.  Linda,RN

## 2019-02-25 NOTE — Telephone Encounter (Signed)
Referral form for [facility] completed and will be faxed today. I called Jadee and notified her I have contacted [facility] and am sending the referral form and they will contact her with appointment and information re: how much she must pay up front. She voices understanding.  Linda,RN

## 2019-03-28 ENCOUNTER — Emergency Department (HOSPITAL_COMMUNITY)
Admission: EM | Admit: 2019-03-28 | Discharge: 2019-03-28 | Disposition: A | Discharge status: Home / Self Care | Payer: Medicaid Other | Attending: Emergency Medicine | Admitting: Emergency Medicine

## 2019-03-28 ENCOUNTER — Other Ambulatory Visit: Payer: Self-pay

## 2019-03-28 ENCOUNTER — Encounter (HOSPITAL_COMMUNITY): Payer: Self-pay | Admitting: Emergency Medicine

## 2019-03-28 DIAGNOSIS — Y990 Civilian activity done for income or pay: Secondary | ICD-10-CM | POA: Insufficient documentation

## 2019-03-28 DIAGNOSIS — S29012A Strain of muscle and tendon of back wall of thorax, initial encounter: Secondary | ICD-10-CM | POA: Insufficient documentation

## 2019-03-28 DIAGNOSIS — T148XXA Other injury of unspecified body region, initial encounter: Secondary | ICD-10-CM

## 2019-03-28 DIAGNOSIS — J45909 Unspecified asthma, uncomplicated: Secondary | ICD-10-CM | POA: Diagnosis not present

## 2019-03-28 DIAGNOSIS — Y93F2 Activity, caregiving, lifting: Secondary | ICD-10-CM | POA: Diagnosis not present

## 2019-03-28 DIAGNOSIS — Y92129 Unspecified place in nursing home as the place of occurrence of the external cause: Secondary | ICD-10-CM | POA: Insufficient documentation

## 2019-03-28 DIAGNOSIS — X500XXA Overexertion from strenuous movement or load, initial encounter: Secondary | ICD-10-CM | POA: Diagnosis not present

## 2019-03-28 DIAGNOSIS — Z79899 Other long term (current) drug therapy: Secondary | ICD-10-CM | POA: Diagnosis not present

## 2019-03-28 DIAGNOSIS — S299XXA Unspecified injury of thorax, initial encounter: Secondary | ICD-10-CM | POA: Diagnosis present

## 2019-03-28 DIAGNOSIS — Z87891 Personal history of nicotine dependence: Secondary | ICD-10-CM | POA: Diagnosis not present

## 2019-03-28 NOTE — ED Triage Notes (Signed)
Pt c/o generalized back pain x 1 week. Ambulatory without difficulty, denies injury/trauma.

## 2019-03-28 NOTE — ED Provider Notes (Signed)
Jamie [name] Provider Note  CSN: [id number] Arrival date & time: 03/28/19 0009  Chief Complaint(s) Back Pain  HPI Jamie [name] is a 28 y.o. female who presents to the emergency [name] with bilateral thoracic back pain that is been ongoing for 1 week described as aching.  Mild to moderate in severity.  Worse with movements, range of motion and palpation.  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  Pain is nonradiating.  She denies any lower extremity weakness or loss of sensation.  No other physical complaints.  HPI  Past Medical History Past Medical History:  Diagnosis Date  . Anemia   . Asthma   . Mental disorder    depression  . Seizure [facility])    Patient Active Problem List   Diagnosis Date Noted  . Mass of lower outer quadrant of left breast 11/25/2017  . RLQ abdominal pain 11/25/2017  . Missed period 11/25/2017  . History of ovarian cyst 11/25/2017  . Breast abscess 03/16/2017  . Pregnancy examination or test, negative result 03/16/2017  . Encounter for gynecological examination with Papanicolaou smear of cervix 03/16/2017  . Patient desires pregnancy 03/16/2017  . Soreness breast 12/02/2016  . Nausea vomiting and diarrhea 11/05/2016  . Depression 11/05/2016  . Fatigue 11/05/2016  . Missed periods 11/05/2016  . Lower abdominal pain 11/05/2016  . Intractable vomiting with nausea 10/10/2016  . Appendicitis 01/13/2014  . Severe sepsis ([place]) 01/13/2014  . Intra-abdominal abscess ([place]) 01/13/2014   Home Medication(s) Prior to Admission medications   Medication Sig Start Date End Date Taking? Authorizing Provider  albuterol (PROAIR HFA) 108 (90 Base) MCG/ACT inhaler Inhale 2 puffs into the lungs every 4 (four) hours as needed for wheezing or shortness of breath (or coughing).     Provider, Historical, Jamie [name]  cyclobenzaprine (FLEXERIL) 5 MG tablet Take 1 tablet (5 mg total) by  mouth at bedtime as needed for muscle spasms. 12/11/18   Jaynee Eagles, PA-C  FLUoxetine (PROZAC) 20 MG tablet Take 20 mg by mouth daily.    Provider, Historical, Jamie [name]  meloxicam (MOBIC) 15 MG tablet Take 15 mg by mouth daily.    Provider, Historical, Jamie [name]  predniSONE (DELTASONE) 20 MG tablet Take 2 tablets daily with breakfast. 12/11/18   Jaynee Eagles, PA-C  zonisamide (ZONEGRAN) 100 MG capsule Take 300 mg by mouth 3 (three) times daily.     Provider, Historical, Jamie [name]  medroxyPROGESTERone (PROVERA) 10 MG tablet Take 1 tablet (10 mg total) by mouth daily. Patient not taking: Reported on 07/08/2018 04/09/18 12/11/18  Jamie Pence, Jamie [name]  promethazine (PHENERGAN) 25 MG tablet Take 1 tablet (25 mg total) by mouth every 6 (six) hours as needed for nausea or vomiting. Patient not taking: Reported on 04/09/2018 08/11/17 12/11/18  Jamie Dooms, Jamie [name]  Past Surgical History Past Surgical History:  Procedure Laterality Date  . APPENDECTOMY    . CESAREAN SECTION    . DRAINAGE TUBE REMOVAL ([place] HX)     Family History Family History  Adopted: Yes  Family history unknown: Yes    Social History Social History   Tobacco Use  . Smoking status: Former Smoker    Packs/day: 0.50    Years: 5.00    Pack years: 2.50    Types: Cigarettes    Quit date: 04/14/2012    Years since quitting: 6.9  . Smokeless tobacco: Never Used  Substance Use Topics  . Alcohol use: No  . Drug use: No   Allergies Patient has no known allergies.  Review of Systems Review of Systems All other systems are reviewed and are negative for acute change except as noted in the HPI  Physical Exam Vital Signs  I have reviewed the triage vital signs BP 110/67 (BP Location: Right Arm)   Pulse 76   Temp 97.9 F (36.6 C) (Oral)   Resp 17   SpO2 100%   Physical Exam Vitals reviewed.  Constitutional:       General: She is not in acute distress.    Appearance: She is well-developed. She is not diaphoretic.  HENT:     Head: Normocephalic and atraumatic.     Right Ear: External ear normal.     Left Ear: External ear normal.     Nose: Nose normal.  Eyes:     General: No scleral icterus.    Conjunctiva/sclera: Conjunctivae normal.  Neck:     Trachea: Phonation normal.  Cardiovascular:     Rate and Rhythm: Normal rate and regular rhythm.  Pulmonary:     Effort: Pulmonary effort is normal. No respiratory distress.     Breath sounds: No stridor.  Abdominal:     General: There is no distension.  Musculoskeletal:        General: Normal range of motion.     Cervical back: Normal range of motion. No tenderness or bony tenderness.     Thoracic back: Spasms and tenderness present. No bony tenderness.     Lumbar back: No tenderness or bony tenderness.       Back:  Neurological:     Mental Status: She is alert and oriented to person, place, and time.     Comments: Spine Exam: Strength: 5/5 throughout LE bilaterally  Sensation: Intact to light touch in proximal and distal LE bilaterally Reflexes: 1+ quadriceps and achilles reflexes   Psychiatric:        Behavior: Behavior normal.     ED Results and Treatments Labs (all labs ordered are listed, but only abnormal results are displayed) Labs Reviewed - No data to display                                                                                                                       EKG  EKG Interpretation  Date/Time:  Ventricular Rate:    PR Interval:    QRS Duration:   QT Interval:    QTC Calculation:   R Axis:     Text Interpretation:        Radiology No results found.  Pertinent labs & imaging results that were available during my care of the patient were reviewed by me and considered in my medical decision making (see chart for details).  Medications Ordered in ED Medications - No data to display                                                                                                                                   Procedures Procedures  (including critical care time)  Medical Decision Making / ED Course I have reviewed the nursing notes for this encounter and the patient's prior records (if available in EHR or on provided paperwork).   VICTOREA SENDELBACH was evaluated in Emergency [name] on 03/28/2019 for the symptoms described in the history of present illness. She was evaluated in the context of the global COVID-19 pandemic, which necessitated consideration that the patient might be at risk for infection with the SARS-CoV-2 virus that causes COVID-19. Institutional protocols and algorithms that pertain to the evaluation of patients at risk for COVID-19 are in a state of rapid change based on information released by regulatory bodies including the CDC and federal and state organizations. These policies and algorithms were followed during the patient's care in the ED.  28 y.o. female presents with back pain in thoracic area for 1 week without signs of radicular pain. No acute traumatic onset. No red flag symptoms.   Suspect MSK etiology. No indication for imaging emergently. Patient was recommended to take short course of scheduled NSAIDs and engage in early mobility as definitive treatment. Return precautions discussed for worsening or new concerning symptoms.        Final Clinical Impression(s) / ED Diagnoses Final diagnoses:  Muscle strain    The patient appears reasonably screened and/or stabilized for discharge and I doubt any other medical condition or other [facility] requiring further screening, evaluation, or treatment in the ED at this time prior to discharge.  Disposition: Discharge  Condition: Good  I have discussed the results, Dx and Tx plan with the patient who expressed understanding and agree(s) with the plan. Discharge instructions discussed at great length. The patient was  given strict return precautions who verbalized understanding of the instructions. No further questions at time of discharge.    ED Discharge Orders    None       Follow Up: Center, [place] [id number] [phone]  Schedule an appointment as soon as possible for a visit  As needed      This chart was dictated using voice recognition software.  Despite best efforts to proofread,  errors can occur which can change the documentation meaning.   Addison Lank  Jamie Cancel, Jamie [name] 03/28/19 [phone]

## 2019-03-28 NOTE — Discharge Instructions (Addendum)
You may use over-the-counter Motrin (Ibuprofen), Acetaminophen (Tylenol), topical muscle creams such as SalonPas, Icy Hot, Bengay, etc. Please stretch, apply heat, and have massage therapy for additional assistance. ° °

## 2019-03-31 ENCOUNTER — Telehealth: Payer: Medicaid Other | Admitting: Obstetrics and Gynecology

## 2019-04-04 ENCOUNTER — Encounter: Payer: Self-pay | Admitting: Obstetrics and Gynecology

## 2019-04-04 ENCOUNTER — Telehealth (INDEPENDENT_AMBULATORY_CARE_PROVIDER_SITE_OTHER): Payer: Medicaid Other | Admitting: Obstetrics and Gynecology

## 2019-04-04 ENCOUNTER — Other Ambulatory Visit: Payer: Self-pay

## 2019-04-04 DIAGNOSIS — N926 Irregular menstruation, unspecified: Secondary | ICD-10-CM

## 2019-04-04 MED ORDER — MEDROXYPROGESTERONE ACETATE 10 MG PO TABS: 10.0000 mg | ORAL_TABLET | Freq: Every day | ORAL | 0 refills | Status: DC

## 2019-04-04 NOTE — Progress Notes (Signed)
    TELEHEALTH GYNECOLOGY VIRTUAL VIDEO VISIT ENCOUNTER NOTE  Provider location: Center for [facility] at [place]   I connected with Jamie [name] on 04/04/19 at  4:15 PM EST by MyChart Video Encounter at home and verified that I am speaking with the correct person using two identifiers.   I discussed the limitations, risks, security and privacy concerns of performing an evaluation and management service virtually and the availability of in person appointments. I also discussed with the patient that there may be a patient responsible charge related to this service. The patient expressed understanding and agreed to proceed.   History:  Jamie [name] is a 28 y.o. [phone] female being evaluated today for secondary amenorrhea. Has not had a period since October 2020. Was a normal period for her. Lasted 5-7 days, was not heavy. Has not taken a pregnancy test.  She denies any abnormal vaginal discharge, pelvic pain or other concerns.       Past Medical History:  Diagnosis Date  . Anemia   . Asthma   . Mental disorder    depression  . Seizure [facility])    Past Surgical History:  Procedure Laterality Date  . APPENDECTOMY    . CESAREAN SECTION    . DRAINAGE TUBE REMOVAL ([place] HX)     The following portions of the patient's history were reviewed and updated as appropriate: allergies, current medications, past family history, past medical history, past social history, past surgical history and problem list.    Review of Systems:  Pertinent items noted in HPI and remainder of comprehensive ROS otherwise negative.  Physical Exam:   General:  Alert, oriented and cooperative. Patient appears to be in no acute distress.  Mental Status: Normal mood and affect. Normal behavior. Normal judgment and thought content.   Respiratory: Normal respiratory effort, no problems with respiration noted  Rest of physical exam deferred due to type of encounter  Labs and Imaging No results found  for this or any previous visit (from the past 336 hour(s)). No results found.     Assessment and Plan:   1. Missed menses No abnormal lab values thus far Provera challenge, then base management on results Answered all questions Likely will need referral to REI    I discussed the assessment and treatment plan with the patient. The patient was provided an opportunity to ask questions and all were answered. The patient agreed with the plan and demonstrated an understanding of the instructions.   The patient was advised to call back or seek an in-person evaluation/go to the ED if the symptoms worsen or if the condition fails to improve as anticipated.  I provided 15 minutes of face-to-face time during this encounter.   Sloan Leiter, MD Center for [place], [place]

## 2019-04-04 NOTE — Progress Notes (Signed)
I connected with  Jamie [name] on 04/04/19 at  4:15 PM EST by telephone and verified that I am speaking with the correct person using two identifiers.   I discussed the limitations, risks, security and privacy concerns of performing an evaluation and management service by telephone and the availability of in person appointments. I also discussed with the patient that there may be a patient responsible charge related to this service. The patient expressed understanding and agreed to proceed.  Verdell Carmine, RN 04/04/2019  3:50 PM

## 2019-04-20 ENCOUNTER — Emergency Department (HOSPITAL_COMMUNITY)
Admission: EM | Admit: 2019-04-20 | Discharge: 2019-04-20 | Disposition: A | Discharge status: Home / Self Care | Payer: Medicaid Other | Attending: Emergency Medicine | Admitting: Emergency Medicine

## 2019-04-20 ENCOUNTER — Other Ambulatory Visit: Payer: Self-pay

## 2019-04-20 ENCOUNTER — Encounter (HOSPITAL_COMMUNITY): Payer: Self-pay | Admitting: Emergency Medicine

## 2019-04-20 DIAGNOSIS — T24112A Burn of first degree of left thigh, initial encounter: Secondary | ICD-10-CM | POA: Diagnosis not present

## 2019-04-20 DIAGNOSIS — X12XXXA Contact with other hot fluids, initial encounter: Secondary | ICD-10-CM | POA: Insufficient documentation

## 2019-04-20 DIAGNOSIS — T24211A Burn of second degree of right thigh, initial encounter: Secondary | ICD-10-CM | POA: Diagnosis not present

## 2019-04-20 DIAGNOSIS — Z87891 Personal history of nicotine dependence: Secondary | ICD-10-CM | POA: Insufficient documentation

## 2019-04-20 DIAGNOSIS — Y939 Activity, unspecified: Secondary | ICD-10-CM | POA: Diagnosis not present

## 2019-04-20 DIAGNOSIS — T22112A Burn of first degree of left forearm, initial encounter: Secondary | ICD-10-CM | POA: Insufficient documentation

## 2019-04-20 DIAGNOSIS — Y929 Unspecified place or not applicable: Secondary | ICD-10-CM | POA: Insufficient documentation

## 2019-04-20 DIAGNOSIS — J45909 Unspecified asthma, uncomplicated: Secondary | ICD-10-CM | POA: Insufficient documentation

## 2019-04-20 DIAGNOSIS — Z79899 Other long term (current) drug therapy: Secondary | ICD-10-CM | POA: Insufficient documentation

## 2019-04-20 DIAGNOSIS — Y999 Unspecified external cause status: Secondary | ICD-10-CM | POA: Diagnosis not present

## 2019-04-20 DIAGNOSIS — T24011A Burn of unspecified degree of right thigh, initial encounter: Secondary | ICD-10-CM | POA: Diagnosis present

## 2019-04-20 MED ORDER — SILVER SULFADIAZINE 1 % EX CREA
TOPICAL_CREAM | Freq: Once | CUTANEOUS | Status: DC
  Filled 2019-04-20: qty 85

## 2019-04-20 MED ORDER — HYDROCODONE-ACETAMINOPHEN 5-325 MG PO TABS: 2.0000 | ORAL_TABLET | Freq: Four times a day (QID) | ORAL | 0 refills | Status: AC | PRN

## 2019-04-20 MED ORDER — LIDOCAINE HCL URETHRAL/MUCOSAL 2 % EX GEL
1.0000 "application " | Freq: Once | CUTANEOUS | Status: AC
  Administered 2019-04-20: 1 via TOPICAL
  Filled 2019-04-20: qty 20

## 2019-04-20 MED ORDER — ACETAMINOPHEN 500 MG PO TABS
1000.0000 mg | ORAL_TABLET | Freq: Once | ORAL | Status: AC
  Administered 2019-04-20: 1000 mg via ORAL
  Filled 2019-04-20: qty 2

## 2019-04-20 NOTE — Discharge Instructions (Addendum)
Apply topical antibiotics twice daily for 1 week.  Watch for signs of infection such as spreading redness, fevers, vomiting.  Use Tylenol, Motrin and cool water/ice for pain.  Use crutches as needed. For severe pain take norco or vicodin however realize they have the potential for addiction and it can make you sleepy and has tylenol in it.  No operating machinery while taking.

## 2019-04-20 NOTE — ED Notes (Signed)
Ortho tech and patient report one of blisters opened and drained some while walking.

## 2019-04-20 NOTE — Progress Notes (Signed)
Orthopedic Tech Progress Note Patient Details:  Jamie [name] 12/20/1990 [id number]  Ortho Devices Type of Ortho Device: Crutches Ortho Device/Splint Interventions: Ordered, Application, Adjustment   Post Interventions Patient Tolerated: Well Instructions Provided: Care of device, Adjustment of device   Karolee Stamps 04/20/2019, 2:09 PM

## 2019-04-20 NOTE — ED Notes (Signed)
Reviewed use of crutches with patient, she said she has used them before. Family waiting on patient in lobby.
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
## 2019-04-27 ENCOUNTER — Telehealth: Payer: Self-pay

## 2019-04-27 ENCOUNTER — Other Ambulatory Visit: Payer: Self-pay

## 2019-04-27 ENCOUNTER — Encounter: Payer: Self-pay | Admitting: Plastic Surgery

## 2019-04-27 ENCOUNTER — Ambulatory Visit (INDEPENDENT_AMBULATORY_CARE_PROVIDER_SITE_OTHER): Payer: Medicaid Other | Admitting: Plastic Surgery

## 2019-04-27 VITALS — BP 122/83 | HR 89 | Temp 97.5°F | Ht 65.0 in | Wt 184.0 lb

## 2019-04-27 DIAGNOSIS — T300 Burn of unspecified body region, unspecified degree: Secondary | ICD-10-CM | POA: Diagnosis not present

## 2019-04-27 MED ORDER — HYDROCODONE-ACETAMINOPHEN 5-325 MG PO TABS: 1.0000 | ORAL_TABLET | ORAL | 0 refills | Status: DC | PRN

## 2019-04-27 NOTE — Telephone Encounter (Signed)
Faxed order for medical supplies to [facility]
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
## 2019-04-28 ENCOUNTER — Other Ambulatory Visit: Payer: Self-pay | Admitting: Obstetrics and Gynecology

## 2019-04-28 DIAGNOSIS — N911 Secondary amenorrhea: Secondary | ICD-10-CM

## 2019-05-02 ENCOUNTER — Telehealth: Payer: Self-pay | Admitting: Obstetrics and Gynecology

## 2019-05-02 NOTE — Telephone Encounter (Signed)
Called the patient to inform of the upcoming appointment with ultrasound. Left a voicemail advising of the date and time. If any questions or concerns please contact our office.

## 2019-05-04 ENCOUNTER — Telehealth (INDEPENDENT_AMBULATORY_CARE_PROVIDER_SITE_OTHER): Payer: Medicaid Other | Admitting: Lactation Services

## 2019-05-04 DIAGNOSIS — Z3200 Encounter for pregnancy test, result unknown: Secondary | ICD-10-CM

## 2019-05-04 NOTE — Telephone Encounter (Signed)
Pt called and reports she heard from the [facility]. She reports she has not made an appt with them yet. Reviewed with her that the Referral was sent in November.   She reports she had a period for 1 day around the end of December and then had unprotected sex the following day. She reports about a week ago and she was spotting light pink for a few hours. The spotting has stopped since. She reports her stomach is painful in her uterine area. She has been nauseated.    Sent message to front desk to call pt and schedule her to come in for a UPT. Pt voiced understanding. Pt to have [place] on 1/28, informed her to keep appt.

## 2019-05-10 ENCOUNTER — Telehealth: Payer: Self-pay | Admitting: Plastic Surgery

## 2019-05-10 NOTE — Telephone Encounter (Signed)
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
## 2019-05-11 ENCOUNTER — Ambulatory Visit: Payer: Medicaid Other | Admitting: Surgical

## 2019-05-11 NOTE — Telephone Encounter (Signed)
Patient has appointment today. Will discuss during that time.

## 2019-05-12 ENCOUNTER — Ambulatory Visit (HOSPITAL_COMMUNITY)
Admission: RE | Admit: 2019-05-12 | Discharge: 2019-05-12 | Disposition: A | Discharge status: Home / Self Care | Payer: Medicaid Other | Source: Ambulatory Visit | Attending: Obstetrics and Gynecology | Admitting: Obstetrics and Gynecology

## 2019-05-12 ENCOUNTER — Other Ambulatory Visit: Payer: Self-pay

## 2019-05-12 DIAGNOSIS — N911 Secondary amenorrhea: Secondary | ICD-10-CM

## 2019-05-19 ENCOUNTER — Ambulatory Visit (HOSPITAL_COMMUNITY)
Admission: RE | Admit: 2019-05-19 | Discharge: 2019-05-19 | Disposition: A | Discharge status: Home / Self Care | Payer: Medicaid Other | Source: Ambulatory Visit | Attending: Obstetrics and Gynecology | Admitting: Obstetrics and Gynecology

## 2019-05-19 ENCOUNTER — Other Ambulatory Visit: Payer: Self-pay

## 2019-05-19 DIAGNOSIS — N911 Secondary amenorrhea: Secondary | ICD-10-CM | POA: Insufficient documentation

## 2019-05-24 ENCOUNTER — Ambulatory Visit: Payer: Medicaid Other

## 2019-06-30 ENCOUNTER — Emergency Department (HOSPITAL_COMMUNITY)
Admission: EM | Admit: 2019-06-30 | Discharge: 2019-06-30 | Disposition: A | Discharge status: Home / Self Care | Payer: Medicaid Other | Attending: Emergency Medicine | Admitting: Emergency Medicine

## 2019-06-30 ENCOUNTER — Encounter (HOSPITAL_COMMUNITY): Payer: Self-pay

## 2019-06-30 ENCOUNTER — Other Ambulatory Visit: Payer: Self-pay

## 2019-06-30 DIAGNOSIS — L03211 Cellulitis of face: Secondary | ICD-10-CM | POA: Insufficient documentation

## 2019-06-30 DIAGNOSIS — Y939 Activity, unspecified: Secondary | ICD-10-CM | POA: Diagnosis not present

## 2019-06-30 DIAGNOSIS — W57XXXA Bitten or stung by nonvenomous insect and other nonvenomous arthropods, initial encounter: Secondary | ICD-10-CM | POA: Insufficient documentation

## 2019-06-30 DIAGNOSIS — Y999 Unspecified external cause status: Secondary | ICD-10-CM | POA: Diagnosis not present

## 2019-06-30 DIAGNOSIS — Z87891 Personal history of nicotine dependence: Secondary | ICD-10-CM | POA: Insufficient documentation

## 2019-06-30 DIAGNOSIS — S0096XA Insect bite (nonvenomous) of unspecified part of head, initial encounter: Secondary | ICD-10-CM

## 2019-06-30 DIAGNOSIS — Y929 Unspecified place or not applicable: Secondary | ICD-10-CM | POA: Insufficient documentation

## 2019-06-30 MED ORDER — CEPHALEXIN 500 MG PO CAPS: 500.0000 mg | ORAL_CAPSULE | Freq: Four times a day (QID) | ORAL | 0 refills | Status: AC

## 2019-06-30 NOTE — Discharge Instructions (Signed)
You can take 1000 mg of Tylenol.  Do not exceed 4000 mg of Tylenol a day.  Take antibiotics as directed. Please take all of your antibiotics until finished.  Return for any worsening redness, swelling, fevers, difficulty opening or closing her mouth, difficulty breathing or any worsening concerning symptoms.

## 2019-06-30 NOTE — ED Triage Notes (Signed)
Pt reports spider bite to left side of cheek.

## 2019-06-30 NOTE — ED Notes (Signed)
Pt and visitor urgent to leave to catch bus. Declined d/c vital signs and ED discharge e-signature. Discharge paperwork reviewed with patient; reviewed home instructions from ED provider, prescription medication, and follow-up care. Pt verbalized understanding of discharge instructions.

## 2019-06-30 NOTE — ED Provider Notes (Signed)
[place] EMERGENCY DEPARTMENT Provider Note   CSN: [id number] Arrival date & time: 06/30/19  1212     History Chief Complaint  Patient presents with  . Insect Bite    Jamie [name] is a 29 y.o. female with PMH/o anemia, Asthma, Seizure who presents for evaluation of insect bite.  She states that she was bitten by spider a few days ago to her left face.  She states that she visualized the insect and states that it looked like a spider bite specifically what kind.  Since then, she has had any area of pain, redness, swelling to her face.  She also feels like the area has been hot.  No active drainage.  Has not taken any medications for the symptoms.  She states that it is causing her whole face to hurt.  She has not had any systemic fever, vomiting, breathing.  The history is provided by the patient.       Past Medical History:  Diagnosis Date  . Anemia   . Asthma   . Mental disorder    depression  . Seizure [facility])     Patient Active Problem List   Diagnosis Date Noted  . Mass of lower outer quadrant of left breast 11/25/2017  . RLQ abdominal pain 11/25/2017  . Missed period 11/25/2017  . History of ovarian cyst 11/25/2017  . Breast abscess 03/16/2017  . Pregnancy examination or test, negative result 03/16/2017  . Encounter for gynecological examination with Papanicolaou smear of cervix 03/16/2017  . Patient desires pregnancy 03/16/2017  . Soreness breast 12/02/2016  . Nausea vomiting and diarrhea 11/05/2016  . Depression 11/05/2016  . Fatigue 11/05/2016  . Missed periods 11/05/2016  . Lower abdominal pain 11/05/2016  . Intractable vomiting with nausea 10/10/2016  . Appendicitis 01/13/2014  . Severe sepsis ([place]) 01/13/2014  . Intra-abdominal abscess ([place]) 01/13/2014    Past Surgical History:  Procedure Laterality Date  . APPENDECTOMY    . CESAREAN SECTION    . DRAINAGE TUBE REMOVAL ([place] HX)       OB History    Gravida  4   Para  1    Term  1   Preterm      AB  3   Living  1     SAB  3   TAB      Ectopic      Multiple      Live Births  1           Family History  Adopted: Yes  Family history unknown: Yes    Social History   Tobacco Use  . Smoking status: Former Smoker    Packs/day: 0.50    Years: 5.00    Pack years: 2.50    Types: Cigarettes    Quit date: 04/14/2012    Years since quitting: 7.2  . Smokeless tobacco: Never Used  Substance Use Topics  . Alcohol use: No  . Drug use: No    Home Medications Prior to Admission medications   Medication Sig Start Date End Date Taking? Authorizing Provider  albuterol (PROAIR HFA) 108 (90 Base) MCG/ACT inhaler Inhale 2 puffs into the lungs every 4 (four) hours as needed for wheezing or shortness of breath (or coughing).     [provider]  cephALEXin (KEFLEX) 500 MG capsule Take 1 capsule (500 mg total) by mouth 4 (four) times daily for 7 days. 06/30/19 07/07/19  Volanda Napoleon, PA-C  cyclobenzaprine (FLEXERIL) 5 MG  tablet Take 1 tablet (5 mg total) by mouth at bedtime as needed for muscle spasms. 12/11/18   Jaynee Eagles, PA-C  FLUoxetine (PROZAC) 20 MG tablet Take 20 mg by mouth daily.    [provider]  HYDROcodone-acetaminophen (NORCO) 5-325 MG tablet Take 1 tablet by mouth every 4 (four) hours as needed for moderate pain. 04/27/19   Cindra Presume, MD  medroxyPROGESTERone (PROVERA) 10 MG tablet Take 1 tablet (10 mg total) by mouth daily. 04/04/19   Sloan Leiter, MD  meloxicam (MOBIC) 15 MG tablet Take 15 mg by mouth daily.    [provider]  predniSONE (DELTASONE) 20 MG tablet Take 2 tablets daily with breakfast. 12/11/18   Jaynee Eagles, PA-C  zonisamide (ZONEGRAN) 100 MG capsule Take 300 mg by mouth 3 (three) times daily.     [provider]  promethazine (PHENERGAN) 25 MG tablet Take 1 tablet (25 mg total) by mouth every 6 (six) hours as needed for nausea or vomiting. Patient not taking: Reported on  04/09/2018 08/11/17 12/11/18  Estill Dooms, NP    Allergies    Amoxicillin, Motrin [ibuprofen], Penicillins, and Tylenol [acetaminophen]  Review of Systems   Review of Systems  Constitutional: Negative for fever.  Respiratory: Negative for shortness of breath.   Gastrointestinal: Negative for vomiting.  Skin: Positive for color change and wound.  All other systems reviewed and are negative.   Physical Exam Updated Vital Signs BP 117/84 (BP Location: Left Arm)   Pulse 71   Temp 98.3 F (36.8 C) (Oral)   Resp 18   Ht 5\' 5"  (1.651 m)   Wt 84.4 kg   SpO2 100%   BMI 30.95 kg/m   Physical Exam Vitals and nursing note reviewed.  Constitutional:      Appearance: She is well-developed.  HENT:     Head: Normocephalic and atraumatic.      Comments: Face is a centimeter area with small central area of swelling overlying the left cheek..  No fluctuance.  No drainage.  No trismus noted.  Airways patent, phonation is intact. Eyes:     General: No scleral icterus.       Right eye: No discharge.        Left eye: No discharge.     Conjunctiva/sclera: Conjunctivae normal.     Comments: PERRL. EOMs intact. No nystagmus. No neglect.  No periorbital involvement.  Pulmonary:     Effort: Pulmonary effort is normal.  Skin:    General: Skin is warm and dry.  Neurological:     Mental Status: She is alert.     Comments: Cranial nerves III-XII intact No slurred speech. No facial droop.   Psychiatric:        Speech: Speech normal.        Behavior: Behavior normal.     ED Results / Procedures / Treatments   Labs (all labs ordered are listed, but only abnormal results are displayed) Labs Reviewed  POC URINE PREG, ED    EKG None  Radiology No results found.  Procedures Procedures (including critical care time)  Medications Ordered in ED Medications - No data to display  ED Course  I have reviewed the triage vital signs and the nursing notes.  Pertinent labs & imaging  results that were available during my care of the patient were reviewed by me and considered in my medical decision making (see chart for details).    MDM Rules/Calculators/A&P  29 year old female who presents for evaluation of pain, redness, swelling of the face.  She thinks it began after being bitten by a spider.  No fever but states area has been warm to touch.  No drainage. Patient is afebrile, non-toxic appearing, sitting comfortably on examination table. Vital signs reviewed and stable.  On exam, she has a small area of pain, redness, swelling noted to the base.  There is no central fluctuance.  No active drainage.  Exam consistent with insect bite.  Doubt abscess but even so, there is no fluctuance that would be amenable to I&D.  No neuro deficits noted cranial nerves.  He has no periorbital involvement.  Patient is currently 3 months pregnant and is allergic to penicillins.  Review of the records show that she has tolerated Keflex in the past for UTIs.  She sent her home on a short course of Keflex for symptomatic relief.  Encouraged at home supportive care measures. At this time, patient exhibits no emergent life-threatening condition that require further evaluation in ED or admission. Patient had ample opportunity for questions and discussion. All patient's questions were answered with full understanding. Strict return precautions discussed. Patient expresses understanding and agreement to plan.   Portions of this note were generated with Lobbyist. Dictation errors may occur despite best attempts at proofreading.  Final Clinical Impression(s) / ED Diagnoses Final diagnoses:  Insect bite of head, unspecified part, initial encounter  Cellulitis of face    Rx / DC Orders ED Discharge Orders         Ordered    cephALEXin (KEFLEX) 500 MG capsule  4 times daily     06/30/19 1302           Desma Mcgregor 06/30/19 [place], Ankit,  MD 07/04/19 1557

## 2019-09-17 ENCOUNTER — Other Ambulatory Visit: Payer: Self-pay

## 2019-09-17 ENCOUNTER — Encounter (HOSPITAL_COMMUNITY): Payer: Self-pay | Admitting: Emergency Medicine

## 2019-09-17 ENCOUNTER — Ambulatory Visit (HOSPITAL_COMMUNITY)
Admission: EM | Admit: 2019-09-17 | Discharge: 2019-09-17 | Disposition: A | Discharge status: Home / Self Care | Payer: Medicaid Other

## 2019-09-17 ENCOUNTER — Emergency Department (HOSPITAL_COMMUNITY)
Admission: EM | Admit: 2019-09-17 | Discharge: 2019-09-17 | Disposition: A | Discharge status: Home / Self Care | Payer: Medicaid Other | Attending: Emergency Medicine | Admitting: Emergency Medicine

## 2019-09-17 DIAGNOSIS — R569 Unspecified convulsions: Secondary | ICD-10-CM | POA: Diagnosis not present

## 2019-09-17 DIAGNOSIS — J45909 Unspecified asthma, uncomplicated: Secondary | ICD-10-CM | POA: Insufficient documentation

## 2019-09-17 DIAGNOSIS — R55 Syncope and collapse: Secondary | ICD-10-CM | POA: Insufficient documentation

## 2019-09-17 DIAGNOSIS — R42 Dizziness and giddiness: Secondary | ICD-10-CM | POA: Insufficient documentation

## 2019-09-17 DIAGNOSIS — Z87891 Personal history of nicotine dependence: Secondary | ICD-10-CM | POA: Diagnosis not present

## 2019-09-17 DIAGNOSIS — R519 Headache, unspecified: Secondary | ICD-10-CM | POA: Diagnosis not present

## 2019-09-17 DIAGNOSIS — Z79899 Other long term (current) drug therapy: Secondary | ICD-10-CM | POA: Insufficient documentation

## 2019-09-17 LAB — CBC
HCT: 38.4 % (ref 36.0–46.0)
Hemoglobin: 12.1 g/dL (ref 12.0–15.0)
MCH: 27.1 pg (ref 26.0–34.0)
MCHC: 31.5 g/dL (ref 30.0–36.0)
MCV: 85.9 fL (ref 80.0–100.0)
Platelets: 282 10*3/uL (ref 150–400)
RBC: 4.47 MIL/uL (ref 3.87–5.11)
RDW: 13.1 % (ref 11.5–15.5)
WBC: 6.7 10*3/uL (ref 4.0–10.5)
nRBC: 0 % (ref 0.0–0.2)

## 2019-09-17 LAB — BASIC METABOLIC PANEL
Anion gap: 4 — ABNORMAL LOW (ref 5–15)
BUN: 11 mg/dL (ref 6–20)
CO2: 26 mmol/L (ref 22–32)
Calcium: 8.8 mg/dL — ABNORMAL LOW (ref 8.9–10.3)
Chloride: 109 mmol/L (ref 98–111)
Creatinine, Ser: 0.95 mg/dL (ref 0.44–1.00)
GFR calc Af Amer: >60 mL/min (ref >60)
GFR calc non Af Amer: >60 mL/min (ref >60)
Glucose, Bld: 82 mg/dL (ref 70–99)
Potassium: 3.2 mmol/L — ABNORMAL LOW (ref 3.5–5.1)
Sodium: 139 mmol/L (ref 135–145)

## 2019-09-17 LAB — I-STAT BETA HCG BLOOD, ED (MC, WL, AP ONLY): I-stat hCG, quantitative: <5 m[IU]/mL (ref <5)

## 2019-09-17 LAB — CBG MONITORING, ED
Glucose-Capillary: 77 mg/dL (ref 70–99)
Glucose-Capillary: 79 mg/dL (ref 70–99)

## 2019-09-17 MED ORDER — ZONISAMIDE 100 MG PO CAPS: 300.0000 mg | ORAL_CAPSULE | Freq: Three times a day (TID) | ORAL | 2 refills | Status: AC

## 2019-09-17 MED ORDER — SODIUM CHLORIDE 0.9% FLUSH: 3.0000 mL | Freq: Once | INTRAVENOUS | Status: DC

## 2019-09-17 NOTE — ED Notes (Signed)
Spoke to [place], pa about complaint and history.  Patient to go to ED for services.

## 2019-09-17 NOTE — ED Notes (Signed)
Patient is being discharged from the Urgent Care and sent to the Emergency Department via private vehicle . Per [place], [place], patient is in need of higher level of care due to complaint and history. Patient is aware and verbalizes understanding of plan of care.  Vitals:   09/17/19 1633  BP: 113/68  Pulse: 85  Resp: 18  Temp: 98.8 F (37.1 C)  SpO2: 100%

## 2019-09-17 NOTE — ED Triage Notes (Addendum)
Reports getting very hot and then dizzy.  Then patient had a syncopal episode. Complains of head pain and left side of ribs.   Patient woke sitting in a chair.  Reports sister was with her throughout, not currently available.  Patient is constantly on phone texting during interview.  Denies specific area of head hurting.    States history of seizures, "maybe have them at night"

## 2019-09-17 NOTE — ED Provider Notes (Signed)
[place] EMERGENCY DEPARTMENT Provider Note   CSN: [id number] Arrival date & time: 09/17/19  1650     History Chief Complaint  Patient presents with   Loss of Consciousness    Jamie [name] is a 29 y.o. female.  HPI Patient with history of seizures, no longer taking seizure medications for the last several years reports she was at work today where it was hot and she was standing for a prolonged period. She reports she began to feel lightheaded and dizzy. The next she was aware she woke up in a chair in the break room. Her sister is also a [employer] and told her afterwards that she had had a seizure. At the time of my evaluation she report some mild headache but otherwise is at baseline.      Past Medical History:  Diagnosis Date   Anemia    Asthma    Mental disorder    depression   Seizure [facility])     Patient Active Problem List   Diagnosis Date Noted   Mass of lower outer quadrant of left breast 11/25/2017   RLQ abdominal pain 11/25/2017   Missed period 11/25/2017   History of ovarian cyst 11/25/2017   Breast abscess 03/16/2017   Pregnancy examination or test, negative result 03/16/2017   Encounter for gynecological examination with Papanicolaou smear of cervix 03/16/2017   Patient desires pregnancy 03/16/2017   Soreness breast 12/02/2016   Nausea vomiting and diarrhea 11/05/2016   Depression 11/05/2016   Fatigue 11/05/2016   Missed periods 11/05/2016   Lower abdominal pain 11/05/2016   Intractable vomiting with nausea 10/10/2016   Appendicitis 01/13/2014   Severe sepsis ([place]) 01/13/2014   Intra-abdominal abscess ([place]) 01/13/2014    Past Surgical History:  Procedure Laterality Date   APPENDECTOMY     CESAREAN SECTION     DRAINAGE TUBE REMOVAL ([place] HX)       OB History    Gravida  4   Para  1   Term  1   Preterm      AB  3   Living  1     SAB  3   TAB      Ectopic      Multiple      Live  Births  1           Family History  Adopted: Yes  Family history unknown: Yes    Social History   Tobacco Use   Smoking status: Former Smoker    Packs/day: 0.50    Years: 5.00    Pack years: 2.50    Types: Cigarettes    Quit date: 04/14/2012    Years since quitting: 7.4   Smokeless tobacco: Never Used  Substance Use Topics   Alcohol use: No   Drug use: No    Home Medications Prior to Admission medications   Medication Sig Start Date End Date Taking? Authorizing Provider  albuterol (PROAIR HFA) 108 (90 Base) MCG/ACT inhaler Inhale 2 puffs into the lungs every 4 (four) hours as needed for wheezing or shortness of breath (or coughing).     [provider]  cyclobenzaprine (FLEXERIL) 5 MG tablet Take 1 tablet (5 mg total) by mouth at bedtime as needed for muscle spasms. 12/11/18   Jaynee Eagles, PA-C  FLUoxetine (PROZAC) 20 MG tablet Take 20 mg by mouth daily.    [provider]  HYDROcodone-acetaminophen (NORCO) 5-325 MG tablet Take 1 tablet by mouth every 4 (four)  hours as needed for moderate pain. 04/27/19   Cindra Presume, MD  medroxyPROGESTERone (PROVERA) 10 MG tablet Take 1 tablet (10 mg total) by mouth daily. 04/04/19   Sloan Leiter, MD  meloxicam (MOBIC) 15 MG tablet Take 15 mg by mouth daily.    [provider]  predniSONE (DELTASONE) 20 MG tablet Take 2 tablets daily with breakfast. 12/11/18   Jaynee Eagles, PA-C  zonisamide (ZONEGRAN) 100 MG capsule Take 3 capsules (300 mg total) by mouth 3 (three) times daily. 09/17/19 12/16/19  Truddie Hidden, MD  promethazine (PHENERGAN) 25 MG tablet Take 1 tablet (25 mg total) by mouth every 6 (six) hours as needed for nausea or vomiting. Patient not taking: Reported on 04/09/2018 08/11/17 12/11/18  Estill Dooms, NP    Allergies    Amoxicillin, Motrin [ibuprofen], Penicillins, and Tylenol [acetaminophen]  Review of Systems   Review of Systems A comprehensive review of systems was completed and  negative except as noted in HPI.   Physical Exam Updated Vital Signs BP 108/73 (BP Location: Right Arm)    Pulse 71    Temp 98.3 F (36.8 C) (Oral)    Resp 16    Ht 5' 5.5" (1.664 m)    Wt 87.1 kg    SpO2 100%    BMI 31.46 kg/m   Physical Exam Vitals and nursing note reviewed.  Constitutional:      Appearance: Normal appearance.  HENT:     Head: Normocephalic and atraumatic.     Nose: Nose normal.     Mouth/Throat:     Mouth: Mucous membranes are moist.  Eyes:     Extraocular Movements: Extraocular movements intact.     Conjunctiva/sclera: Conjunctivae normal.  Cardiovascular:     Rate and Rhythm: Normal rate.  Pulmonary:     Effort: Pulmonary effort is normal.     Breath sounds: Normal breath sounds.  Abdominal:     General: Abdomen is flat.     Palpations: Abdomen is soft.     Tenderness: There is no abdominal tenderness.  Musculoskeletal:        General: No swelling. Normal range of motion.     Cervical back: Neck supple.  Skin:    General: Skin is warm and dry.  Neurological:     General: No focal deficit present.     Mental Status: She is alert.  Psychiatric:        Mood and Affect: Mood normal.     ED Results / Procedures / Treatments   Labs (all labs ordered are listed, but only abnormal results are displayed) Labs Reviewed  BASIC METABOLIC PANEL - Abnormal; Notable for the following components:      Result Value   Potassium 3.2 (*)    Calcium 8.8 (*)    Anion gap 4 (*)    All other components within normal limits  CBC  URINALYSIS, ROUTINE W REFLEX MICROSCOPIC  CBG MONITORING, ED  I-STAT BETA HCG BLOOD, ED ([facility], [facility], [facility] ONLY)  CBG MONITORING, ED    EKG EKG Interpretation  Date/Time:  Saturday September 17 2019 18:02:00 EDT Ventricular Rate:  71 PR Interval:  158 QRS Duration: 78 QT Interval:  400 QTC Calculation: 434 R Axis:   45 Text Interpretation: Normal sinus rhythm Normal ECG No significant change since last tracing Confirmed by Calvert Cantor [phone]) on 09/17/2019 6:57:26 PM   Radiology No results found.  Procedures Procedures (including critical care time)  Medications Ordered in ED  Medications  sodium chloride flush (NS) 0.9 % injection 3 mL (has no administration in time range)    ED Course  I have reviewed the triage vital signs and the nursing notes.  Pertinent labs & imaging results that were available during my care of the patient were reviewed by me and considered in my medical decision making (see chart for details).    MDM Rules/Calculators/A&P                      Patient with syncope vs seizure. She has had basic labs drawn prior to my evaluation which are unremarkable. She states she needs to leave to catch the bus. Given Rx for her seizure medications. ADvised to followup with PCP. Do not drive until cleared by PCP. RTED for any other concerns.  Final Clinical Impression(s) / ED Diagnoses Final diagnoses:  Syncope, unspecified syncope type  Seizure-like activity ([place])    Rx / DC Orders ED Discharge Orders         Ordered    zonisamide (ZONEGRAN) 100 MG capsule  3 times daily     09/17/19 2026           Truddie Hidden, MD 09/17/19 2036

## 2019-09-17 NOTE — ED Triage Notes (Signed)
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  Reports syncopal episode and that she "woke up" in a chair.  Reports "whole head hurting" and L sided rib pain.  States she may have had a seizure.  Hx of seizures- states she took herself off seizure medication approx 3 years ago.

## 2019-10-17 ENCOUNTER — Emergency Department (HOSPITAL_COMMUNITY): Payer: Medicaid Other

## 2019-10-17 ENCOUNTER — Encounter (HOSPITAL_COMMUNITY): Payer: Self-pay

## 2019-10-17 ENCOUNTER — Emergency Department (HOSPITAL_COMMUNITY)
Admission: EM | Admit: 2019-10-17 | Discharge: 2019-10-17 | Disposition: A | Discharge status: Home / Self Care | Payer: Medicaid Other | Attending: Emergency Medicine | Admitting: Emergency Medicine

## 2019-10-17 ENCOUNTER — Ambulatory Visit (HOSPITAL_COMMUNITY)
Admission: EM | Admit: 2019-10-17 | Discharge: 2019-10-17 | Disposition: A | Discharge status: Home / Self Care | Payer: Medicaid Other

## 2019-10-17 ENCOUNTER — Other Ambulatory Visit: Payer: Self-pay

## 2019-10-17 DIAGNOSIS — N939 Abnormal uterine and vaginal bleeding, unspecified: Secondary | ICD-10-CM | POA: Diagnosis not present

## 2019-10-17 DIAGNOSIS — R102 Pelvic and perineal pain: Secondary | ICD-10-CM | POA: Diagnosis present

## 2019-10-17 DIAGNOSIS — Z87891 Personal history of nicotine dependence: Secondary | ICD-10-CM | POA: Insufficient documentation

## 2019-10-17 LAB — CBC WITH DIFFERENTIAL/PLATELET
Abs Immature Granulocytes: 0.02 10*3/uL (ref 0.00–0.07)
Basophils Absolute: 0.1 10*3/uL (ref 0.0–0.1)
Basophils Relative: 1 %
Eosinophils Absolute: 0.1 10*3/uL (ref 0.0–0.5)
Eosinophils Relative: 1 %
HCT: 39.1 % (ref 36.0–46.0)
Hemoglobin: 12.1 g/dL (ref 12.0–15.0)
Immature Granulocytes: 0 %
Lymphocytes Relative: 32 %
Lymphs Abs: 2.3 10*3/uL (ref 0.7–4.0)
MCH: 26.9 pg (ref 26.0–34.0)
MCHC: 30.9 g/dL (ref 30.0–36.0)
MCV: 86.9 fL (ref 80.0–100.0)
Monocytes Absolute: 0.6 10*3/uL (ref 0.1–1.0)
Monocytes Relative: 8 %
Neutro Abs: 4.3 10*3/uL (ref 1.7–7.7)
Neutrophils Relative %: 58 %
Platelets: 277 10*3/uL (ref 150–400)
RBC: 4.5 MIL/uL (ref 3.87–5.11)
RDW: 13.4 % (ref 11.5–15.5)
WBC: 7.3 10*3/uL (ref 4.0–10.5)
nRBC: 0 % (ref 0.0–0.2)

## 2019-10-17 LAB — BASIC METABOLIC PANEL
Anion gap: 9 (ref 5–15)
BUN: 8 mg/dL (ref 6–20)
CO2: 22 mmol/L (ref 22–32)
Calcium: 8.5 mg/dL — ABNORMAL LOW (ref 8.9–10.3)
Chloride: 105 mmol/L (ref 98–111)
Creatinine, Ser: 0.88 mg/dL (ref 0.44–1.00)
GFR calc Af Amer: >60 mL/min (ref >60)
GFR calc non Af Amer: >60 mL/min (ref >60)
Glucose, Bld: 110 mg/dL — ABNORMAL HIGH (ref 70–99)
Potassium: 3.4 mmol/L — ABNORMAL LOW (ref 3.5–5.1)
Sodium: 136 mmol/L (ref 135–145)

## 2019-10-17 LAB — TYPE AND SCREEN
ABO/RH(D): B POS
Antibody Screen: NEGATIVE

## 2019-10-17 LAB — WET PREP, GENITAL
Sperm: NONE SEEN
Trich, Wet Prep: NONE SEEN
Yeast Wet Prep HPF POC: NONE SEEN

## 2019-10-17 LAB — HCG, QUANTITATIVE, PREGNANCY: hCG, Beta Chain, Quant, S: <1 m[IU]/mL (ref <5)

## 2019-10-17 LAB — POC URINE PREG, ED: Preg Test, Ur: NEGATIVE

## 2019-10-17 MED ORDER — OXYCODONE HCL 5 MG PO TABS
5.0000 mg | ORAL_TABLET | Freq: Once | ORAL | Status: AC
  Administered 2019-10-17: 5 mg via ORAL
  Filled 2019-10-17: qty 1

## 2019-10-17 MED ORDER — OXYCODONE HCL 5 MG PO TABS: 5.0000 mg | ORAL_TABLET | Freq: Four times a day (QID) | ORAL | 0 refills | Status: DC | PRN

## 2019-10-17 NOTE — ED Provider Notes (Signed)
[place] EMERGENCY DEPARTMENT Provider Note   CSN: [id number] Arrival date & time: 10/17/19  1816     History Chief Complaint  Patient presents with  . Vaginal Bleeding    Jamie [name] is a 29 y.o. female.  Patient with what she thought was a positive pregnancy test about 5 weeks ago.  Had some light vaginal bleeding over the last several days but heavier bleeding today.  Lower abdominal pain associated with that.  The history is provided by the patient.  Vaginal Bleeding Quality:  Heavier than menses and clots Severity:  Moderate Onset quality:  Gradual Timing:  Constant Progression:  Unchanged Chronicity:  New Context: spontaneously   Relieved by:  Nothing Worsened by:  Nothing Associated symptoms: abdominal pain   Associated symptoms: no back pain, no dyspareunia, no dysuria, no fatigue, no fever, no nausea and no vaginal discharge        Past Medical History:  Diagnosis Date  . Anemia   . Asthma   . Mental disorder    depression  . Seizure [facility])     Patient Active Problem List   Diagnosis Date Noted  . Mass of lower outer quadrant of left breast 11/25/2017  . RLQ abdominal pain 11/25/2017  . Missed period 11/25/2017  . History of ovarian cyst 11/25/2017  . Breast abscess 03/16/2017  . Pregnancy examination or test, negative result 03/16/2017  . Encounter for gynecological examination with Papanicolaou smear of cervix 03/16/2017  . Patient desires pregnancy 03/16/2017  . Soreness breast 12/02/2016  . Nausea vomiting and diarrhea 11/05/2016  . Depression 11/05/2016  . Fatigue 11/05/2016  . Missed periods 11/05/2016  . Lower abdominal pain 11/05/2016  . Intractable vomiting with nausea 10/10/2016  . Appendicitis 01/13/2014  . Severe sepsis ([place]) 01/13/2014  . Intra-abdominal abscess ([place]) 01/13/2014    Past Surgical History:  Procedure Laterality Date  . APPENDECTOMY    . CESAREAN SECTION    . DRAINAGE TUBE REMOVAL ([place]  HX)       OB History    Gravida  4   Para  1   Term  1   Preterm      AB  3   Living  1     SAB  3   TAB      Ectopic      Multiple      Live Births  1           Family History  Adopted: Yes  Family history unknown: Yes    Social History   Tobacco Use  . Smoking status: Former Smoker    Packs/day: 0.50    Years: 5.00    Pack years: 2.50    Types: Cigarettes    Quit date: 04/14/2012    Years since quitting: 7.5  . Smokeless tobacco: Never Used  Substance Use Topics  . Alcohol use: No  . Drug use: No    Home Medications Prior to Admission medications   Medication Sig Start Date End Date Taking? Authorizing Provider  albuterol (PROAIR HFA) 108 (90 Base) MCG/ACT inhaler Inhale 2 puffs into the lungs every 4 (four) hours as needed for wheezing or shortness of breath (or coughing).    Yes [provider]  zonisamide (ZONEGRAN) 100 MG capsule Take 3 capsules (300 mg total) by mouth 3 (three) times daily. 09/17/19 12/16/19 Yes Truddie Hidden, MD  HYDROcodone-acetaminophen (NORCO) 5-325 MG tablet Take 1 tablet by mouth every 4 (four) hours as  needed for moderate pain. Patient not taking: Reported on 10/17/2019 04/27/19   Cindra Presume, MD  medroxyPROGESTERone (PROVERA) 10 MG tablet Take 1 tablet (10 mg total) by mouth daily. Patient not taking: Reported on 10/17/2019 04/04/19   Sloan Leiter, MD  oxyCODONE (ROXICODONE) 5 MG immediate release tablet Take 1 tablet (5 mg total) by mouth every 6 (six) hours as needed for up to 15 doses for severe pain. 10/17/19   Kordelia Severin, DO  promethazine (PHENERGAN) 25 MG tablet Take 1 tablet (25 mg total) by mouth every 6 (six) hours as needed for nausea or vomiting. Patient not taking: Reported on 04/09/2018 08/11/17 12/11/18  Estill Dooms, NP    Allergies    Amoxicillin, Tylenol [acetaminophen], Motrin [ibuprofen], and Penicillins  Review of Systems   Review of Systems  Constitutional: Negative for chills,  fatigue and fever.  HENT: Negative for ear pain and sore throat.   Eyes: Negative for pain and visual disturbance.  Respiratory: Negative for cough and shortness of breath.   Cardiovascular: Negative for chest pain and palpitations.  Gastrointestinal: Positive for abdominal pain. Negative for nausea and vomiting.  Genitourinary: Positive for menstrual problem, pelvic pain and vaginal bleeding. Negative for difficulty urinating, dyspareunia, dysuria, enuresis, flank pain, frequency, genital sores, hematuria and vaginal discharge.  Musculoskeletal: Negative for arthralgias and back pain.  Skin: Negative for color change and rash.  Neurological: Negative for seizures and syncope.  All other systems reviewed and are negative.   Physical Exam Updated Vital Signs  ED Triage Vitals  Enc Vitals Group     BP 10/17/19 1837 138/77     Pulse Rate 10/17/19 1837 81     Resp 10/17/19 1837 18     Temp 10/17/19 1837 98 F (36.7 C)     Temp Source 10/17/19 1837 Oral     SpO2 10/17/19 1837 100 %     Weight --      Height --      Head Circumference --      Peak Flow --      Pain Score 10/17/19 1911 10     Pain Loc --      Pain Edu? --      Excl. in [place]? --     Physical Exam Vitals and nursing note reviewed. Exam conducted with a chaperone present.  Constitutional:      General: She is not in acute distress.    Appearance: She is well-developed.  HENT:     Head: Normocephalic and atraumatic.     Nose: Nose normal.     Mouth/Throat:     Mouth: Mucous membranes are moist.  Eyes:     Extraocular Movements: Extraocular movements intact.     Conjunctiva/sclera: Conjunctivae normal.     Pupils: Pupils are equal, round, and reactive to light.  Cardiovascular:     Rate and Rhythm: Normal rate and regular rhythm.     Heart sounds: No murmur heard.   Pulmonary:     Effort: Pulmonary effort is normal. No respiratory distress.     Breath sounds: Normal breath sounds.  Abdominal:      Palpations: Abdomen is soft.     Tenderness: There is abdominal tenderness (diffusely).  Genitourinary:    General: Normal vulva.     Vagina: Bleeding present. No vaginal discharge.     Cervix: Normal.     Uterus: Normal.      Adnexa: Right adnexa normal and left adnexa normal.  Right: No mass or tenderness.         Left: No mass or tenderness.       Comments: Scant blood in the vaginal vault, cervix appears closed, no fetal tissue Musculoskeletal:        General: Normal range of motion.     Cervical back: Normal range of motion and neck supple.  Skin:    General: Skin is warm and dry.     Capillary Refill: Capillary refill takes less than 2 seconds.  Neurological:     General: No focal deficit present.     Mental Status: She is alert.  Psychiatric:        Mood and Affect: Mood normal.     ED Results / Procedures / Treatments   Labs (all labs ordered are listed, but only abnormal results are displayed) Labs Reviewed  WET PREP, GENITAL - Abnormal; Notable for the following components:      Result Value   Clue Cells Wet Prep HPF POC PRESENT (*)    WBC, Wet Prep HPF POC FEW (*)    All other components within normal limits  BASIC METABOLIC PANEL - Abnormal; Notable for the following components:   Potassium 3.4 (*)    Glucose, Bld 110 (*)    Calcium 8.5 (*)    All other components within normal limits  CBC WITH DIFFERENTIAL/PLATELET  HCG, QUANTITATIVE, PREGNANCY  POC URINE PREG, ED  TYPE AND SCREEN  GC/CHLAMYDIA PROBE AMP ([place]) NOT AT [facility]    EKG None  Radiology US PELVIC COMPLETE W TRANSVAGINAL AND TORSION R/O  Result Date: 10/17/2019 CLINICAL DATA:  Pelvic pain and vaginal bleeding EXAM: TRANSABDOMINAL AND TRANSVAGINAL ULTRASOUND OF PELVIS DOPPLER ULTRASOUND OF OVARIES TECHNIQUE: Both transabdominal and transvaginal ultrasound examinations of the pelvis were performed. Transabdominal technique was performed for global imaging of the pelvis including  uterus, ovaries, adnexal regions, and pelvic cul-de-sac. It was necessary to proceed with endovaginal exam following the transabdominal exam to visualize the ovaries. Color and duplex Doppler ultrasound was utilized to evaluate blood flow to the ovaries. COMPARISON:  05/19/2019 FINDINGS: Uterus Measurements: 9.3 x 4.5 x 6.3 cm. = volume: 138 mL. Uterus is retroflexed. No fibroids or other mass visualized. Endometrium Thickness: 11 mm.  No focal abnormality visualized. Right ovary Measurements: 5.0 x 2.6 x 4.1 cm. = volume: 28 mL. Normal appearance/no adnexal mass. Left ovary Measurements: 4.0 x 2.5 x 3.6 cm. = volume: 19 mL. Normal appearance/no adnexal mass. Pulsed Doppler evaluation of both ovaries demonstrates normal low-resistance arterial and venous waveforms. Other findings No abnormal free fluid. IMPRESSION: No acute abnormality noted. Previously seen left ovarian cyst has resolved in the interval. Electronically Signed   By: Inez Catalina M.D.   On: 10/17/2019 22:55    Procedures Procedures (including critical care time)  Medications Ordered in ED Medications  oxyCODONE (Oxy IR/ROXICODONE) immediate release tablet 5 mg (5 mg Oral Given 10/17/19 2149)    ED Course  I have reviewed the triage vital signs and the nursing notes.  Pertinent labs & imaging results that were available during my care of the patient were reviewed by me and considered in my medical decision making (see chart for details).    MDM Rules/Calculators/A&P                          YALISSA FINK is a 29 year old female with history of seizures, asthma who presents to the ED with vaginal bleeding.  Patient with normal vitals.  No fever.  Patient states that she had a positive pregnancy test about 5 weeks ago.  However both urine and blood pregnancy test is negative.  Had some scant vaginal bleeding the last several days and then heavy vaginal bleeding with lower abdominal pain that started today.  Appears upon chart  review history of hemorrhagic cyst in the past.  She has had her appendix removed in the past.  Patient with overall unremarkable GU exam.  There is some scant blood in the vaginal vault.  Cervix is closed.  Overall have low suspicion that this is a miscarriage as I would expect for hCG to be elevated.  She cannot remember the last time she had a menstrual cycle. Overall suspect some abnormal uterine bleeding.  Will get ultrasound to make sure that there are no other reasons for heavy bleeding such as fibroid or hemorrhagic cyst.  Low suspicion for torsion.  We can also evaluate to see if there are any retained products of conception given hCG seems less likely.    Pelvic ultrasound normal.  Suspect abnormal uterine bleeding and severe cramping.  No retained products of conception.  No torsion.  Will prescribe narcotics for breakthrough pain.  Recommend follow-up with OB/GYN.  Patient not on birth control.  Lab work unremarkable.  Wet prep with clue cells but asymptomatic.  Vital signs normal.  Hemoglobin normal.  Understands return precautions.  This chart was dictated using voice recognition software.  Despite best efforts to proofread,  errors can occur which can change the documentation meaning.    Final Clinical Impression(s) / ED Diagnoses Final diagnoses:  Vaginal bleeding  Abnormal uterine bleeding (AUB)    Rx / DC Orders ED Discharge Orders         Ordered    oxyCODONE (ROXICODONE) 5 MG immediate release tablet  Every 6 hours PRN     Discontinue  Reprint     10/17/19 2305           Lennice Sites, DO 10/17/19 2306

## 2019-10-17 NOTE — ED Provider Notes (Signed)
MSE was initiated and I personally evaluated the patient and placed orders (if any) at  7:04 PM on October 17, 2019.  The patient appears stable so that the remainder of the MSE may be completed by another provider.  Patient placed in Quick Look pathway, seen and evaluated   Chief Complaint: Vaginal bleeding  HPI:   29 year old female currently pregnant at [redacted] weeks gestation presenting to the ED for vaginal bleeding.  Reports generalized abdominal discomfort as well.  States that she had a positive home pregnancy test about 5 weeks ago.  This is her second pregnancy.  She does report some fatigue since bleeding began.  Denies anticoagulant use.  ROS: Vaginal bleeding (one)  Physical Exam:   Gen: No distress  Neuro: Awake and Alert  Skin: Warm    Focused Exam: Abdomen is generally tender without rebound or guarding.  She is ambulating without difficulty. Patient is hemodynamically stable here.   Initiation of care has begun. The patient has been counseled on the process, plan, and necessity for staying for the completion/evaluation, and the remainder of the medical screening examination  7:05 PM We will need to obtain pregnancy test prior to transfer to MAU.  7:09 PM Point-of-care urine pregnancy test is negative.  I have ordered CBC, BMP, type and screen.  At this time she will not be transferred to MAU.   Delia Heady, PA-C 10/17/19 1911    Lennice Sites, DO 10/17/19 2101

## 2019-10-17 NOTE — ED Notes (Signed)
Patient transported to Ultrasound 

## 2019-10-17 NOTE — ED Triage Notes (Addendum)
Pt arrives to ED w/ c/o vaginal bleeding and 10/10 abdominal pain. Pt states she had a positive pregnancy test 5 weeks ago.

## 2019-10-17 NOTE — ED Notes (Signed)
Pt sitting outside. 

## 2019-10-18 LAB — GC/CHLAMYDIA PROBE AMP (~~LOC~~) NOT AT ARMC
Chlamydia: NEGATIVE
Comment: NEGATIVE
Comment: NORMAL
Neisseria Gonorrhea: NEGATIVE

## 2019-11-10 ENCOUNTER — Ambulatory Visit: Payer: Medicaid Other | Admitting: Obstetrics and Gynecology

## 2019-11-21 ENCOUNTER — Other Ambulatory Visit: Payer: Self-pay

## 2019-11-21 ENCOUNTER — Emergency Department (HOSPITAL_COMMUNITY)
Admission: EM | Admit: 2019-11-21 | Discharge: 2019-11-21 | Disposition: A | Discharge status: Home / Self Care | Payer: Medicaid Other | Attending: Emergency Medicine | Admitting: Emergency Medicine

## 2019-11-21 ENCOUNTER — Encounter (HOSPITAL_COMMUNITY): Payer: Self-pay | Admitting: Emergency Medicine

## 2019-11-21 DIAGNOSIS — Z5321 Procedure and treatment not carried out due to patient leaving prior to being seen by health care provider: Secondary | ICD-10-CM | POA: Diagnosis not present

## 2019-11-21 DIAGNOSIS — N939 Abnormal uterine and vaginal bleeding, unspecified: Secondary | ICD-10-CM | POA: Insufficient documentation

## 2019-11-21 LAB — I-STAT BETA HCG BLOOD, ED (MC, WL, AP ONLY): I-stat hCG, quantitative: <5 m[IU]/mL (ref <5)

## 2019-11-21 LAB — CBC WITH DIFFERENTIAL/PLATELET
Abs Immature Granulocytes: 0 10*3/uL (ref 0.00–0.07)
Basophils Absolute: 0.1 10*3/uL (ref 0.0–0.1)
Basophils Relative: 2 %
Eosinophils Absolute: 0.1 10*3/uL (ref 0.0–0.5)
Eosinophils Relative: 3 %
HCT: 40.1 % (ref 36.0–46.0)
Hemoglobin: 12.7 g/dL (ref 12.0–15.0)
Lymphocytes Relative: 49 %
Lymphs Abs: 1.9 10*3/uL (ref 0.7–4.0)
MCH: 26.5 pg (ref 26.0–34.0)
MCHC: 31.7 g/dL (ref 30.0–36.0)
MCV: 83.5 fL (ref 80.0–100.0)
Monocytes Absolute: 0.2 10*3/uL (ref 0.1–1.0)
Monocytes Relative: 6 %
Neutro Abs: 1.5 10*3/uL — ABNORMAL LOW (ref 1.7–7.7)
Neutrophils Relative %: 40 %
Platelets: 221 10*3/uL (ref 150–400)
RBC: 4.8 MIL/uL (ref 3.87–5.11)
RDW: 12.9 % (ref 11.5–15.5)
WBC: 3.8 10*3/uL — ABNORMAL LOW (ref 4.0–10.5)
nRBC: 0 % (ref 0.0–0.2)
nRBC: 0 /100{WBCs}

## 2019-11-21 LAB — BASIC METABOLIC PANEL WITH GFR
Anion gap: 11 (ref 5–15)
BUN: 6 mg/dL (ref 6–20)
CO2: 25 mmol/L (ref 22–32)
Calcium: 9 mg/dL (ref 8.9–10.3)
Chloride: 104 mmol/L (ref 98–111)
Creatinine, Ser: 1.02 mg/dL — ABNORMAL HIGH (ref 0.44–1.00)
GFR calc Af Amer: >60 mL/min
GFR calc non Af Amer: >60 mL/min
Glucose, Bld: 105 mg/dL — ABNORMAL HIGH (ref 70–99)
Potassium: 3.1 mmol/L — ABNORMAL LOW (ref 3.5–5.1)
Sodium: 140 mmol/L (ref 135–145)

## 2019-11-21 NOTE — ED Triage Notes (Signed)
Pt. Stated, Jamie [name] been bleeding vaginally for a month.

## 2019-11-21 NOTE — ED Notes (Signed)
pT WAS ADVIUSED TO STAY BUT PT LEFT DUE TO WAIT TIME.

## 2019-11-24 ENCOUNTER — Encounter (HOSPITAL_COMMUNITY): Payer: Self-pay | Admitting: Emergency Medicine

## 2019-11-24 ENCOUNTER — Other Ambulatory Visit: Payer: Self-pay

## 2019-11-24 ENCOUNTER — Ambulatory Visit (HOSPITAL_COMMUNITY)
Admission: EM | Admit: 2019-11-24 | Discharge: 2019-11-24 | Disposition: A | Discharge status: Home / Self Care | Payer: Medicaid Other | Attending: Family Medicine | Admitting: Family Medicine

## 2019-11-24 DIAGNOSIS — R519 Headache, unspecified: Secondary | ICD-10-CM | POA: Diagnosis not present

## 2019-11-24 DIAGNOSIS — N921 Excessive and frequent menstruation with irregular cycle: Secondary | ICD-10-CM | POA: Diagnosis not present

## 2019-11-24 DIAGNOSIS — J4520 Mild intermittent asthma, uncomplicated: Secondary | ICD-10-CM | POA: Diagnosis not present

## 2019-11-24 MED ORDER — MEDROXYPROGESTERONE ACETATE 10 MG PO TABS
10.0000 mg | ORAL_TABLET | Freq: Three times a day (TID) | ORAL | 0 refills | Status: DC
Start: 2019-11-24 — End: 2020-07-07

## 2019-11-24 MED ORDER — ALBUTEROL SULFATE HFA 108 (90 BASE) MCG/ACT IN AERS: 2.0000 | INHALATION_SPRAY | RESPIRATORY_TRACT | 0 refills | Status: AC | PRN

## 2019-11-24 MED ORDER — BUTALBITAL-APAP-CAFFEINE 50-325-40 MG PO TABS: 1.0000 | ORAL_TABLET | Freq: Four times a day (QID) | ORAL | 0 refills | Status: DC | PRN

## 2019-11-24 NOTE — ED Triage Notes (Signed)
Sob and headaches for 2-3 weeks.  Reports no appetite.   Patient continues to have vaginal bleeding.  Seen in ED 8/9 for the same.  Patient denies getting any medicines in ed-patient left without being seen

## 2019-11-24 NOTE — Discharge Instructions (Signed)
Use the inhaler as needed Take the fioricet as needed headache Take the provera for the vaginal bleeding You must follow up with the GYN

## 2019-11-24 NOTE — ED Notes (Signed)
Patient not cooperative for covid test

## 2019-11-24 NOTE — ED Provider Notes (Signed)
[place]    CSN: [id number] Arrival date & time: 11/24/19  1312      History   Chief Complaint Chief Complaint  Patient presents with  . Shortness of Breath    HPI Jamie [name] is a 28 y.o. female.   HPI Patient is here with several complaints #1 she would like a refill of her albuterol inhaler #2 she has been having headaches for the last couple of weeks.   #3 she is having irregular uterine bleeding, states that she has been having bleeding for a couple of weeks.  She states she feels "weak".  She states that she is out of her iron.  She went to the emergency room on 11/21/2019 for the same complaint but did not stay to be seen.  Her lab work at that time was stable.   #4 she states that she has had a cough and cold for about 2 weeks.  Denies exposure to Covid.  Denies loss of taste or smell, fever or chills, body aches or weakness Past Medical History:  Diagnosis Date  . Anemia   . Asthma   . Mental disorder    depression  . Seizure [facility])     Patient Active Problem List   Diagnosis Date Noted  . Mass of lower outer quadrant of left breast 11/25/2017  . RLQ abdominal pain 11/25/2017  . Missed period 11/25/2017  . History of ovarian cyst 11/25/2017  . Breast abscess 03/16/2017  . Pregnancy examination or test, negative result 03/16/2017  . Encounter for gynecological examination with Papanicolaou smear of cervix 03/16/2017  . Patient desires pregnancy 03/16/2017  . Soreness breast 12/02/2016  . Nausea vomiting and diarrhea 11/05/2016  . Depression 11/05/2016  . Fatigue 11/05/2016  . Missed periods 11/05/2016  . Lower abdominal pain 11/05/2016  . Intractable vomiting with nausea 10/10/2016  . Appendicitis 01/13/2014  . Severe sepsis ([place]) 01/13/2014  . Intra-abdominal abscess ([place]) 01/13/2014    Past Surgical History:  Procedure Laterality Date  . APPENDECTOMY    . CESAREAN SECTION    . DRAINAGE TUBE REMOVAL ([facility] HX)      OB History     Gravida  4   Para  1   Term  1   Preterm      AB  3   Living  1     SAB  3   TAB      Ectopic      Multiple      Live Births  1            Home Medications    Prior to Admission medications   Medication Sig Start Date End Date Taking? Authorizing Provider  zonisamide (ZONEGRAN) 100 MG capsule Take 3 capsules (300 mg total) by mouth 3 (three) times daily. 09/17/19 12/16/19 Yes Truddie Hidden, MD  albuterol [facility] HFA) 108 [phone] Base) MCG/ACT inhaler Inhale 2 puffs into the lungs every 4 (four) hours as needed for wheezing or shortness of breath (or coughing). 11/24/19   Raylene Everts, MD  butalbital-acetaminophen-caffeine [facility]) [phone] MG tablet Take 1-2 tablets by mouth every 6 (six) hours as needed for headache. 11/24/19 11/23/20  Raylene Everts, MD  medroxyPROGESTERone (PROVERA) 10 MG tablet Take 1 tablet (10 mg total) by mouth in the morning, at noon, and at bedtime. 11/24/19   Raylene Everts, MD  promethazine (PHENERGAN) 25 MG tablet Take 1 tablet (25 mg total) by mouth every 6 (six) hours as  needed for nausea or vomiting. Patient not taking: Reported on 04/09/2018 08/11/17 12/11/18  Estill Dooms, NP    Family History Family History  Adopted: Yes  Family history unknown: Yes    Social History Social History   Tobacco Use  . Smoking status: Former Smoker    Packs/day: 0.50    Years: 5.00    Pack years: 2.50    Types: Cigarettes    Quit date: 04/14/2012    Years since quitting: 7.6  . Smokeless tobacco: Never Used  Substance Use Topics  . Alcohol use: No  . Drug use: No     Allergies   Amoxicillin, Motrin [ibuprofen], and Penicillins   Review of Systems Review of Systems  See HPI Physical Exam Triage Vital Signs ED Triage Vitals  Enc Vitals Group     BP 11/24/19 1628 102/64     Pulse Rate 11/24/19 1628 72     Resp 11/24/19 1628 20     Temp 11/24/19 1628 98 F (36.7 C)     Temp Source 11/24/19 1628 Oral     SpO2  11/24/19 1628 100 %     Weight --      Height --      Head Circumference --      Peak Flow --      Pain Score 11/24/19 1624 9     Pain Loc --      Pain Edu? --      Excl. in [place]? --    No data found.  Updated Vital Signs BP 102/64 (BP Location: Left Arm)   Pulse 72   Temp 98 F (36.7 C) (Oral)   Resp 20   LMP 11/07/2019   SpO2 100%      Physical Exam Constitutional:      General: She is not in acute distress.    Appearance: She is well-developed and normal weight.  HENT:     Head: Normocephalic and atraumatic.     Mouth/Throat:     Mouth: Mucous membranes are moist.     Pharynx: No posterior oropharyngeal erythema.  Eyes:     Conjunctiva/sclera: Conjunctivae normal.     Pupils: Pupils are equal, round, and reactive to light.  Cardiovascular:     Rate and Rhythm: Normal rate and regular rhythm.     Heart sounds: Normal heart sounds.  Pulmonary:     Effort: Pulmonary effort is normal. No respiratory distress.     Breath sounds: Normal breath sounds.     Comments: Lungs are clear Abdominal:     General: There is no distension.     Palpations: Abdomen is soft.     Tenderness: There is no abdominal tenderness.     Comments: Abdomen benign  Musculoskeletal:        General: Normal range of motion.     Cervical back: Normal range of motion and neck supple.  Skin:    General: Skin is warm and dry.  Neurological:     General: No focal deficit present.     Mental Status: She is alert.  Psychiatric:        Mood and Affect: Mood normal.        Behavior: Behavior normal.      UC Treatments / Results  Labs (all labs ordered are listed, but only abnormal results are displayed) Labs Reviewed - No data to display  EKG   Radiology No results found.  Procedures Procedures (including critical care time)  Medications Ordered  in UC Medications - No data to display  Initial Impression / Assessment and Plan / UC Course  I have reviewed the triage vital signs and  the nursing notes.  Pertinent labs & imaging results that were available during my care of the patient were reviewed by me and considered in my medical decision making (see chart for details).     Patient is given medroxyprogesterone to try to help stop the bleeding She states she is allergic to NSAID drugs and ibuprofen so will provide Fioricet for her headaches Albuterol inhalers refilled Primary care follow-up is recommended Patient has a GYN to see about the bleeding Final Clinical Impressions(s) / UC Diagnoses   Final diagnoses:  Menorrhagia with irregular cycle  Bad headache  Mild intermittent asthma, unspecified whether complicated     Discharge Instructions     Use the inhaler as needed Take the fioricet as needed headache Take the provera for the vaginal bleeding You must follow up with the GYN    ED Prescriptions    Medication Sig Dispense Auth. Provider   albuterol (PROAIR HFA) 108 (90 Base) MCG/ACT inhaler Inhale 2 puffs into the lungs every 4 (four) hours as needed for wheezing or shortness of breath (or coughing). 18 g Raylene Everts, MD   butalbital-acetaminophen-caffeine (FIORICET) [phone] MG tablet Take 1-2 tablets by mouth every 6 (six) hours as needed for headache. 20 tablet Raylene Everts, MD   medroxyPROGESTERone (PROVERA) 10 MG tablet Take 1 tablet (10 mg total) by mouth in the morning, at noon, and at bedtime. 21 tablet Raylene Everts, MD     I have reviewed the PDMP during this encounter.   Raylene Everts, MD 11/24/19 1754

## 2020-02-06 ENCOUNTER — Encounter (HOSPITAL_COMMUNITY): Payer: Self-pay | Admitting: Emergency Medicine

## 2020-02-06 ENCOUNTER — Ambulatory Visit (HOSPITAL_COMMUNITY)
Admission: EM | Admit: 2020-02-06 | Discharge: 2020-02-06 | Disposition: A | Discharge status: Home / Self Care | Payer: Medicaid Other | Attending: Internal Medicine | Admitting: Internal Medicine

## 2020-02-06 ENCOUNTER — Other Ambulatory Visit: Payer: Self-pay

## 2020-02-06 ENCOUNTER — Emergency Department (HOSPITAL_COMMUNITY)
Admission: EM | Admit: 2020-02-06 | Discharge: 2020-02-06 | Disposition: A | Discharge status: Home / Self Care | Payer: Medicaid Other | Attending: Emergency Medicine | Admitting: Emergency Medicine

## 2020-02-06 DIAGNOSIS — M545 Low back pain, unspecified: Secondary | ICD-10-CM | POA: Insufficient documentation

## 2020-02-06 DIAGNOSIS — R110 Nausea: Secondary | ICD-10-CM | POA: Diagnosis not present

## 2020-02-06 DIAGNOSIS — R1030 Lower abdominal pain, unspecified: Secondary | ICD-10-CM | POA: Diagnosis not present

## 2020-02-06 DIAGNOSIS — R1084 Generalized abdominal pain: Secondary | ICD-10-CM | POA: Insufficient documentation

## 2020-02-06 DIAGNOSIS — Z5321 Procedure and treatment not carried out due to patient leaving prior to being seen by health care provider: Secondary | ICD-10-CM | POA: Diagnosis not present

## 2020-02-06 LAB — CBC
HCT: 37.6 % (ref 36.0–46.0)
Hemoglobin: 11.8 g/dL — ABNORMAL LOW (ref 12.0–15.0)
MCH: 26.9 pg (ref 26.0–34.0)
MCHC: 31.4 g/dL (ref 30.0–36.0)
MCV: 85.8 fL (ref 80.0–100.0)
Platelets: 264 10*3/uL (ref 150–400)
RBC: 4.38 MIL/uL (ref 3.87–5.11)
RDW: 12.6 % (ref 11.5–15.5)
WBC: 8.8 10*3/uL (ref 4.0–10.5)
nRBC: 0 % (ref 0.0–0.2)

## 2020-02-06 LAB — COMPREHENSIVE METABOLIC PANEL
ALT: 13 U/L (ref 0–44)
AST: 17 U/L (ref 15–41)
Albumin: 3.8 g/dL (ref 3.5–5.0)
Alkaline Phosphatase: 54 U/L (ref 38–126)
Anion gap: 7 (ref 5–15)
BUN: 10 mg/dL (ref 6–20)
CO2: 25 mmol/L (ref 22–32)
Calcium: 9.2 mg/dL (ref 8.9–10.3)
Chloride: 106 mmol/L (ref 98–111)
Creatinine, Ser: 0.85 mg/dL (ref 0.44–1.00)
GFR, Estimated: >60 mL/min (ref >60)
Glucose, Bld: 142 mg/dL — ABNORMAL HIGH (ref 70–99)
Potassium: 3.7 mmol/L (ref 3.5–5.1)
Sodium: 138 mmol/L (ref 135–145)
Total Bilirubin: 0.8 mg/dL (ref 0.3–1.2)
Total Protein: 7.2 g/dL (ref 6.5–8.1)

## 2020-02-06 LAB — POCT URINALYSIS DIPSTICK, ED / UC
Bilirubin Urine: NEGATIVE
Glucose, UA: NEGATIVE mg/dL
Hgb urine dipstick: NEGATIVE
Ketones, ur: NEGATIVE mg/dL
Leukocytes,Ua: NEGATIVE
Nitrite: NEGATIVE
Protein, ur: NEGATIVE mg/dL
Specific Gravity, Urine: 1.025 (ref 1.005–1.030)
Urobilinogen, UA: 0.2 mg/dL (ref 0.0–1.0)
pH: 6.5 (ref 5.0–8.0)

## 2020-02-06 LAB — POC URINE PREG, ED: Preg Test, Ur: NEGATIVE

## 2020-02-06 LAB — I-STAT BETA HCG BLOOD, ED (MC, WL, AP ONLY): I-stat hCG, quantitative: <5 m[IU]/mL (ref <5)

## 2020-02-06 LAB — LIPASE, BLOOD: Lipase: 28 U/L (ref 11–51)

## 2020-02-06 NOTE — ED Provider Notes (Signed)
[facility]    CSN: [id number] Arrival date & time: 02/06/20  1245      History   Chief Complaint Chief Complaint  Patient presents with  . Back Pain  . Abdominal Pain    HPI Jamie [name] is a 29 y.o. female patient comes to urgent care with complaints of lower abdominal pain which started this morning.  Patient describes pain as severe, constant, sharp, no known relieving factors.  Is associated with an episode of nonbloody nonbilious vomitus.  Patient denies any dysuria, urgency or frequency.  Her last menstrual period was 1 month ago.  She denies deep dyspareunia.  No vaginal discharge.  No constipation or diarrhea.  No abdominal distention.   HPI  Past Medical History:  Diagnosis Date  . Anemia   . Asthma   . Mental disorder    depression  . Seizure [facility])     Patient Active Problem List   Diagnosis Date Noted  . Mass of lower outer quadrant of left breast 11/25/2017  . RLQ abdominal pain 11/25/2017  . Missed period 11/25/2017  . History of ovarian cyst 11/25/2017  . Breast abscess 03/16/2017  . Pregnancy examination or test, negative result 03/16/2017  . Encounter for gynecological examination with Papanicolaou smear of cervix 03/16/2017  . Patient desires pregnancy 03/16/2017  . Soreness breast 12/02/2016  . Nausea vomiting and diarrhea 11/05/2016  . Depression 11/05/2016  . Fatigue 11/05/2016  . Missed periods 11/05/2016  . Lower abdominal pain 11/05/2016  . Intractable vomiting with nausea 10/10/2016  . Appendicitis 01/13/2014  . Severe sepsis ([place]) 01/13/2014  . Intra-abdominal abscess ([place]) 01/13/2014    Past Surgical History:  Procedure Laterality Date  . APPENDECTOMY    . CESAREAN SECTION    . DRAINAGE TUBE REMOVAL ([facility] HX)      OB History    Gravida  4   Para  1   Term  1   Preterm      AB  3   Living  1     SAB  3   TAB      Ectopic      Multiple      Live Births  1            Home Medications     Prior to Admission medications   Medication Sig Start Date End Date Taking? Authorizing Provider  albuterol (PROAIR HFA) 108 (90 Base) MCG/ACT inhaler Inhale 2 puffs into the lungs every 4 (four) hours as needed for wheezing or shortness of breath (or coughing). 11/24/19   Raylene Everts, MD  butalbital-acetaminophen-caffeine [facility]) [phone] MG tablet Take 1-2 tablets by mouth every 6 (six) hours as needed for headache. 11/24/19 11/23/20  Raylene Everts, MD  medroxyPROGESTERone (PROVERA) 10 MG tablet Take 1 tablet (10 mg total) by mouth in the morning, at noon, and at bedtime. 11/24/19   Raylene Everts, MD  zonisamide (ZONEGRAN) 100 MG capsule Take 3 capsules (300 mg total) by mouth 3 (three) times daily. 09/17/19 12/16/19  Truddie Hidden, MD  promethazine (PHENERGAN) 25 MG tablet Take 1 tablet (25 mg total) by mouth every 6 (six) hours as needed for nausea or vomiting. Patient not taking: Reported on 04/09/2018 08/11/17 12/11/18  Estill Dooms, NP    Family History Family History  Adopted: Yes  Family history unknown: Yes    Social History Social History   Tobacco Use  . Smoking status: Former Smoker    Packs/day:  0.50    Years: 5.00    Pack years: 2.50    Types: Cigarettes    Quit date: 04/14/2012    Years since quitting: 7.8  . Smokeless tobacco: Never Used  Substance Use Topics  . Alcohol use: No  . Drug use: No     Allergies   Amoxicillin, Motrin [ibuprofen], and Penicillins   Review of Systems Review of Systems  Constitutional: Negative.   Gastrointestinal: Positive for abdominal pain. Negative for diarrhea, nausea, rectal pain and vomiting.  Genitourinary: Negative for dysuria, frequency, urgency, vaginal discharge and vaginal pain.  Musculoskeletal: Negative.  Negative for arthralgias, joint swelling and myalgias.  Skin: Negative.   Neurological: Negative.  Negative for dizziness, speech difficulty and headaches.     Physical Exam Triage  Vital Signs ED Triage Vitals  Enc Vitals Group     BP 02/06/20 1451 129/74     Pulse Rate 02/06/20 1451 79     Resp --      Temp 02/06/20 1451 99.4 F (37.4 C)     Temp Source 02/06/20 1451 Oral     SpO2 02/06/20 1451 100 %     Weight --      Height --      Head Circumference --      Peak Flow --      Pain Score 02/06/20 1446 10     Pain Loc --      Pain Edu? --      Excl. in [place]? --    No data found.  Updated Vital Signs BP 129/74 (BP Location: Left Arm)   Pulse 79   Temp 99.4 F (37.4 C) (Oral)   LMP 01/15/2020   SpO2 100%   Visual Acuity Right Eye Distance:   Left Eye Distance:   Bilateral Distance:    Right Eye Near:   Left Eye Near:    Bilateral Near:     Physical Exam Vitals and nursing note reviewed.  Constitutional:      General: She is not in acute distress.    Appearance: She is not ill-appearing.  Cardiovascular:     Rate and Rhythm: Normal rate and regular rhythm.  Abdominal:     General: Bowel sounds are normal.     Palpations: Abdomen is soft. There is no shifting dullness, hepatomegaly or splenomegaly.     Tenderness: There is abdominal tenderness in the suprapubic area and left lower quadrant. There is no guarding or rebound. Negative signs include Rovsing's sign.     Hernia: No hernia is present.  Skin:    General: Skin is warm.  Neurological:     Mental Status: She is alert.      UC Treatments / Results  Labs (all labs ordered are listed, but only abnormal results are displayed) Labs Reviewed  POCT URINALYSIS DIPSTICK, ED / UC  POC URINE PREG, ED    EKG   Radiology No results found.  Procedures Procedures (including critical care time)  Medications Ordered in UC Medications - No data to display  Initial Impression / Assessment and Plan / UC Course  I have reviewed the triage vital signs and the nursing notes.  Pertinent labs & imaging results that were available during my care of the patient were reviewed by me and  considered in my medical decision making (see chart for details).     1.  Severe lower abdominal pain: Point-of-care urinalysis, urine pregnancy test are negative. Given the severity of the abdominal pain and  tenderness on palpation, patient will need imaging to further evaluate the cause of the abdominal pain.  Patient will be advised to go to the ED for further evaluation.  Patient is agreeable to the plan. Final Clinical Impressions(s) / UC Diagnoses   Final diagnoses:  Lower abdominal pain     Discharge Instructions     Given the severity of your abdominal pain on physical exam, you will need further evaluation in the emergency department.  Please go to the emergency department to be evaluated further.    ED Prescriptions    None     PDMP not reviewed this encounter.   Chase Picket, MD 02/06/20 [facility]

## 2020-02-06 NOTE — Discharge Instructions (Signed)
Given the severity of your abdominal pain on physical exam, you will need further evaluation in the emergency department.  Please go to the emergency department to be evaluated further.

## 2020-02-06 NOTE — ED Notes (Signed)
Pt refused to wait.

## 2020-02-06 NOTE — ED Triage Notes (Signed)
Patient arrives to ED from UC with complaints of intermittent sharp lower back pain x1 month. Pt states she's also had generalized abdominal pain x1 week. States nausea recently.

## 2020-02-06 NOTE — ED Triage Notes (Addendum)
Pt c/o lower back pain x 4 weeks. She states this morning she began to have severe lower abd pain. She states this morning she could barely walk or sit up. She states she vomited once while waiting here and 2-3 days ago. Pt declines covid testing. She states she was just tested last week at her PCP for covid and flu.

## 2020-02-09 ENCOUNTER — Encounter (HOSPITAL_COMMUNITY): Payer: Self-pay | Admitting: Emergency Medicine

## 2020-02-09 ENCOUNTER — Emergency Department (HOSPITAL_COMMUNITY): Payer: Medicaid Other

## 2020-02-09 ENCOUNTER — Other Ambulatory Visit: Payer: Self-pay

## 2020-02-09 ENCOUNTER — Emergency Department (HOSPITAL_COMMUNITY)
Admission: EM | Admit: 2020-02-09 | Discharge: 2020-02-09 | Disposition: A | Discharge status: Home / Self Care | Payer: Medicaid Other | Attending: Emergency Medicine | Admitting: Emergency Medicine

## 2020-02-09 DIAGNOSIS — Z87891 Personal history of nicotine dependence: Secondary | ICD-10-CM | POA: Diagnosis not present

## 2020-02-09 DIAGNOSIS — K5900 Constipation, unspecified: Secondary | ICD-10-CM | POA: Insufficient documentation

## 2020-02-09 DIAGNOSIS — R1031 Right lower quadrant pain: Secondary | ICD-10-CM | POA: Diagnosis present

## 2020-02-09 DIAGNOSIS — J45909 Unspecified asthma, uncomplicated: Secondary | ICD-10-CM | POA: Insufficient documentation

## 2020-02-09 LAB — CBC
HCT: 37.7 % (ref 36.0–46.0)
Hemoglobin: 11.9 g/dL — ABNORMAL LOW (ref 12.0–15.0)
MCH: 27 pg (ref 26.0–34.0)
MCHC: 31.6 g/dL (ref 30.0–36.0)
MCV: 85.5 fL (ref 80.0–100.0)
Platelets: 266 10*3/uL (ref 150–400)
RBC: 4.41 MIL/uL (ref 3.87–5.11)
RDW: 12.5 % (ref 11.5–15.5)
WBC: 5.8 10*3/uL (ref 4.0–10.5)
nRBC: 0 % (ref 0.0–0.2)

## 2020-02-09 LAB — I-STAT BETA HCG BLOOD, ED (MC, WL, AP ONLY): I-stat hCG, quantitative: <5 m[IU]/mL (ref <5)

## 2020-02-09 LAB — COMPREHENSIVE METABOLIC PANEL
ALT: 12 U/L (ref 0–44)
AST: 17 U/L (ref 15–41)
Albumin: 3.7 g/dL (ref 3.5–5.0)
Alkaline Phosphatase: 57 U/L (ref 38–126)
Anion gap: 9 (ref 5–15)
BUN: 10 mg/dL (ref 6–20)
CO2: 23 mmol/L (ref 22–32)
Calcium: 9 mg/dL (ref 8.9–10.3)
Chloride: 107 mmol/L (ref 98–111)
Creatinine, Ser: 0.86 mg/dL (ref 0.44–1.00)
GFR, Estimated: >60 mL/min (ref >60)
Glucose, Bld: 91 mg/dL (ref 70–99)
Potassium: 3.8 mmol/L (ref 3.5–5.1)
Sodium: 139 mmol/L (ref 135–145)
Total Bilirubin: 0.7 mg/dL (ref 0.3–1.2)
Total Protein: 7.5 g/dL (ref 6.5–8.1)

## 2020-02-09 LAB — LIPASE, BLOOD: Lipase: 29 U/L (ref 11–51)

## 2020-02-09 MED ORDER — ONDANSETRON HCL 4 MG/2ML IJ SOLN
4.0000 mg | Freq: Once | INTRAMUSCULAR | Status: AC
  Administered 2020-02-09: 4 mg via INTRAVENOUS
  Filled 2020-02-09: qty 2

## 2020-02-09 MED ORDER — MORPHINE SULFATE (PF) 4 MG/ML IV SOLN
4.0000 mg | Freq: Once | INTRAVENOUS | Status: AC
  Administered 2020-02-09: 4 mg via INTRAVENOUS
  Filled 2020-02-09: qty 1

## 2020-02-09 MED ORDER — IOHEXOL 300 MG/ML  SOLN
100.0000 mL | Freq: Once | INTRAMUSCULAR | Status: AC | PRN
  Administered 2020-02-09: 100 mL via INTRAVENOUS

## 2020-02-09 NOTE — ED Provider Notes (Signed)
[facility] EMERGENCY DEPARTMENT Provider Note   CSN: [id number] Arrival date & time: 02/09/20  1117     History Chief Complaint  Patient presents with  . Abdominal Pain    Jamie [name] is a 29 y.o. female with past medical history significant for anemia, asthma, seizures, bipolar disorder. Surgical history includes appendectomy.  HPI Patient presents to emergency department with chief complaint of abdominal pain x 1 month.  Patient states her pain has been progressively worsening over the last x1 week.  She states her pain is located in her right lower quadrant and radiates to the left lower quadrant.  She states the pain is so bad sometimes that she doubles over.  She has been taking Advil at home without symptom improvement.  She rates the pain 10 out of 10 in severity.  She states the pain keeps her up at night.  She is also endorsing associated nausea, emesis, and subjective fever.  She had 3 episodes of nonbloody nonbilious emesis prior to arrival.  Her last bowel movement was this morning and was normal.  LMP was 01/15/2020.  She denies any chills, shortness of breath, chest pain, pelvic pain, vaginal discharge, abnormal vaginal bleeding, dyspareunia, dysuria, urinary frequency, gross hematuria. She is sexually active with 1 female partner.  They use protection and she states she had recent STD testing that was all negative.  She also recently had a negative Covid test. She went to UC  X 3 days ago and had negative pregnancy test and UA did not show infection.    Past Medical History:  Diagnosis Date  . Anemia   . Asthma   . Mental disorder    depression  . Seizure [facility])     Patient Active Problem List   Diagnosis Date Noted  . Mass of lower outer quadrant of left breast 11/25/2017  . RLQ abdominal pain 11/25/2017  . Missed period 11/25/2017  . History of ovarian cyst 11/25/2017  . Breast abscess 03/16/2017  . Pregnancy examination or test, negative  result 03/16/2017  . Encounter for gynecological examination with Papanicolaou smear of cervix 03/16/2017  . Patient desires pregnancy 03/16/2017  . Soreness breast 12/02/2016  . Nausea vomiting and diarrhea 11/05/2016  . Depression 11/05/2016  . Fatigue 11/05/2016  . Missed periods 11/05/2016  . Lower abdominal pain 11/05/2016  . Intractable vomiting with nausea 10/10/2016  . Appendicitis 01/13/2014  . Severe sepsis ([place]) 01/13/2014  . Intra-abdominal abscess ([place]) 01/13/2014    Past Surgical History:  Procedure Laterality Date  . APPENDECTOMY    . CESAREAN SECTION    . DRAINAGE TUBE REMOVAL ([place] HX)       OB History    Gravida  4   Para  1   Term  1   Preterm      AB  3   Living  1     SAB  3   TAB      Ectopic      Multiple      Live Births  1           Family History  Adopted: Yes  Family history unknown: Yes    Social History   Tobacco Use  . Smoking status: Former Smoker    Packs/day: 0.50    Years: 5.00    Pack years: 2.50    Types: Cigarettes    Quit date: 04/14/2012    Years since quitting: 7.8  . Smokeless tobacco: Never Used  Substance Use Topics  . Alcohol use: No  . Drug use: No    Home Medications Prior to Admission medications   Medication Sig Start Date End Date Taking? Authorizing Provider  albuterol (PROAIR HFA) 108 (90 Base) MCG/ACT inhaler Inhale 2 puffs into the lungs every 4 (four) hours as needed for wheezing or shortness of breath (or coughing). 11/24/19   Raylene Everts, MD  butalbital-acetaminophen-caffeine [facility]) [phone] MG tablet Take 1-2 tablets by mouth every 6 (six) hours as needed for headache. 11/24/19 11/23/20  Raylene Everts, MD  medroxyPROGESTERone (PROVERA) 10 MG tablet Take 1 tablet (10 mg total) by mouth in the morning, at noon, and at bedtime. 11/24/19   Raylene Everts, MD  zonisamide (ZONEGRAN) 100 MG capsule Take 3 capsules (300 mg total) by mouth 3 (three) times daily. 09/17/19 12/16/19   Truddie Hidden, MD  promethazine (PHENERGAN) 25 MG tablet Take 1 tablet (25 mg total) by mouth every 6 (six) hours as needed for nausea or vomiting. Patient not taking: Reported on 04/09/2018 08/11/17 12/11/18  Estill Dooms, NP    Allergies    Amoxicillin, Motrin [ibuprofen], and Penicillins  Review of Systems   Review of Systems  All other systems are reviewed and are negative for acute change except as noted in the HPI.   Physical Exam Updated Vital Signs BP 109/85   Pulse 80   Temp 98.7 F (37.1 C) (Oral)   Resp 18   Ht 5\' 5"  (1.651 m)   Wt 83.7 kg   LMP 01/15/2020   SpO2 100%   BMI 30.72 kg/m   Physical Exam Vitals and nursing note reviewed.  Constitutional:      General: She is not in acute distress.    Appearance: She is not ill-appearing.  HENT:     Head: Normocephalic and atraumatic.     Right Ear: Tympanic membrane and external ear normal.     Left Ear: Tympanic membrane and external ear normal.     Nose: Nose normal.     Mouth/Throat:     Mouth: Mucous membranes are moist.     Pharynx: Oropharynx is clear.  Eyes:     General: No scleral icterus.       Right eye: No discharge.        Left eye: No discharge.     Extraocular Movements: Extraocular movements intact.     Conjunctiva/sclera: Conjunctivae normal.     Pupils: Pupils are equal, round, and reactive to light.  Neck:     Vascular: No JVD.  Cardiovascular:     Rate and Rhythm: Normal rate and regular rhythm.     Pulses: Normal pulses.          Radial pulses are 2+ on the right side and 2+ on the left side.     Heart sounds: Normal heart sounds.  Pulmonary:     Comments: Lungs clear to auscultation in all fields. Symmetric chest rise. No wheezing, rales, or rhonchi. Abdominal:     Tenderness: There is abdominal tenderness in the right lower quadrant. There is no right CVA tenderness or left CVA tenderness.     Comments: Abdomen is soft, non-distended. No rigidity, no guarding. No  peritoneal signs.  Musculoskeletal:        General: Normal range of motion.     Cervical back: Normal range of motion.  Skin:    General: Skin is warm and dry.     Capillary Refill: Capillary refill takes  less than 2 seconds.  Neurological:     Mental Status: She is oriented to person, place, and time.     GCS: GCS eye subscore is 4. GCS verbal subscore is 5. GCS motor subscore is 6.     Comments: Fluent speech, no facial droop.  Psychiatric:        Behavior: Behavior normal.     ED Results / Procedures / Treatments   Labs (all labs ordered are listed, but only abnormal results are displayed) Labs Reviewed  CBC - Abnormal; Notable for the following components:      Result Value   Hemoglobin 11.9 (*)    All other components within normal limits  LIPASE, BLOOD  COMPREHENSIVE METABOLIC PANEL  I-STAT BETA HCG BLOOD, ED ([facility], [facility], [facility] ONLY)    EKG None  Radiology CT ABDOMEN PELVIS W CONTRAST  Result Date: 02/09/2020 CLINICAL DATA:  Right lower quadrant pain over the last month with nausea and vomiting. EXAM: CT ABDOMEN AND PELVIS WITH CONTRAST TECHNIQUE: Multidetector CT imaging of the abdomen and pelvis was performed using the standard protocol following bolus administration of intravenous contrast. CONTRAST:  135mL OMNIPAQUE IOHEXOL 300 MG/ML  SOLN COMPARISON:  03/12/2014 FINDINGS: Lower chest: Normal Hepatobiliary: Normal Pancreas: Normal Spleen: Normal Adrenals/Urinary Tract: Adrenal glands are normal. Kidneys are normal. Bladder is normal. Stomach/Bowel: Stomach and small intestine are normal. Previous appendectomy by history. Fairly large amount of gas and stool throughout the colon but no sign of acute colon inflammation. Vascular/Lymphatic: Normal Reproductive: Uterus is normal. Right ovary appears normal. Left ovary appears normal, though containing a 3.3 cm cyst, presumably a functional cyst. Other: No free fluid or air. Musculoskeletal: Normal IMPRESSION: 1. No cause of right  lower quadrant pain identified. Previous appendectomy by history. Fairly large amount of gas and stool throughout the colon but no sign of acute colon inflammation. 2. 3.3 cm cyst of the left ovary, presumably a functional cyst. Electronically Signed   By: Nelson Chimes M.D.   On: 02/09/2020 13:38    Procedures Procedures (including critical care time)  Medications Ordered in ED Medications  ondansetron (ZOFRAN) injection 4 mg (4 mg Intravenous Given 02/09/20 1245)  morphine 4 MG/ML injection 4 mg (4 mg Intravenous Given 02/09/20 1245)  iohexol (OMNIPAQUE) 300 MG/ML solution 100 mL (100 mLs Intravenous Contrast Given 02/09/20 1323)    ED Course  I have reviewed the triage vital signs and the nursing notes.  Pertinent labs & imaging results that were available during my care of the patient were reviewed by me and considered in my medical decision making (see chart for details).    MDM Rules/Calculators/A&P                          History provided by patient with additional history obtained from chart review.    Patient presents to the ED with complaints of abdominal pain. Patient nontoxic appearing, in no apparent distress, vitals WNL. On exam patient is tender to RLQ, no CVA tenderness, no peritoneal signs. Will evaluate with labs and CTA. Analgesics and anti-emetics administered.  Labs reviewed and grossly unremarkable. No leukocytosis, no anemia, no significant electrolyte derangements. LFTs, renal function, and lipase WNL. Pregnancy test is negative. Patient declines pelvic exam, std testing, and UA as she had them all recently and they were negative. CT AP shows large amount of gas and stool throughout colon without inflammation. Also has 2.3 cm cyst on left ovary. She has no  tenderness on LLQ, no indications for torsion. On repeat abdominal exam patient remains without peritoneal signs, doubt cholecystitis, pancreatitis, diverticulitis, bowel obstruction/perforation, PID, or ectopic  pregnancy. Patient tolerating PO in the emergency department. Will discharge home with supportive measures. Discussed constipation home regimen. I discussed results, treatment plan, need for PCP follow-up, and return precautions with the patient. Provided opportunity for questions, patient confirmed understanding and is in agreement with plan.    Portions of this note were generated with [employer]. Dictation errors may occur despite best attempts at proofreading.   Final Clinical Impression(s) / ED Diagnoses Final diagnoses:  Constipation, unspecified constipation type    Rx / DC Orders ED Discharge Orders    None       Cherre Robins, PA-C 02/09/20 [place], [place], DO 02/09/20 1807

## 2020-02-09 NOTE — Discharge Instructions (Signed)
To help reduce constipation and promote bowel health, 1. Drink at least 64 ounces of water each day; 2. Eat plenty of fiber (fruits, vegetables, whole grains, legumes) 3. Get plenty of physical activity  If needed, you may also use daily or as needed, MiraLax (Osmotic Laxative) up to  1-2 times a day and Colace (Stool Softener aka Docusate) 100 mg up to twice a day to help with bowel movements. These medications are over the counter.  MiraLax is an Osmotic You may use other over-the-counter medications such as Dulcolax, Fleet enemas, magnesium citrate as needed for constipation. Please note that some of these medications may cause you to have abdominal cramping which is normal. If you develop severe abdominal pain, fever, vomiting, distention of your abdomen, unable to have a bowel movement for 5 days or are not passing gas, please return to the hospital.  Return to the Emergency Department for any fever, worsening pain, blood in stool, severe abdominal pain, or any other worsening or concerning symptoms.

## 2020-02-09 NOTE — ED Notes (Signed)
Pt was d/c'd at approximately 1445

## 2020-02-09 NOTE — ED Triage Notes (Signed)
Pt c/o right lower abd pain for a month with nausea and vomiting

## 2020-03-14 ENCOUNTER — Ambulatory Visit: Payer: Medicaid Other | Admitting: Obstetrics and Gynecology

## 2020-04-11 ENCOUNTER — Telehealth: Payer: Self-pay | Admitting: Obstetrics and Gynecology

## 2020-04-11 DIAGNOSIS — N979 Female infertility, unspecified: Secondary | ICD-10-CM

## 2020-04-11 NOTE — Telephone Encounter (Signed)
Pt calling stating that her referral for [facility] has expired per that office and request to get new referral. thanks

## 2020-04-12 NOTE — Telephone Encounter (Signed)
I returned pt's call and discussed her request. Per chart review, pt's original referral was from 02/24/2019. She stated that she had not scheduled appointment because she did not have the money. I advised that I will place a new referral and that it will be active for one year. Pt voiced understanding and had no further questions.

## 2020-04-12 NOTE — Addendum Note (Signed)
Addended by: Jill Side on: 04/12/2020 05:19 PM   Modules accepted: Orders

## 2020-05-07 ENCOUNTER — Telehealth (INDEPENDENT_AMBULATORY_CARE_PROVIDER_SITE_OTHER): Payer: Medicaid Other | Admitting: Obstetrics and Gynecology

## 2020-05-07 ENCOUNTER — Encounter: Payer: Self-pay | Admitting: Obstetrics and Gynecology

## 2020-05-07 DIAGNOSIS — Z9189 Other specified personal risk factors, not elsewhere classified: Secondary | ICD-10-CM

## 2020-05-07 DIAGNOSIS — R1111 Vomiting without nausea: Secondary | ICD-10-CM | POA: Diagnosis not present

## 2020-05-07 DIAGNOSIS — F32A Depression, unspecified: Secondary | ICD-10-CM

## 2020-05-07 NOTE — Progress Notes (Signed)
GYNECOLOGY VIRTUAL VISIT ENCOUNTER NOTE  Provider location: [facility] [place] [facility]   I connected with Jamie [name] on 05/07/20 at  8:35 AM EST by MyChart Video Encounter at home and verified that I am speaking with the correct person using two identifiers.   I discussed the limitations, risks, security and privacy concerns of performing an evaluation and management service virtually and the availability of in person appointments. I also discussed with the patient that there may be a patient responsible charge related to this service. The patient expressed understanding and agreed to proceed.   History:  Jamie [name] is a 30 y.o. [phone] female who is being seen for follow up at her request. Last seen for abnormal and missed menses. Did the provera challenge, which was positive, and has had monthly periods since, not heavy and lasting 5 days each. Was supposed to see REI but has not.   She is sexually active and trying to achieve pregnancy. Last period was 04/13/20, was lighter than usual, last period was a month prior.  Having pain in her "uterus" when she coughs, and started vomiting every time she has eaten for the last week. Able to keep some things down, including water. Only coughing when she eats. No trouble swallowing, denies fever, chills, nausea. Only vomiting and coughing when she eats. Reports the smell makes her vomit and she is not having nausea.  She denies any abnormal vaginal discharge, bleeding, pelvic pain or other concerns.  Last tested for STI 02/2020.     Past Medical History:  Diagnosis Date  . Anemia   . Asthma   . Mental disorder    depression  . Seizure [facility])    Past Surgical History:  Procedure Laterality Date  . APPENDECTOMY    . CESAREAN SECTION    . DRAINAGE TUBE REMOVAL ([place] HX)     The following portions of the patient's history were reviewed and updated as appropriate: allergies, current medications, past  family history, past medical history, past social history, past surgical history and problem list.   Review of Systems:  Pertinent items noted in HPI and remainder of comprehensive ROS otherwise negative.  Physical Exam:   General:  Alert, oriented and cooperative. Patient appears to be in no acute distress.  Mental Status: Normal mood and affect. Normal behavior. Normal judgment and thought content.   Respiratory: Normal respiratory effort, no problems with respiration noted  Rest of physical exam deferred due to type of encounter  Labs and Imaging No results found for this or any previous visit (from the past 336 hour(s)). No results found.     Assessment and Plan:   1. Depression, unspecified depression type - Ambulatory referral to [facility]  2. Non-intractable vomiting without nausea, unspecified vomiting type - Take pregnancy test and COVID test - likely GI bug if both are negative - call if unable to keep down water, dehydrated or lasts > 2 weeks  3. At risk for fertility problems - has been referred to REI in past but did not go, will refer again if desires    I discussed the assessment and treatment plan with the patient. The patient was provided an opportunity to ask questions and all were answered. The patient agreed with the plan and demonstrated an understanding of the instructions.   The patient was advised to call back or seek an in-person evaluation/go to the ED if the symptoms worsen or if the  condition fails to improve as anticipated.  I provided 22 minutes of face-to-face time during this encounter.   Sloan Leiter, MD [facility] [place], [place]

## 2020-05-07 NOTE — Progress Notes (Signed)
I connected with  [name] on 05/07/20 at  8:35 AM EST by telephone and verified that I am speaking with the correct person using two identifiers.   I discussed the limitations, risks, security and privacy concerns of performing an evaluation and management service by telephone and the availability of in person appointments. I also discussed with the patient that there may be a patient responsible charge related to this service. The patient expressed understanding and agreed to proceed.  Georgia Lopes, RN 05/07/2020  8:45 AM   Pt does not have BP cuff at home. Pt states has therapy every Wed for Depression.

## 2020-05-29 ENCOUNTER — Telehealth: Payer: Self-pay | Admitting: Obstetrics and Gynecology

## 2020-05-29 NOTE — Telephone Encounter (Signed)
Returned patients call. Pt reports LMP 05/13/20. Patient reports spotting for 2 days over the weekend, spotting was light pink. Pt not taking birth control at this time. Has not taken UPT. Explained to pt that this is not concerning and there are multiple reasons she may experience spotting. Encouraged pt to take a home UPT if she is concerned for pregnancy, otherwise there is nothing that needs to be done.

## 2020-05-29 NOTE — Telephone Encounter (Signed)
Patient said she need someone to call her, she would not give me a reason

## 2020-05-30 NOTE — BH Specialist Note (Addendum)
Integrated Behavioral Health via Telemedicine Visit  05/30/2020 ANGLE DIRUSSO [id number]  Number of [place] visits: 1 Session Start time: 2:24  Session End time: 2:28 Total time: 4  Referring Provider: Jeryl Columbia Patient/Family location: [facility] Provider location: [facility] [place] [facility]  All persons participating in visit: Patient Taylon Louison and [place]   Types of Service: Introduction only  I connected with [name] and/or Ascencion Dike D [place]'s n/a by Telephone  (Video is Caregility application) and verified that I am speaking with the correct person using two identifiers.Discussed confidentiality: No; unable to hear pt well with loud background noise(many people in conversation)  Patient and/or legal guardian expressed understanding and consented to Telemedicine visit: Did not complete visit, as pt has therapist she sees weekly, and pt has no other concern to discuss today  Pt agrees to call back Roselyn Reef at [phone] if needed, and will continue to attend weekly therapy sessions.   Caroleen Hamman Terrie Haring, LCSW

## 2020-06-13 ENCOUNTER — Ambulatory Visit: Payer: Medicaid Other | Admitting: Clinical

## 2020-06-13 DIAGNOSIS — F32A Depression, unspecified: Secondary | ICD-10-CM

## 2020-07-07 ENCOUNTER — Encounter (HOSPITAL_COMMUNITY): Payer: Self-pay | Admitting: *Deleted

## 2020-07-07 ENCOUNTER — Ambulatory Visit (HOSPITAL_COMMUNITY)
Admission: EM | Admit: 2020-07-07 | Discharge: 2020-07-07 | Disposition: A | Discharge status: Home / Self Care | Payer: Medicaid Other | Attending: Family Medicine | Admitting: Family Medicine

## 2020-07-07 ENCOUNTER — Other Ambulatory Visit: Payer: Self-pay

## 2020-07-07 DIAGNOSIS — N946 Dysmenorrhea, unspecified: Secondary | ICD-10-CM

## 2020-07-07 DIAGNOSIS — N938 Other specified abnormal uterine and vaginal bleeding: Secondary | ICD-10-CM | POA: Diagnosis not present

## 2020-07-07 LAB — POCT URINALYSIS DIPSTICK, ED / UC
Glucose, UA: NEGATIVE mg/dL
Ketones, ur: 15 mg/dL — AB
Leukocytes,Ua: NEGATIVE
Nitrite: NEGATIVE
Protein, ur: >=300 mg/dL — AB
Specific Gravity, Urine: >=1.03 (ref 1.005–1.030)
Urobilinogen, UA: 2 mg/dL — ABNORMAL HIGH (ref 0.0–1.0)
pH: 5.5 (ref 5.0–8.0)

## 2020-07-07 LAB — POC URINE PREG, ED: Preg Test, Ur: NEGATIVE

## 2020-07-07 MED ORDER — ACETAMINOPHEN 500 MG PO TABS: 500.0000 mg | ORAL_TABLET | Freq: Four times a day (QID) | ORAL | 0 refills | Status: AC | PRN

## 2020-07-07 MED ORDER — MEDROXYPROGESTERONE ACETATE 10 MG PO TABS: 10.0000 mg | ORAL_TABLET | Freq: Three times a day (TID) | ORAL | 0 refills | Status: DC

## 2020-07-07 NOTE — ED Triage Notes (Signed)
Pt reports heavy menses since last night with lg. Clots.

## 2020-07-07 NOTE — Discharge Instructions (Signed)
Follow-up with OB/GYN as scheduled in 2 weeks

## 2020-07-07 NOTE — ED Provider Notes (Signed)
[facility]    CSN: [id number] Arrival date & time: 07/07/20  1311      History   Chief Complaint Chief Complaint  Patient presents with  . Vaginal Bleeding    HPI Jamie [name] is a 30 y.o. female.   Patient here today with 1 day history of significant heavy bleeding and clotting that she believes to be her menstrual cycle.  States her last menstrual cycle was sometime back in January, history of irregular cycles and has had one episode of heavy bleeding in the past similar to this where she was told she was anemic.  Is consistently bleeding through tampons and pads since onset yesterday.  Denies chest pain, shortness of breath, syncope but does feel lightheaded at times.  Not on any sort of contraception at this time.     Past Medical History:  Diagnosis Date  . Anemia   . Asthma   . Mental disorder    depression  . Seizure [facility])     Patient Active Problem List   Diagnosis Date Noted  . Mass of lower outer quadrant of left breast 11/25/2017  . RLQ abdominal pain 11/25/2017  . Missed period 11/25/2017  . History of ovarian cyst 11/25/2017  . Breast abscess 03/16/2017  . Pregnancy examination or test, negative result 03/16/2017  . Encounter for gynecological examination with Papanicolaou smear of cervix 03/16/2017  . Patient desires pregnancy 03/16/2017  . Soreness breast 12/02/2016  . Nausea vomiting and diarrhea 11/05/2016  . Depression 11/05/2016  . Fatigue 11/05/2016  . Missed periods 11/05/2016  . Lower abdominal pain 11/05/2016  . Intractable vomiting with nausea 10/10/2016  . Appendicitis 01/13/2014  . Severe sepsis ([place]) 01/13/2014  . Intra-abdominal abscess ([address]) 01/13/2014    Past Surgical History:  Procedure Laterality Date  . APPENDECTOMY    . CESAREAN SECTION    . DRAINAGE TUBE REMOVAL ([facility] HX)      OB History    Gravida  4   Para  1   Term  1   Preterm      AB  3   Living  1     SAB  3   IAB      Ectopic       Multiple      Live Births  1            Home Medications    Prior to Admission medications   Medication Sig Start Date End Date Taking? Authorizing Provider  acetaminophen (TYLENOL) 500 MG tablet Take 1 tablet (500 mg total) by mouth every 6 (six) hours as needed. 07/07/20  Yes Volney American, PA-C  albuterol [facility]) [phone] Base) MCG/ACT inhaler Inhale 2 puffs into the lungs every 4 (four) hours as needed for wheezing or shortness of breath (or coughing). 11/24/19   Raylene Everts, MD  medroxyPROGESTERone (PROVERA) 10 MG tablet Take 1 tablet (10 mg total) by mouth in the morning, at noon, and at bedtime. 07/07/20   Volney American, PA-C  zonisamide (ZONEGRAN) 100 MG capsule Take 3 capsules (300 mg total) by mouth 3 (three) times daily. 09/17/19 12/16/19  Truddie Hidden, MD  promethazine (PHENERGAN) 25 MG tablet Take 1 tablet (25 mg total) by mouth every 6 (six) hours as needed for nausea or vomiting. Patient not taking: Reported on 04/09/2018 08/11/17 12/11/18  Estill Dooms, NP    Family History Family History  Adopted: Yes  Family history unknown: Yes  Social History Social History   Tobacco Use  . Smoking status: Former Smoker    Packs/day: 0.50    Years: 5.00    Pack years: 2.50    Types: Cigarettes    Quit date: 04/14/2012    Years since quitting: 8.2  . Smokeless tobacco: Never Used  Substance Use Topics  . Alcohol use: No  . Drug use: No     Allergies   Amoxicillin, Motrin [ibuprofen], and Penicillins   Review of Systems Review of Systems Per HPI  Physical Exam Triage Vital Signs ED Triage Vitals  Enc Vitals Group     BP 07/07/20 1359 110/76     Pulse Rate 07/07/20 1354 70     Resp 07/07/20 1354 18     Temp 07/07/20 1354 98.6 F (37 C)     Temp Source 07/07/20 1354 Oral     SpO2 07/07/20 1354 100 %     Weight --      Height --      Head Circumference --      Peak Flow --      Pain Score 07/07/20 1353 10     Pain  Loc --      Pain Edu? --      Excl. in [place]? --    No data found.  Updated Vital Signs BP 110/76 (BP Location: Left Arm)   Pulse 70   Temp 98.6 F (37 C) (Oral)   Resp 18   LMP 07/07/2020   SpO2 100%   Visual Acuity Right Eye Distance:   Left Eye Distance:   Bilateral Distance:    Right Eye Near:   Left Eye Near:    Bilateral Near:     Physical Exam Vitals and nursing note reviewed.  Constitutional:      Appearance: Normal appearance. She is not ill-appearing.  HENT:     Head: Atraumatic.     Nose: Nose normal.     Mouth/Throat:     Mouth: Mucous membranes are moist.     Pharynx: Oropharynx is clear.  Eyes:     Extraocular Movements: Extraocular movements intact.     Conjunctiva/sclera: Conjunctivae normal.  Cardiovascular:     Rate and Rhythm: Normal rate and regular rhythm.     Heart sounds: Normal heart sounds.  Pulmonary:     Effort: Pulmonary effort is normal.     Breath sounds: Normal breath sounds.  Abdominal:     General: Bowel sounds are normal. There is no distension.     Palpations: Abdomen is soft.     Tenderness: There is abdominal tenderness. There is no right CVA tenderness, left CVA tenderness or guarding.     Comments: Mild suprapubic tenderness to palpation  Musculoskeletal:        General: Normal range of motion.     Cervical back: Normal range of motion and neck supple.  Skin:    General: Skin is warm and dry.  Neurological:     Mental Status: She is alert and oriented to person, place, and time.  Psychiatric:        Mood and Affect: Mood normal.        Thought Content: Thought content normal.        Judgment: Judgment normal.      UC Treatments / Results  Labs (all labs ordered are listed, but only abnormal results are displayed) Labs Reviewed  POCT URINALYSIS DIPSTICK, ED / UC - Abnormal; Notable for the following components:  Result Value   Bilirubin Urine SMALL (*)    Ketones, ur 15 (*)    Hgb urine dipstick LARGE (*)     Protein, ur >=300 (*)    Urobilinogen, UA 2.0 (*)    All other components within normal limits  POC URINE PREG, ED    EKG   Radiology No results found.  Procedures Procedures (including critical care time)  Medications Ordered in UC Medications - No data to display  Initial Impression / Assessment and Plan / UC Course  I have reviewed the triage vital signs and the nursing notes.  Pertinent labs & imaging results that were available during my care of the patient were reviewed by me and considered in my medical decision making (see chart for details).     Vitals and exam reassuring today, urine pregnancy negative, UA without evidence of urinary tract infection today.  She declines possibility of STDs today.  Has a OB/GYN appointment scheduled for April 4, in the meantime will start Provera to help lessen or stop the bleeding and Tylenol for the menstrual cramping.  Return for acutely worsening symptoms in the meantime.  Final Clinical Impressions(s) / UC Diagnoses   Final diagnoses:  Dysfunctional uterine bleeding  Menstrual cramps     Discharge Instructions     Follow-up with OB/GYN as scheduled in 2 weeks    ED Prescriptions    Medication Sig Dispense Auth. Provider   medroxyPROGESTERone (PROVERA) 10 MG tablet Take 1 tablet (10 mg total) by mouth in the morning, at noon, and at bedtime. 21 tablet Volney American, [place]   acetaminophen (TYLENOL) 500 MG tablet Take 1 tablet (500 mg total) by mouth every 6 (six) hours as needed. 30 tablet Volney American, [place]     PDMP not reviewed this encounter.   Volney American, [place] 07/07/20 1531

## 2020-07-13 ENCOUNTER — Telehealth: Payer: Self-pay | Admitting: Physician Assistant

## 2020-07-13 NOTE — Telephone Encounter (Signed)
Received a new hem referral from [place] for iron deficiency. Jamie [name] has been cld and scheduled to see Murray Hodgkins on 4/4 at 9am. Pt aware toa rrive 20 minutes early.

## 2020-07-15 NOTE — Progress Notes (Deleted)
[place] Telephone:(336) [phone]   Fax:(336) [place] NOTE  Patient Care Team: Patient, No Pcp Per (Inactive) as PCP - General (General Practice)  Hematological/Oncological History 1) Labs from [facility] -12/14/2019: WBC 6.2, Hgb 12.4, MCV 87, Plt 296 -06/11/2020: Ferritin 7.90 (L), Iron 45 (L), TIBC 536 (H), Vitamin B12 464, Folate 7.84, WBC 6.6, Hgh 11.8, MCV 84, Plt 259 -07/06/2020: WBC 5.7, Hgb 12.5, MCV 85, Plt 306  2) 07/16/2020: Establish care with Dede Query PA-C  CHIEF COMPLAINTS/PURPOSE OF CONSULTATION:  "Iron deficiency anemia "  HISTORY OF PRESENTING ILLNESS:  Jamie [name] 30 y.o. female with medical history significant for ***  On review of the previous records ***  On exam today ***  MEDICAL HISTORY:  Past Medical History:  Diagnosis Date  . Anemia   . Asthma   . Mental disorder    depression  . Seizure [facility])     SURGICAL HISTORY: Past Surgical History:  Procedure Laterality Date  . APPENDECTOMY    . CESAREAN SECTION    . DRAINAGE TUBE REMOVAL ([place] HX)      SOCIAL HISTORY: Social History   Socioeconomic History  . Marital status: Single    Spouse name: Not on file  . Number of children: Not on file  . Years of education: Not on file  . Highest education level: Not on file  Occupational History  . Not on file  Tobacco Use  . Smoking status: Former Smoker    Packs/day: 0.50    Years: 5.00    Pack years: 2.50    Types: Cigarettes    Quit date: 04/14/2012    Years since quitting: 8.2  . Smokeless tobacco: Never Used  Substance and Sexual Activity  . Alcohol use: No  . Drug use: No  . Sexual activity: Yes    Birth control/protection: None  Other Topics Concern  . Not on file  Social History Narrative  . Not on file   Social Determinants of Health   Financial Resource Strain: Not on file  Food Insecurity: Not on file  Transportation Needs: Not on file  Physical Activity: Not on file   Stress: Not on file  Social Connections: Not on file  Intimate Partner Violence: Not on file    FAMILY HISTORY: Family History  Adopted: Yes  Family history unknown: Yes    ALLERGIES:  is allergic to amoxicillin, motrin [ibuprofen], and penicillins.  MEDICATIONS:  Current Outpatient Medications  Medication Sig Dispense Refill  . acetaminophen (TYLENOL) 500 MG tablet Take 1 tablet (500 mg total) by mouth every 6 (six) hours as needed. 30 tablet 0  . albuterol (PROAIR HFA) 108 (90 Base) MCG/ACT inhaler Inhale 2 puffs into the lungs every 4 (four) hours as needed for wheezing or shortness of breath (or coughing). 18 g 0  . medroxyPROGESTERone (PROVERA) 10 MG tablet Take 1 tablet (10 mg total) by mouth in the morning, at noon, and at bedtime. 21 tablet 0  . zonisamide (ZONEGRAN) 100 MG capsule Take 3 capsules (300 mg total) by mouth 3 (three) times daily. 270 capsule 2   No current facility-administered medications for this visit.    REVIEW OF SYSTEMS:   Constitutional: ( - ) fevers, ( - )  chills , ( - ) night sweats Eyes: ( - ) blurriness of vision, ( - ) double vision, ( - ) watery eyes Ears, nose, mouth, throat, and face: ( - ) mucositis, ( - ) sore throat  Respiratory: ( - ) cough, ( - ) dyspnea, ( - ) wheezes Cardiovascular: ( - ) palpitation, ( - ) chest discomfort, ( - ) lower extremity swelling Gastrointestinal:  ( - ) nausea, ( - ) heartburn, ( - ) change in bowel habits Skin: ( - ) abnormal skin rashes Lymphatics: ( - ) new lymphadenopathy, ( - ) easy bruising Neurological: ( - ) numbness, ( - ) tingling, ( - ) new weaknesses Behavioral/Psych: ( - ) mood change, ( - ) new changes  All other systems were reviewed with the patient and are negative.  PHYSICAL EXAMINATION: ECOG PERFORMANCE STATUS: {CHL ONC ECOG [phone]}  There were no vitals filed for this visit. There were no vitals filed for this visit.  GENERAL: well appearing *** in NAD  SKIN: skin color,  texture, turgor are normal, no rashes or significant lesions EYES: conjunctiva are pink and non-injected, sclera clear OROPHARYNX: no exudate, no erythema; lips, buccal mucosa, and tongue normal  NECK: supple, non-tender LYMPH:  no palpable lymphadenopathy in the cervical, axillary or supraclavicular lymph nodes.  LUNGS: clear to auscultation and percussion with normal breathing effort HEART: regular rate & rhythm and no murmurs and no lower extremity edema ABDOMEN: soft, non-tender, non-distended, normal bowel sounds Musculoskeletal: no cyanosis of digits and no clubbing  PSYCH: alert & oriented x 3, fluent speech NEURO: no focal motor/sensory deficits  LABORATORY DATA:  I have reviewed the data as listed CBC Latest Ref Rng & Units 02/09/2020 02/06/2020 11/21/2019  WBC 4.0 - 10.5 K/uL 5.8 8.8 3.8(L)  Hemoglobin 12.0 - 15.0 g/dL 11.9(L) 11.8(L) 12.7  Hematocrit 36.0 - 46.0 % 37.7 37.6 40.1  Platelets 150 - 400 K/uL 266 264 221    CMP Latest Ref Rng & Units 02/09/2020 02/06/2020 11/21/2019  Glucose 70 - 99 mg/dL 91 142(H) 105(H)  BUN 6 - 20 mg/dL 10 10 6   Creatinine 0.44 - 1.00 mg/dL 0.86 0.85 1.02(H)  Sodium 135 - 145 mmol/L 139 138 140  Potassium 3.5 - 5.1 mmol/L 3.8 3.7 3.1(L)  Chloride 98 - 111 mmol/L 107 106 104  CO2 22 - 32 mmol/L 23 25 25   Calcium 8.9 - 10.3 mg/dL 9.0 9.2 9.0  Total Protein 6.5 - 8.1 g/dL 7.5 7.2 -  Total Bilirubin 0.3 - 1.2 mg/dL 0.7 0.8 -  Alkaline Phos 38 - 126 U/L 57 54 -  AST 15 - 41 U/L 17 17 -  ALT 0 - 44 U/L 12 13 -     PATHOLOGY: ***  BLOOD FILM: *** Review of the peripheral blood smear showed normal appearing white cells with neutrophils that were appropriately lobated and granulated. There was no predominance of bi-lobed or hyper-segmented neutrophils appreciated. No Dohle bodies were noted. There was no left shifting, immature forms or blasts noted. Lymphocytes remain normal in size without any predominance of large granular lymphocytes. Red  cells show no anisopoikilocytosis, macrocytes , microcytes or polychromasia. There were no schistocytes, target cells, echinocytes, acanthocytes, dacrocytes, or stomatocytes.There was no rouleaux formation, nucleated red cells, or intra-cellular inclusions noted. The platelets are normal in size, shape, and color without any clumping evident.  RADIOGRAPHIC STUDIES: I have personally reviewed the radiological images as listed and agreed with the findings in the report. No results found.  ASSESSMENT & PLAN ***  No orders of the defined types were placed in this encounter.   All questions were answered. The patient knows to call the clinic with any problems, questions or concerns.  A total  of more than {CHL ONC TIME VISIT - HDIXB:[id number]} were spent on this encounter and over half of that time was spent on counseling and coordination of care as outlined above.    Dede Query, PA-C Department of Hematology/Oncology [place] at [facility] Phone: [phone]

## 2020-07-16 ENCOUNTER — Encounter: Payer: Medicaid Other | Admitting: Physician Assistant

## 2020-07-16 ENCOUNTER — Telehealth: Payer: Self-pay

## 2020-07-16 ENCOUNTER — Other Ambulatory Visit: Payer: Medicaid Other

## 2020-07-16 NOTE — Telephone Encounter (Signed)
I have called this pt to determine if she planned to still come to her 9am appt. I left a message advising to call us back if she is wanting to come or needs to reschedule.

## 2020-07-19 ENCOUNTER — Encounter: Payer: Self-pay | Admitting: *Deleted

## 2020-10-03 ENCOUNTER — Encounter (HOSPITAL_COMMUNITY): Payer: Self-pay

## 2020-10-03 ENCOUNTER — Ambulatory Visit (HOSPITAL_COMMUNITY)
Admission: EM | Admit: 2020-10-03 | Discharge: 2020-10-03 | Disposition: A | Discharge status: Home / Self Care | Payer: Medicaid Other

## 2020-10-03 DIAGNOSIS — M5442 Lumbago with sciatica, left side: Secondary | ICD-10-CM | POA: Diagnosis not present

## 2020-10-03 DIAGNOSIS — M62830 Muscle spasm of back: Secondary | ICD-10-CM

## 2020-10-03 HISTORY — DX: Type 2 diabetes mellitus without complications: E11.9

## 2020-10-03 LAB — POCT URINALYSIS DIPSTICK, ED / UC
Glucose, UA: NEGATIVE mg/dL
Hgb urine dipstick: NEGATIVE
Ketones, ur: NEGATIVE mg/dL
Leukocytes,Ua: NEGATIVE
Nitrite: NEGATIVE
Protein, ur: NEGATIVE mg/dL
Specific Gravity, Urine: >=1.03 (ref 1.005–1.030)
Urobilinogen, UA: 1 mg/dL (ref 0.0–1.0)
pH: 5.5 (ref 5.0–8.0)

## 2020-10-03 LAB — POC URINE PREG, ED: Preg Test, Ur: NEGATIVE

## 2020-10-03 MED ORDER — KETOROLAC TROMETHAMINE 30 MG/ML IJ SOLN
INTRAMUSCULAR | Status: AC
  Filled 2020-10-03: qty 1

## 2020-10-03 MED ORDER — KETOROLAC TROMETHAMINE 30 MG/ML IJ SOLN
30.0000 mg | Freq: Once | INTRAMUSCULAR | Status: AC
  Administered 2020-10-03: 19:00:00 30 mg via INTRAMUSCULAR

## 2020-10-03 NOTE — ED Triage Notes (Addendum)
Pt c/o pain in back for 4-5 days. 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 Reports pain radiates around to stomach and down left leg. Pt reports pain is worse when sitting up, laying down, and that when pressure is put on back it makes her leg kick out.  Pt requests blood sugar check as well as she has not checked in awhile. Pt states has had sweats recently and wonders if due to blood sugar. Also reports bumps on face and states may be heat rash.

## 2020-10-03 NOTE — Discharge Instructions (Addendum)
Advise stretching Apply ice to affected area Take Ibuprofen as needed for pain  Can take Flexeril for muscle spasm as needed If no improvement or symptoms worsen return for evaluation

## 2020-10-03 NOTE — ED Provider Notes (Addendum)
[facility]    CSN: [id number] Arrival date & time: 10/03/20  1712      History   Chief Complaint Chief Complaint  Patient presents with   Back Pain   Abdominal Pain   Leg Pain   blood sugar check    HPI Jamie [name] is a 30 y.o. female.   Pt complains of lower and mid back pain that started several days ago.  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  She reports radiation to the left leg, reports the entire left leg. States that sometimes the leg gives out.  Denies loss of bowel or bladder control, denies urinary sx, fever, chills.  She is taking nothing for the sx.  She denies previous problems with back pain.    Past Medical History:  Diagnosis Date   Anemia    Asthma    Diabetes mellitus without complication ([place])    per pt report type 1 diabetic   Mental disorder    depression   Seizure [facility])     Patient Active Problem List   Diagnosis Date Noted   Mass of lower outer quadrant of left breast 11/25/2017   RLQ abdominal pain 11/25/2017   Missed period 11/25/2017   History of ovarian cyst 11/25/2017   Breast abscess 03/16/2017   Pregnancy examination or test, negative result 03/16/2017   Encounter for gynecological examination with Papanicolaou smear of cervix 03/16/2017   Patient desires pregnancy 03/16/2017   Soreness breast 12/02/2016   Nausea vomiting and diarrhea 11/05/2016   Depression 11/05/2016   Fatigue 11/05/2016   Missed periods 11/05/2016   Lower abdominal pain 11/05/2016   Intractable vomiting with nausea 10/10/2016   Appendicitis 01/13/2014   Severe sepsis ([place]) 01/13/2014   Intra-abdominal abscess ([place]) 01/13/2014    Past Surgical History:  Procedure Laterality Date   APPENDECTOMY     CESAREAN SECTION     DRAINAGE TUBE REMOVAL ([place] HX)      OB History     Gravida  4   Para  1   Term  1   Preterm      AB  3   Living  1      SAB   3   IAB      Ectopic      Multiple      Live Births  1            Home Medications    Prior to Admission medications   Medication Sig Start Date End Date Taking? Authorizing Provider  acetaminophen (TYLENOL) 500 MG tablet Take 1 tablet (500 mg total) by mouth every 6 (six) hours as needed. 07/07/20  Yes Volney American, PA-C  albuterol [facility] [phone] Base) MCG/ACT inhaler Inhale 2 puffs into the lungs every 4 (four) hours as needed for wheezing or shortness of breath (or coughing). 11/24/19  Yes Raylene Everts, MD  levETIRAcetam (KEPPRA) 500 MG tablet Take 500 mg by mouth in the morning, at noon, and at bedtime.   Yes [provider]  zonisamide (ZONEGRAN) 100 MG capsule Take 3 capsules (300 mg total) by mouth 3 (three) times daily. 09/17/19 10/03/20 Yes Truddie Hidden, MD  medroxyPROGESTERone (PROVERA) 10 MG tablet Take 1 tablet (10 mg total) by mouth in the morning, at noon, and at bedtime. 07/07/20   Volney American, PA-C  promethazine (PHENERGAN) 25 MG tablet  Take 1 tablet (25 mg total) by mouth every 6 (six) hours as needed for nausea or vomiting. Patient not taking: Reported on 04/09/2018 08/11/17 12/11/18  Estill Dooms, NP    Family History Family History  Adopted: Yes  Family history unknown: Yes    Social History Social History   Tobacco Use   Smoking status: Former    Packs/day: 0.50    Years: 5.00    Pack years: 2.50    Types: Cigarettes    Quit date: 04/14/2012    Years since quitting: 8.4   Smokeless tobacco: Never  Substance Use Topics   Alcohol use: No   Drug use: No     Allergies   Amoxicillin, Motrin [ibuprofen], and Penicillins   Review of Systems Review of Systems  Constitutional:  Negative for chills and fever.  HENT:  Negative for ear pain and sore throat.   Eyes:  Negative for pain and visual disturbance.  Respiratory:  Negative for cough and shortness of breath.   Cardiovascular:  Negative for  chest pain and palpitations.  Gastrointestinal:  Negative for abdominal pain and vomiting.  Genitourinary:  Negative for dysuria and hematuria.  Musculoskeletal:  Positive for back pain. Negative for arthralgias and gait problem.  Skin:  Negative for color change and rash.  Neurological:  Negative for seizures and syncope.  All other systems reviewed and are negative.   Physical Exam Triage Vital Signs ED Triage Vitals  Enc Vitals Group     BP 10/03/20 1829 109/65     Pulse Rate 10/03/20 1827 70     Resp 10/03/20 1827 18     Temp 10/03/20 1827 98.6 F (37 C)     Temp src --      SpO2 10/03/20 1827 100 %     Weight --      Height --      Head Circumference --      Peak Flow --      Pain Score 10/03/20 1821 10     Pain Loc --      Pain Edu? --      Excl. in [place]? --    No data found.  Updated Vital Signs BP 109/65   Pulse 70   Temp 98.6 F (37 C)   Resp 18   LMP  (LMP Unknown) Comment: pt does not know day of last period  SpO2 100%   Visual Acuity Right Eye Distance:   Left Eye Distance:   Bilateral Distance:    Right Eye Near:   Left Eye Near:    Bilateral Near:     Physical Exam Vitals and nursing note reviewed.  Constitutional:      General: She is not in acute distress.    Appearance: She is well-developed.  HENT:     Head: Normocephalic and atraumatic.  Eyes:     Conjunctiva/sclera: Conjunctivae normal.  Cardiovascular:     Rate and Rhythm: Normal rate and regular rhythm.     Heart sounds: No murmur heard. Pulmonary:     Effort: Pulmonary effort is normal. No respiratory distress.     Breath sounds: Normal breath sounds.  Abdominal:     Palpations: Abdomen is soft.     Tenderness: There is no abdominal tenderness.  Musculoskeletal:     Cervical back: Normal and neck supple.     Thoracic back: Normal.     Lumbar back: Tenderness (diffuse tenderness) present. No spasms. Negative right straight leg raise test and  negative left straight leg raise  test.  Skin:    General: Skin is warm and dry.  Neurological:     Mental Status: She is alert.     UC Treatments / Results  Labs (all labs ordered are listed, but only abnormal results are displayed) Labs Reviewed  POCT URINALYSIS DIPSTICK, ED / UC - Abnormal; Notable for the following components:      Result Value   Bilirubin Urine SMALL (*)    All other components within normal limits  POC URINE PREG, ED    EKG   Radiology No results found.  Procedures Procedures (including critical care time)  Medications Ordered in UC Medications  ketorolac (TORADOL) 30 MG/ML injection 30 mg (has no administration in time range)    Initial Impression / Assessment and Plan / UC Course  I have reviewed the triage vital signs and the nursing notes.  Pertinent labs & imaging results that were available during my care of the patient were reviewed by me and considered in my medical decision making (see chart for details).     Will treat muscle spasm.  UA normal, urine preg negative. Toradol given in clinic today.  Flexeril prescribed.  Advise stretching and light walking.  Can take Ibuprofen as needed.   Final Clinical Impressions(s) / UC Diagnoses   Final diagnoses:  Back spasm  Acute low back pain with left-sided sciatica, unspecified back pain laterality   Discharge Instructions   None    ED Prescriptions   None    PDMP not reviewed this encounter.   Konrad Felix, PA-C 10/03/20 1900    Konrad Felix, PA-C 10/03/20 1916

## 2020-11-12 ENCOUNTER — Emergency Department (HOSPITAL_COMMUNITY): Payer: Medicaid Other

## 2020-11-12 ENCOUNTER — Other Ambulatory Visit: Payer: Self-pay

## 2020-11-12 ENCOUNTER — Encounter (HOSPITAL_COMMUNITY): Payer: Self-pay

## 2020-11-12 ENCOUNTER — Emergency Department (HOSPITAL_COMMUNITY)
Admission: EM | Admit: 2020-11-12 | Discharge: 2020-11-12 | Disposition: A | Discharge status: Home / Self Care | Payer: Medicaid Other | Attending: Emergency Medicine | Admitting: Emergency Medicine

## 2020-11-12 DIAGNOSIS — Z87891 Personal history of nicotine dependence: Secondary | ICD-10-CM | POA: Diagnosis not present

## 2020-11-12 DIAGNOSIS — R059 Cough, unspecified: Secondary | ICD-10-CM

## 2020-11-12 DIAGNOSIS — Z20822 Contact with and (suspected) exposure to covid-19: Secondary | ICD-10-CM | POA: Diagnosis not present

## 2020-11-12 DIAGNOSIS — R531 Weakness: Secondary | ICD-10-CM | POA: Diagnosis not present

## 2020-11-12 DIAGNOSIS — M791 Myalgia, unspecified site: Secondary | ICD-10-CM | POA: Insufficient documentation

## 2020-11-12 DIAGNOSIS — J45909 Unspecified asthma, uncomplicated: Secondary | ICD-10-CM | POA: Diagnosis not present

## 2020-11-12 DIAGNOSIS — E119 Type 2 diabetes mellitus without complications: Secondary | ICD-10-CM | POA: Diagnosis not present

## 2020-11-12 DIAGNOSIS — J069 Acute upper respiratory infection, unspecified: Secondary | ICD-10-CM | POA: Insufficient documentation

## 2020-11-12 LAB — RESP PANEL BY RT-PCR (FLU A&B, COVID) ARPGX2
Influenza A by PCR: NEGATIVE
Influenza B by PCR: NEGATIVE
SARS Coronavirus 2 by RT PCR: NEGATIVE

## 2020-11-12 MED ORDER — ALBUTEROL SULFATE HFA 108 (90 BASE) MCG/ACT IN AERS
2.0000 | INHALATION_SPRAY | Freq: Once | RESPIRATORY_TRACT | Status: AC
  Administered 2020-11-12: 2 via RESPIRATORY_TRACT
  Filled 2020-11-12: qty 6.7

## 2020-11-12 MED ORDER — AEROCHAMBER Z-STAT PLUS/MEDIUM MISC
1.0000 | Freq: Once | Status: AC
  Administered 2020-11-12: 1
  Filled 2020-11-12: qty 1

## 2020-11-12 MED ORDER — BENZONATATE 100 MG PO CAPS: 100.0000 mg | ORAL_CAPSULE | Freq: Three times a day (TID) | ORAL | 0 refills | Status: AC

## 2020-11-12 MED ORDER — ACETAMINOPHEN 325 MG PO TABS
650.0000 mg | ORAL_TABLET | Freq: Once | ORAL | Status: AC
  Administered 2020-11-12: 650 mg via ORAL
  Filled 2020-11-12: qty 2

## 2020-11-12 NOTE — ED Triage Notes (Signed)
Patient c/o bilateral leg pain x 2 weeks. Patient denies any injury. Patient c/o a non productive cough, hoarseness, and chest congestion x 2 weeks.

## 2020-11-12 NOTE — Discharge Instructions (Signed)
Take tessalon as directed. Use two puffs of the albuterol inhaler as needed for cough or wheezing.   Rotate tylenol and motrin for pain  Please follow up with your primary care provider within 5-7 days for re-evaluation of your symptoms. If you do not have a primary care provider, information for a healthcare clinic has been provided for you to make arrangements for follow up care. Please return to the emergency department for any new or worsening symptoms.

## 2020-11-12 NOTE — ED Provider Notes (Signed)
[facility] DEPT Provider Note   CSN: [id number] Arrival date & time: 11/12/20  0910     History Chief Complaint  Patient presents with   Leg Pain   Cough    Jamie [name] is a 30 y.o. female.  HPI   Pt is a 29 y/o female with a h/o anemia, asthma, DM, depression, seizures who presents to the ED today for eval of cough and shortness of breath for 2 weeks. She has some chest pain when she coughs as well. She further reports body aches and generalized weakness as well. Denies nvd.   Past Medical History:  Diagnosis Date   Anemia    Asthma    Diabetes mellitus without complication ([place])    per pt report type 1 diabetic   Mental disorder    depression   [facility]  [facility]     Patient Active Problem List   Diagnosis Date Noted   Mass of lower outer quadrant of left breast 11/25/2017   RLQ abdominal pain 11/25/2017   Missed period 11/25/2017   History of ovarian cyst 11/25/2017   Breast abscess 03/16/2017   Pregnancy examination or test, negative result 03/16/2017   Encounter for gynecological examination with Papanicolaou smear of cervix 03/16/2017   Patient desires pregnancy 03/16/2017   Soreness breast 12/02/2016   Nausea vomiting and diarrhea 11/05/2016   Depression 11/05/2016   Fatigue 11/05/2016   Missed periods 11/05/2016   Lower abdominal pain 11/05/2016   Intractable vomiting with nausea 10/10/2016   Appendicitis 01/13/2014   Severe sepsis ([place]) 01/13/2014   Intra-abdominal abscess ([place]) 01/13/2014    Past Surgical History:  Procedure Laterality Date   APPENDECTOMY     CESAREAN SECTION     DRAINAGE TUBE REMOVAL ([place] HX)       OB History     Gravida  4   Para  1   Term  1   Preterm      AB  3   Living  1      SAB  3   IAB      Ectopic      Multiple      Live Births  1           Family History  Adopted: Yes  Family history unknown: Yes    Social History   Tobacco Use   Smoking status:  Former    Packs/day: 0.50    Years: 5.00    Pack years: 2.50    Types: Cigarettes    Quit date: 04/14/2012    Years since quitting: 8.5   Smokeless tobacco: Never  Vaping Use   Vaping Use: Never used  Substance Use Topics   Alcohol use: No   Drug use: No    Home Medications Prior to Admission medications   Medication Sig Start Date End Date Taking? Authorizing Provider  benzonatate (TESSALON) 100 MG capsule Take 1 capsule (100 mg total) by mouth every 8 (eight) hours for 5 days. 11/12/20 11/17/20 Yes Mychael Smock S, PA-C  acetaminophen (TYLENOL) 500 MG tablet Take 1 tablet (500 mg total) by mouth every 6 (six) hours as needed. 07/07/20   Volney American, PA-C  albuterol [facility]) 108 [phone] Base) MCG/ACT inhaler Inhale 2 puffs into the lungs every 4 (four) hours as needed for wheezing or shortness of breath (or coughing). 11/24/19   Raylene Everts, MD  levETIRAcetam (KEPPRA) 500 MG tablet Take 500 mg by mouth in the morning, at  noon, and at bedtime.    [provider]  medroxyPROGESTERone (PROVERA) 10 MG tablet Take 1 tablet (10 mg total) by mouth in the morning, at noon, and at bedtime. 07/07/20   Volney American, PA-C  zonisamide (ZONEGRAN) 100 MG capsule Take 3 capsules (300 mg total) by mouth 3 (three) times daily. 09/17/19 10/03/20  Truddie Hidden, MD  promethazine (PHENERGAN) 25 MG tablet Take 1 tablet (25 mg total) by mouth every 6 (six) hours as needed for nausea or vomiting. Patient not taking: Reported on 04/09/2018 08/11/17 12/11/18  Estill Dooms, NP    Allergies    Amoxicillin, Motrin [ibuprofen], and Penicillins  Review of Systems   Review of Systems  Constitutional:  Positive for chills, diaphoresis and fever (subjective).  HENT:  Negative for ear pain and sore throat.   Eyes:  Negative for visual disturbance.  Respiratory:  Positive for cough and shortness of breath.   Cardiovascular:  Negative for chest pain.  Gastrointestinal:   Negative for abdominal pain, constipation, diarrhea, nausea and vomiting.  Genitourinary:  Negative for dysuria and hematuria.  Musculoskeletal:  Positive for myalgias. Negative for back pain.  Skin:  Negative for color change and rash.  Neurological:  Negative for seizures and syncope.  All other systems reviewed and are negative.  Physical Exam Updated Vital Signs BP 104/72   Pulse 80   Temp 99 F (37.2 C) (Oral)   Resp 16   Ht 5' 5.5" (1.664 m)   Wt 88.5 kg   LMP 10/09/2020 (Approximate)   SpO2 100%   BMI 31.96 kg/m   Physical Exam Vitals and nursing note reviewed.  Constitutional:      General: She is not in acute distress.    Appearance: She is well-developed.  HENT:     Head: Normocephalic and atraumatic.  Eyes:     Conjunctiva/sclera: Conjunctivae normal.  Cardiovascular:     Rate and Rhythm: Normal rate and regular rhythm.     Heart sounds: No murmur heard. Pulmonary:     Effort: Pulmonary effort is normal. No respiratory distress.     Breath sounds: Normal breath sounds.  Abdominal:     Palpations: Abdomen is soft.     Tenderness: There is no abdominal tenderness.  Musculoskeletal:     Cervical back: Neck supple.  Skin:    General: Skin is warm and dry.  Neurological:     Mental Status: She is alert.    ED Results / Procedures / Treatments   Labs (all labs ordered are listed, but only abnormal results are displayed) Labs Reviewed  RESP PANEL BY RT-PCR (FLU A&B, COVID) [id number]    EKG None  Radiology DG Chest Port 1 View  Result Date: 11/12/2020 CLINICAL DATA:  30 year old female with bilateral leg pain EXAM: PORTABLE CHEST 1 VIEW COMPARISON:  Multiple chest radiograph, most recently 12/11/2018 FINDINGS: Cardiomediastinal silhouette is within normal limits. Normal lung inflation. No focal consolidation or mass. No pleural effusion or pneumothorax. No acute osseous abnormality IMPRESSION: Normal chest. Electronically Signed   By: Michaelle Birks MD   On:  11/12/2020 14:45    Procedures Procedures   Medications Ordered in ED Medications  acetaminophen (TYLENOL) tablet 650 mg (650 mg Oral Given 11/12/20 1410)  albuterol (VENTOLIN HFA) 108 (90 Base) MCG/ACT inhaler 2 puff (2 puffs Inhalation Given 11/12/20 1410)  aerochamber Z-Stat Plus/medium 1 each (1 each Other Given 11/12/20 1410)    ED Course  I have reviewed the triage vital signs  and the nursing notes.  Pertinent labs & imaging results that were available during my care of the patient were reviewed by me and considered in my medical decision making (see chart for details).    MDM Rules/Calculators/A&P                          30 year old female present to the emergency department today for evaluation of a cough. She is also been having body aches for the last two weeks as well. I have any vomiting, diarrhea, abdominal pain. She is well appearing on exam. Lungs are clear to auscultation bilaterally. She feels like she is wheezing so is given an albuterol inhaler. Chest x-ray was completed which did not show any evidence of pneumonia. Cobra test was also obtained and is pending at the time of the patient's discharge. Given she is so well appearing, suspect that she likely has a viral URI causing her upper respiratory symptoms. Will treat symptomatically. Will have her follow up with PCP and return to the ED for any new or worsening symptoms. She voices understanding the plan and reasons to return. All questions answered. Patient stable for discharge.   Final Clinical Impression(s) / ED Diagnoses Final diagnoses:  Upper respiratory tract infection, unspecified type  Person under investigation for COVID-19    Rx / DC Orders ED Discharge Orders          Ordered    benzonatate (TESSALON) 100 MG capsule  Every 8 hours        11/12/20 1548             Lilyahna Sirmon S, PA-C 11/12/20 1549    [name], MD 11/12/20 1818

## 2020-12-01 ENCOUNTER — Emergency Department (HOSPITAL_COMMUNITY)
Admission: EM | Admit: 2020-12-01 | Discharge: 2020-12-02 | Disposition: A | Discharge status: Home / Self Care | Payer: Medicaid Other | Attending: Emergency Medicine | Admitting: Emergency Medicine

## 2020-12-01 ENCOUNTER — Emergency Department (HOSPITAL_COMMUNITY): Payer: Medicaid Other

## 2020-12-01 ENCOUNTER — Other Ambulatory Visit: Payer: Self-pay

## 2020-12-01 ENCOUNTER — Encounter (HOSPITAL_COMMUNITY): Payer: Self-pay

## 2020-12-01 DIAGNOSIS — Z87891 Personal history of nicotine dependence: Secondary | ICD-10-CM | POA: Insufficient documentation

## 2020-12-01 DIAGNOSIS — E109 Type 1 diabetes mellitus without complications: Secondary | ICD-10-CM | POA: Diagnosis not present

## 2020-12-01 DIAGNOSIS — N939 Abnormal uterine and vaginal bleeding, unspecified: Secondary | ICD-10-CM | POA: Diagnosis present

## 2020-12-01 DIAGNOSIS — N938 Other specified abnormal uterine and vaginal bleeding: Secondary | ICD-10-CM | POA: Diagnosis not present

## 2020-12-01 DIAGNOSIS — R102 Pelvic and perineal pain: Secondary | ICD-10-CM | POA: Diagnosis not present

## 2020-12-01 DIAGNOSIS — J45909 Unspecified asthma, uncomplicated: Secondary | ICD-10-CM | POA: Insufficient documentation

## 2020-12-01 LAB — CBC WITH DIFFERENTIAL/PLATELET
Abs Immature Granulocytes: 0.01 10*3/uL (ref 0.00–0.07)
Basophils Absolute: 0.1 10*3/uL (ref 0.0–0.1)
Basophils Relative: 1 %
Eosinophils Absolute: 0.2 10*3/uL (ref 0.0–0.5)
Eosinophils Relative: 3 %
HCT: 38.8 % (ref 36.0–46.0)
Hemoglobin: 12.5 g/dL (ref 12.0–15.0)
Immature Granulocytes: 0 %
Lymphocytes Relative: 32 %
Lymphs Abs: 2.2 10*3/uL (ref 0.7–4.0)
MCH: 27.2 pg (ref 26.0–34.0)
MCHC: 32.2 g/dL (ref 30.0–36.0)
MCV: 84.5 fL (ref 80.0–100.0)
Monocytes Absolute: 0.4 10*3/uL (ref 0.1–1.0)
Monocytes Relative: 6 %
Neutro Abs: 3.9 10*3/uL (ref 1.7–7.7)
Neutrophils Relative %: 58 %
Platelets: 328 10*3/uL (ref 150–400)
RBC: 4.59 MIL/uL (ref 3.87–5.11)
RDW: 13.2 % (ref 11.5–15.5)
WBC: 6.8 10*3/uL (ref 4.0–10.5)
nRBC: 0 % (ref 0.0–0.2)

## 2020-12-01 LAB — COMPREHENSIVE METABOLIC PANEL
ALT: 13 U/L (ref 0–44)
AST: 16 U/L (ref 15–41)
Albumin: 4 g/dL (ref 3.5–5.0)
Alkaline Phosphatase: 66 U/L (ref 38–126)
Anion gap: 9 (ref 5–15)
BUN: 10 mg/dL (ref 6–20)
CO2: 25 mmol/L (ref 22–32)
Calcium: 9.1 mg/dL (ref 8.9–10.3)
Chloride: 107 mmol/L (ref 98–111)
Creatinine, Ser: 0.97 mg/dL (ref 0.44–1.00)
GFR, Estimated: >60 mL/min (ref >60)
Glucose, Bld: 110 mg/dL — ABNORMAL HIGH (ref 70–99)
Potassium: 4.1 mmol/L (ref 3.5–5.1)
Sodium: 141 mmol/L (ref 135–145)
Total Bilirubin: 0.5 mg/dL (ref 0.3–1.2)
Total Protein: 8 g/dL (ref 6.5–8.1)

## 2020-12-01 LAB — URINALYSIS, ROUTINE W REFLEX MICROSCOPIC
Bacteria, UA: NONE SEEN
Bilirubin Urine: NEGATIVE
Glucose, UA: NEGATIVE mg/dL
Ketones, ur: NEGATIVE mg/dL
Leukocytes,Ua: NEGATIVE
Nitrite: NEGATIVE
Protein, ur: 100 mg/dL — AB
RBC / HPF: >50 RBC/hpf — ABNORMAL HIGH (ref 0–5)
WBC, UA: >50 WBC/hpf — ABNORMAL HIGH (ref 0–5)

## 2020-12-01 LAB — LIPASE, BLOOD: Lipase: 28 U/L (ref 11–51)

## 2020-12-01 LAB — I-STAT BETA HCG BLOOD, ED (MC, WL, AP ONLY): I-stat hCG, quantitative: <5 m[IU]/mL (ref <5)

## 2020-12-01 LAB — TYPE AND SCREEN
ABO/RH(D): B POS
Antibody Screen: NEGATIVE

## 2020-12-01 MED ORDER — ACETAMINOPHEN 500 MG PO TABS
1000.0000 mg | ORAL_TABLET | Freq: Once | ORAL | Status: AC
  Administered 2020-12-02: 1000 mg via ORAL
  Filled 2020-12-01: qty 2

## 2020-12-01 MED ORDER — MEDROXYPROGESTERONE ACETATE 10 MG PO TABS: 10.0000 mg | ORAL_TABLET | Freq: Three times a day (TID) | ORAL | 0 refills | Status: DC

## 2020-12-01 MED ORDER — ACETAMINOPHEN 500 MG PO TABS
1000.0000 mg | ORAL_TABLET | Freq: Once | ORAL | Status: AC
  Administered 2020-12-01: 1000 mg via ORAL
  Filled 2020-12-01: qty 2

## 2020-12-01 NOTE — ED Provider Notes (Signed)
[facility] DEPT Provider Note   CSN: [id number] Arrival date & time: 12/01/20  1318     History Chief Complaint  Patient presents with   Vaginal Bleeding    Jamie [name] is a 30 y.o. female.  The history is provided by the patient and medical records. No language interpreter was used.  Vaginal Bleeding  30 year old female with significant history of ovarian cyst who presents for evaluation of vaginal bleeding.  Patient report for the past week she has been having heavy vaginal bleeding with clots.  States she changed her pad hourly.  She also endorsed some pressure sensation to her lower abdomen without any significant pain.  She mention having vaginal bleeding the past with significant anemia which concerns her.  She denies any new sexual partner denies having fever chills lightheadedness dizziness back pain dysuria hematuria or vaginal discharge.  No specific treatment tried.  Denies any pain with sexual activities or any recent trauma.  Her last menstrual period was approximately 2 months ago.  Past Medical History:  Diagnosis Date   Anemia    Asthma    Diabetes mellitus without complication ([place])    per pt report type 1 diabetic   Mental disorder    depression   Seizure [facility]     Patient Active Problem List   Diagnosis Date Noted   Mass of lower outer quadrant of left breast 11/25/2017   RLQ abdominal pain 11/25/2017   Missed period 11/25/2017   History of ovarian cyst 11/25/2017   Breast abscess 03/16/2017   Pregnancy examination or test, negative result 03/16/2017   Encounter for gynecological examination with Papanicolaou smear of cervix 03/16/2017   Patient desires pregnancy 03/16/2017   Soreness breast 12/02/2016   Nausea vomiting and diarrhea 11/05/2016   Depression 11/05/2016   Fatigue 11/05/2016   Missed periods 11/05/2016   Lower abdominal pain 11/05/2016   Intractable vomiting with nausea 10/10/2016   Appendicitis  01/13/2014   Severe sepsis ([place]) 01/13/2014   Intra-abdominal abscess ([place]) 01/13/2014    Past Surgical History:  Procedure Laterality Date   APPENDECTOMY     CESAREAN SECTION     DRAINAGE TUBE REMOVAL ([place] HX)       OB History     Gravida  4   Para  1   Term  1   Preterm      AB  3   Living  1      SAB  3   IAB      Ectopic      Multiple      Live Births  1           Family History  Adopted: Yes  Family history unknown: Yes    Social History   Tobacco Use   Smoking status: Former    Packs/day: 0.50    Years: 5.00    Pack years: 2.50    Types: Cigarettes    Quit date: 04/14/2012    Years since quitting: 8.6   Smokeless tobacco: Never  Vaping Use   Vaping Use: Never used  Substance Use Topics   Alcohol use: No   Drug use: No    Home Medications Prior to Admission medications   Medication Sig Start Date End Date Taking? Authorizing Provider  acetaminophen (TYLENOL) 500 MG tablet Take 1 tablet (500 mg total) by mouth every 6 (six) hours as needed. 07/07/20   Volney American, PA-C  albuterol [facility]) [phone]  MCG/ACT inhaler Inhale 2 puffs into the lungs every 4 (four) hours as needed for wheezing or shortness of breath (or coughing). 11/24/19   Raylene Everts, MD  levETIRAcetam (KEPPRA) 500 MG tablet Take 500 mg by mouth in the morning, at noon, and at bedtime.    [provider]  medroxyPROGESTERone (PROVERA) 10 MG tablet Take 1 tablet (10 mg total) by mouth in the morning, at noon, and at bedtime. 07/07/20   Volney American, PA-C  zonisamide (ZONEGRAN) 100 MG capsule Take 3 capsules (300 mg total) by mouth 3 (three) times daily. 09/17/19 10/03/20  Truddie Hidden, MD  promethazine (PHENERGAN) 25 MG tablet Take 1 tablet (25 mg total) by mouth every 6 (six) hours as needed for nausea or vomiting. Patient not taking: Reported on 04/09/2018 08/11/17 12/11/18  Estill Dooms, NP    Allergies    Amoxicillin,  Motrin [ibuprofen], and Penicillins  Review of Systems   Review of Systems  Genitourinary:  Positive for vaginal bleeding.  All other systems reviewed and are negative.  Physical Exam Updated Vital Signs BP 125/86   Pulse 61   Temp 98.5 F (36.9 C) (Oral)   Resp 16   SpO2 99%   Physical Exam Vitals and nursing note reviewed.  Constitutional:      General: She is not in acute distress.    Appearance: She is well-developed.  HENT:     Head: Atraumatic.  Eyes:     Conjunctiva/sclera: Conjunctivae normal.  Cardiovascular:     Rate and Rhythm: Normal rate and regular rhythm.     Pulses: Normal pulses.     Heart sounds: Normal heart sounds.  Pulmonary:     Effort: Pulmonary effort is normal.  Abdominal:     Palpations: Abdomen is soft.     Tenderness: There is no abdominal tenderness.  Musculoskeletal:     Cervical back: Neck supple.  Skin:    Findings: No rash.  Neurological:     Mental Status: She is alert.  Psychiatric:        Mood and Affect: Mood normal.    ED Results / Procedures / Treatments   Labs (all labs ordered are listed, but only abnormal results are displayed) Labs Reviewed  COMPREHENSIVE METABOLIC PANEL - Abnormal; Notable for the following components:      Result Value   Glucose, Bld 110 (*)    All other components within normal limits  URINALYSIS, ROUTINE W REFLEX MICROSCOPIC - Abnormal; Notable for the following components:   Color, Urine RED (*)    APPearance TURBID (*)    Hgb urine dipstick MODERATE (*)    Protein, ur 100 (*)    RBC / HPF >50 (*)    WBC, UA >50 (*)    All other components within normal limits  WET PREP, GENITAL  CBC WITH DIFFERENTIAL/PLATELET  LIPASE, BLOOD  I-STAT BETA HCG BLOOD, ED ([facility], [facility], [facility] ONLY)  TYPE AND SCREEN  GC/CHLAMYDIA PROBE AMP ([place]) NOT AT [facility]    EKG None  Radiology US PELVIC COMPLETE W TRANSVAGINAL AND TORSION R/O  Result Date: 12/01/2020 CLINICAL DATA:  Pelvic pain, vaginal bleeding  EXAM: TRANSABDOMINAL AND TRANSVAGINAL ULTRASOUND OF PELVIS DOPPLER ULTRASOUND OF OVARIES TECHNIQUE: Both transabdominal and transvaginal ultrasound examinations of the pelvis were performed. Transabdominal technique was performed for global imaging of the pelvis including uterus, ovaries, adnexal regions, and pelvic cul-de-sac. It was necessary to proceed with endovaginal exam following the transabdominal exam to visualize the  endometrium. Color and duplex Doppler ultrasound was utilized to evaluate blood flow to the ovaries. COMPARISON:  10/17/2019 FINDINGS: Uterus Measurements: 10.5 x 4.6 x 6.9 cm = volume: 174 mL. No fibroids or other mass visualized. Endometrium Thickness: 10 mm.  No focal abnormality visualized. Right ovary Measurements: 4.5 x 2.0 x 4.3 cm = volume: 19.7 mL. Normal appearance/no adnexal mass. Left ovary Measurements: 5.6 x 2.7 x 4.7 cm = volume: 37.4 mL. Normal appearance/no adnexal mass. Dominant follicle left ovary measures up to 2.8 cm. Pulsed Doppler evaluation of both ovaries demonstrates normal low-resistance arterial and venous waveforms. Other findings No abnormal free fluid. IMPRESSION: 1. Age-appropriate pelvic ultrasound, with dominant follicle left ovary measuring up to 2.8 cm. Electronically Signed   By: Randa Ngo M.D.   On: 12/01/2020 23:18    Procedures Pelvic exam  Date/Time: 12/01/2020 8:44 PM Performed by: Domenic Moras, PA-C Authorized by: Domenic Moras, PA-C  Consent: Verbal consent obtained. Risks and benefits: risks, benefits and alternatives were discussed Consent given by: patient Patient understanding: patient states understanding of the procedure being performed Patient identity confirmed: verbally with patient Comments: Danae Chen, NT, available to chaperone.  No inguinal lymphadenopathy or inguinal hernia noted.  Normal external genitalia. discomfort with speculum insertion.  Moderate amount of blood and clots noted in vaginal vault.  Close cervical os, no  obvious signs of injury noted however exam difficult due to large body habitus.  Bilateral adnexal tenderness without cervical motion tenderness.     Medications Ordered in ED Medications  acetaminophen (TYLENOL) tablet 1,000 mg (has no administration in time range)    ED Course  I have reviewed the triage vital signs and the nursing notes.  Pertinent labs & imaging results that were available during my care of the patient were reviewed by me and considered in my medical decision making (see chart for details).    MDM Rules/Calculators/A&P                           BP 130/84   Pulse 64   Temp 98.5 F (36.9 C) (Oral)   Resp 20   SpO2 99%   Final Clinical Impression(s) / ED Diagnoses Final diagnoses:  Dysfunctional uterine bleeding    Rx / DC Orders ED Discharge Orders          Ordered    medroxyPROGESTERone (PROVERA) 10 MG tablet  3 times daily        12/01/20 2324           Patient complaining of recurrent vaginal bleeding ongoing for the past week.  On pelvic exam patient does have moderate mount of blood in vaginal vault.  She also had bilateral adnexal tenderness.  Fortunately her labs are reassuring, pregnancy test negative, hemoglobin is 12.5.  Due to discomfort, and moderate amount of bleeding, will obtain pelvic ultrasound.  I suspect this is likely to be secondary to ovarian cyst.  Plan to prescribe Provera to help lessen her vaginal bleeding.  She does have an OB/GYN specialist that she can follow-up closely.  Pelvic ultrasound demonstrated left ovarian cyst but no other concerning feature.  Patient reassured.  Return precaution given.   Domenic Moras, PA-C 12/01/20 2336    [name], MD 12/02/20 1059
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
## 2020-12-01 NOTE — ED Triage Notes (Signed)
Pt arrived via POV, c/o vaginal bleeding, states she has been saturating 1 pad, every 10 mins, x1 week. Also diffuse abd and pelvic pain.

## 2020-12-01 NOTE — ED Notes (Signed)
Labeled urine specimen and culture sent to lab. ENMiles 

## 2020-12-01 NOTE — Discharge Instructions (Addendum)
You have been evaluated for abnormal vaginal bleeding.  Fortunately your blood count is within normal limit.  Bleeding is likely due to history of ovarian cyst.  Please call and follow-up closely with your OB/GYN for outpatient management of your condition.

## 2020-12-02 LAB — WET PREP, GENITAL
Sperm: NONE SEEN
Trich, Wet Prep: NONE SEEN
Yeast Wet Prep HPF POC: NONE SEEN

## 2020-12-03 LAB — GC/CHLAMYDIA PROBE AMP (~~LOC~~) NOT AT ARMC
Chlamydia: NEGATIVE
Comment: NEGATIVE
Comment: NORMAL
Neisseria Gonorrhea: NEGATIVE

## 2020-12-18 ENCOUNTER — Ambulatory Visit: Payer: Medicaid Other | Admitting: Family Medicine

## 2021-03-29 ENCOUNTER — Other Ambulatory Visit: Payer: Self-pay

## 2021-03-29 ENCOUNTER — Emergency Department (HOSPITAL_COMMUNITY)
Admission: EM | Admit: 2021-03-29 | Discharge: 2021-03-29 | Disposition: A | Discharge status: Home / Self Care | Payer: Medicaid Other | Attending: Emergency Medicine | Admitting: Emergency Medicine

## 2021-03-29 ENCOUNTER — Encounter (HOSPITAL_COMMUNITY): Payer: Self-pay

## 2021-03-29 DIAGNOSIS — R599 Enlarged lymph nodes, unspecified: Secondary | ICD-10-CM | POA: Insufficient documentation

## 2021-03-29 DIAGNOSIS — E119 Type 2 diabetes mellitus without complications: Secondary | ICD-10-CM | POA: Diagnosis not present

## 2021-03-29 DIAGNOSIS — R519 Headache, unspecified: Secondary | ICD-10-CM | POA: Diagnosis present

## 2021-03-29 DIAGNOSIS — F0781 Postconcussional syndrome: Secondary | ICD-10-CM | POA: Diagnosis not present

## 2021-03-29 DIAGNOSIS — Z87891 Personal history of nicotine dependence: Secondary | ICD-10-CM | POA: Diagnosis not present

## 2021-03-29 DIAGNOSIS — J45909 Unspecified asthma, uncomplicated: Secondary | ICD-10-CM | POA: Insufficient documentation

## 2021-03-29 DIAGNOSIS — H538 Other visual disturbances: Secondary | ICD-10-CM | POA: Diagnosis not present

## 2021-03-29 MED ORDER — CLINDAMYCIN HCL 300 MG PO CAPS: 300.0000 mg | ORAL_CAPSULE | Freq: Three times a day (TID) | ORAL | 0 refills | Status: AC

## 2021-03-29 MED ORDER — NAPROXEN 500 MG PO TABS: 500.0000 mg | ORAL_TABLET | Freq: Two times a day (BID) | ORAL | 0 refills | Status: AC
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
## 2021-04-09 ENCOUNTER — Emergency Department (HOSPITAL_COMMUNITY): Payer: Medicaid Other

## 2021-04-09 ENCOUNTER — Other Ambulatory Visit: Payer: Self-pay

## 2021-04-09 ENCOUNTER — Emergency Department (HOSPITAL_COMMUNITY)
Admission: EM | Admit: 2021-04-09 | Discharge: 2021-04-09 | Disposition: A | Discharge status: Home / Self Care | Payer: Medicaid Other | Attending: Emergency Medicine | Admitting: Emergency Medicine

## 2021-04-09 DIAGNOSIS — S0990XA Unspecified injury of head, initial encounter: Secondary | ICD-10-CM

## 2021-04-09 DIAGNOSIS — J45909 Unspecified asthma, uncomplicated: Secondary | ICD-10-CM | POA: Diagnosis not present

## 2021-04-09 DIAGNOSIS — E119 Type 2 diabetes mellitus without complications: Secondary | ICD-10-CM | POA: Diagnosis not present

## 2021-04-09 DIAGNOSIS — N9489 Other specified conditions associated with female genital organs and menstrual cycle: Secondary | ICD-10-CM | POA: Insufficient documentation

## 2021-04-09 DIAGNOSIS — M25561 Pain in right knee: Secondary | ICD-10-CM | POA: Insufficient documentation

## 2021-04-09 DIAGNOSIS — M25532 Pain in left wrist: Secondary | ICD-10-CM | POA: Insufficient documentation

## 2021-04-09 DIAGNOSIS — Z91040 Latex allergy status: Secondary | ICD-10-CM | POA: Diagnosis not present

## 2021-04-09 DIAGNOSIS — S0083XA Contusion of other part of head, initial encounter: Secondary | ICD-10-CM

## 2021-04-09 DIAGNOSIS — Z87891 Personal history of nicotine dependence: Secondary | ICD-10-CM | POA: Diagnosis not present

## 2021-04-09 DIAGNOSIS — Z7982 Long term (current) use of aspirin: Secondary | ICD-10-CM | POA: Diagnosis not present

## 2021-04-09 LAB — CBC WITH DIFFERENTIAL/PLATELET
Abs Immature Granulocytes: 0.02 10*3/uL (ref 0.00–0.07)
Basophils Absolute: 0.1 10*3/uL (ref 0.0–0.1)
Basophils Relative: 1 %
Eosinophils Absolute: 0 10*3/uL (ref 0.0–0.5)
Eosinophils Relative: 0 %
HCT: 38.2 % (ref 36.0–46.0)
Hemoglobin: 12.1 g/dL (ref 12.0–15.0)
Immature Granulocytes: 0 %
Lymphocytes Relative: 13 %
Lymphs Abs: 1.2 10*3/uL (ref 0.7–4.0)
MCH: 26.1 pg (ref 26.0–34.0)
MCHC: 31.7 g/dL (ref 30.0–36.0)
MCV: 82.5 fL (ref 80.0–100.0)
Monocytes Absolute: 0.4 10*3/uL (ref 0.1–1.0)
Monocytes Relative: 4 %
Neutro Abs: 8 10*3/uL — ABNORMAL HIGH (ref 1.7–7.7)
Neutrophils Relative %: 82 %
Platelets: 359 10*3/uL (ref 150–400)
RBC: 4.63 MIL/uL (ref 3.87–5.11)
RDW: 14.3 % (ref 11.5–15.5)
WBC: 9.7 10*3/uL (ref 4.0–10.5)
nRBC: 0 % (ref 0.0–0.2)

## 2021-04-09 LAB — COMPREHENSIVE METABOLIC PANEL
ALT: 17 U/L (ref 0–44)
AST: 28 U/L (ref 15–41)
Albumin: 4.1 g/dL (ref 3.5–5.0)
Alkaline Phosphatase: 69 U/L (ref 38–126)
Anion gap: 15 (ref 5–15)
BUN: 11 mg/dL (ref 6–20)
CO2: 14 mmol/L — ABNORMAL LOW (ref 22–32)
Calcium: 9.1 mg/dL (ref 8.9–10.3)
Chloride: 104 mmol/L (ref 98–111)
Creatinine, Ser: 1.35 mg/dL — ABNORMAL HIGH (ref 0.44–1.00)
GFR, Estimated: 55 mL/min — ABNORMAL LOW (ref >60)
Glucose, Bld: 256 mg/dL — ABNORMAL HIGH (ref 70–99)
Potassium: 3 mmol/L — ABNORMAL LOW (ref 3.5–5.1)
Sodium: 133 mmol/L — ABNORMAL LOW (ref 135–145)
Total Bilirubin: 0.4 mg/dL (ref 0.3–1.2)
Total Protein: 7.9 g/dL (ref 6.5–8.1)

## 2021-04-09 LAB — URINALYSIS, ROUTINE W REFLEX MICROSCOPIC
Bilirubin Urine: NEGATIVE
Glucose, UA: NEGATIVE mg/dL
Hgb urine dipstick: NEGATIVE
Ketones, ur: >80 mg/dL — AB
Leukocytes,Ua: NEGATIVE
Nitrite: NEGATIVE
Protein, ur: NEGATIVE mg/dL
Specific Gravity, Urine: 1.02 (ref 1.005–1.030)
pH: 7 (ref 5.0–8.0)

## 2021-04-09 LAB — I-STAT BETA HCG BLOOD, ED (MC, WL, AP ONLY): I-stat hCG, quantitative: <5 m[IU]/mL (ref <5)

## 2021-04-09 LAB — CBG MONITORING, ED: Glucose-Capillary: 74 mg/dL (ref 70–99)

## 2021-04-09 MED ORDER — LACTATED RINGERS IV BOLUS
1000.0000 mL | Freq: Once | INTRAVENOUS | Status: AC
  Administered 2021-04-09: 13:00:00 1000 mL via INTRAVENOUS

## 2021-04-09 MED ORDER — OXYCODONE-ACETAMINOPHEN 5-325 MG PO TABS
2.0000 | ORAL_TABLET | Freq: Once | ORAL | Status: AC
  Administered 2021-04-09: 13:00:00 2 via ORAL
  Filled 2021-04-09: qty 2

## 2021-04-09 MED ORDER — POTASSIUM CHLORIDE CRYS ER 20 MEQ PO TBCR
40.0000 meq | EXTENDED_RELEASE_TABLET | Freq: Once | ORAL | Status: AC
  Administered 2021-04-09: 13:00:00 40 meq via ORAL
  Filled 2021-04-09: qty 2
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
## 2021-04-09 NOTE — ED Notes (Signed)
Ask pt to pee pt stated they did not have to pee

## 2021-04-10 ENCOUNTER — Other Ambulatory Visit: Payer: Self-pay

## 2021-04-10 ENCOUNTER — Encounter (HOSPITAL_COMMUNITY): Payer: Self-pay | Admitting: Emergency Medicine

## 2021-04-10 ENCOUNTER — Emergency Department (HOSPITAL_COMMUNITY): Payer: Medicaid Other

## 2021-04-10 ENCOUNTER — Emergency Department (HOSPITAL_COMMUNITY)
Admission: EM | Admit: 2021-04-10 | Discharge: 2021-04-11 | Disposition: A | Discharge status: Home / Self Care | Payer: Medicaid Other | Attending: Emergency Medicine | Admitting: Emergency Medicine

## 2021-04-10 DIAGNOSIS — S80212A Abrasion, left knee, initial encounter: Secondary | ICD-10-CM | POA: Diagnosis not present

## 2021-04-10 DIAGNOSIS — S40012A Contusion of left shoulder, initial encounter: Secondary | ICD-10-CM | POA: Insufficient documentation

## 2021-04-10 DIAGNOSIS — S299XXA Unspecified injury of thorax, initial encounter: Secondary | ICD-10-CM | POA: Diagnosis not present

## 2021-04-10 DIAGNOSIS — Z7982 Long term (current) use of aspirin: Secondary | ICD-10-CM | POA: Diagnosis not present

## 2021-04-10 DIAGNOSIS — S50312A Abrasion of left elbow, initial encounter: Secondary | ICD-10-CM | POA: Diagnosis not present

## 2021-04-10 DIAGNOSIS — S0990XA Unspecified injury of head, initial encounter: Secondary | ICD-10-CM | POA: Diagnosis not present

## 2021-04-10 DIAGNOSIS — E119 Type 2 diabetes mellitus without complications: Secondary | ICD-10-CM | POA: Insufficient documentation

## 2021-04-10 DIAGNOSIS — J45909 Unspecified asthma, uncomplicated: Secondary | ICD-10-CM | POA: Insufficient documentation

## 2021-04-10 DIAGNOSIS — Z91040 Latex allergy status: Secondary | ICD-10-CM | POA: Insufficient documentation

## 2021-04-10 DIAGNOSIS — S4992XA Unspecified injury of left shoulder and upper arm, initial encounter: Secondary | ICD-10-CM | POA: Diagnosis present

## 2021-04-10 DIAGNOSIS — S80211A Abrasion, right knee, initial encounter: Secondary | ICD-10-CM | POA: Diagnosis not present

## 2021-04-10 DIAGNOSIS — S3991XA Unspecified injury of abdomen, initial encounter: Secondary | ICD-10-CM | POA: Insufficient documentation

## 2021-04-10 DIAGNOSIS — Z87891 Personal history of nicotine dependence: Secondary | ICD-10-CM | POA: Diagnosis not present

## 2021-04-10 MED ORDER — POTASSIUM CHLORIDE CRYS ER 20 MEQ PO TBCR
40.0000 meq | EXTENDED_RELEASE_TABLET | Freq: Once | ORAL | Status: AC
  Administered 2021-04-10: 20:00:00 40 meq via ORAL
  Filled 2021-04-10: qty 2

## 2021-04-10 MED ORDER — LORAZEPAM 2 MG/ML IJ SOLN
2.0000 mg | Freq: Once | INTRAMUSCULAR | Status: AC
  Administered 2021-04-10: 20:00:00 2 mg via INTRAVENOUS
  Filled 2021-04-10: qty 1

## 2021-04-10 MED ORDER — HYDROMORPHONE HCL 1 MG/ML IJ SOLN
1.0000 mg | Freq: Once | INTRAMUSCULAR | Status: AC
  Administered 2021-04-10: 20:00:00 1 mg via INTRAVENOUS
  Filled 2021-04-10: qty 1

## 2021-04-10 MED ORDER — IOHEXOL 300 MG/ML  SOLN
100.0000 mL | Freq: Once | INTRAMUSCULAR | Status: AC | PRN
  Administered 2021-04-10: 19:00:00 100 mL via INTRAVENOUS
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
## 2021-04-10 NOTE — Discharge Instructions (Signed)
[facility] [address] [place], [place] [address] [phone]

## 2021-06-15 ENCOUNTER — Other Ambulatory Visit: Payer: Self-pay

## 2021-06-15 ENCOUNTER — Emergency Department (HOSPITAL_COMMUNITY)
Admission: EM | Admit: 2021-06-15 | Discharge: 2021-06-15 | Disposition: A | Discharge status: Home / Self Care | Payer: Medicaid Other | Attending: Emergency Medicine | Admitting: Emergency Medicine

## 2021-06-15 ENCOUNTER — Emergency Department (HOSPITAL_COMMUNITY): Payer: Medicaid Other

## 2021-06-15 ENCOUNTER — Encounter (HOSPITAL_COMMUNITY): Payer: Self-pay

## 2021-06-15 DIAGNOSIS — Z91040 Latex allergy status: Secondary | ICD-10-CM | POA: Insufficient documentation

## 2021-06-15 DIAGNOSIS — N939 Abnormal uterine and vaginal bleeding, unspecified: Secondary | ICD-10-CM

## 2021-06-15 DIAGNOSIS — Z7982 Long term (current) use of aspirin: Secondary | ICD-10-CM | POA: Insufficient documentation

## 2021-06-15 DIAGNOSIS — N921 Excessive and frequent menstruation with irregular cycle: Secondary | ICD-10-CM | POA: Insufficient documentation

## 2021-06-15 DIAGNOSIS — D509 Iron deficiency anemia, unspecified: Secondary | ICD-10-CM | POA: Diagnosis not present

## 2021-06-15 LAB — CBC WITH DIFFERENTIAL/PLATELET
Abs Immature Granulocytes: 0.01 10*3/uL (ref 0.00–0.07)
Basophils Absolute: 0.1 10*3/uL (ref 0.0–0.1)
Basophils Relative: 1 %
Eosinophils Absolute: 0.1 10*3/uL (ref 0.0–0.5)
Eosinophils Relative: 2 %
HCT: 30.7 % — ABNORMAL LOW (ref 36.0–46.0)
Hemoglobin: 9.7 g/dL — ABNORMAL LOW (ref 12.0–15.0)
Immature Granulocytes: 0 %
Lymphocytes Relative: 42 %
Lymphs Abs: 3.1 10*3/uL (ref 0.7–4.0)
MCH: 25 pg — ABNORMAL LOW (ref 26.0–34.0)
MCHC: 31.6 g/dL (ref 30.0–36.0)
MCV: 79.1 fL — ABNORMAL LOW (ref 80.0–100.0)
Monocytes Absolute: 0.7 10*3/uL (ref 0.1–1.0)
Monocytes Relative: 10 %
Neutro Abs: 3.4 10*3/uL (ref 1.7–7.7)
Neutrophils Relative %: 45 %
Platelets: 297 10*3/uL (ref 150–400)
RBC: 3.88 MIL/uL (ref 3.87–5.11)
RDW: 13.4 % (ref 11.5–15.5)
WBC: 7.4 10*3/uL (ref 4.0–10.5)
nRBC: 0 % (ref 0.0–0.2)

## 2021-06-15 LAB — TYPE AND SCREEN
ABO/RH(D): B POS
Antibody Screen: NEGATIVE

## 2021-06-15 LAB — HCG, QUANTITATIVE, PREGNANCY: hCG, Beta Chain, Quant, S: <1 m[IU]/mL (ref <5)

## 2021-06-15 LAB — BASIC METABOLIC PANEL
Anion gap: 5 (ref 5–15)
BUN: 8 mg/dL (ref 6–20)
CO2: 25 mmol/L (ref 22–32)
Calcium: 8.7 mg/dL — ABNORMAL LOW (ref 8.9–10.3)
Chloride: 105 mmol/L (ref 98–111)
Creatinine, Ser: 0.85 mg/dL (ref 0.44–1.00)
GFR, Estimated: >60 mL/min (ref >60)
Glucose, Bld: 92 mg/dL (ref 70–99)
Potassium: 3.1 mmol/L — ABNORMAL LOW (ref 3.5–5.1)
Sodium: 135 mmol/L (ref 135–145)

## 2021-06-15 LAB — CBG MONITORING, ED: Glucose-Capillary: 89 mg/dL (ref 70–99)

## 2021-06-15 MED ORDER — SODIUM CHLORIDE 0.9 % IV BOLUS
1000.0000 mL | Freq: Once | INTRAVENOUS | Status: AC
  Administered 2021-06-15: 1000 mL via INTRAVENOUS

## 2021-06-15 MED ORDER — POTASSIUM CHLORIDE CRYS ER 20 MEQ PO TBCR
40.0000 meq | EXTENDED_RELEASE_TABLET | Freq: Once | ORAL | Status: AC
  Administered 2021-06-15: 40 meq via ORAL
  Filled 2021-06-15: qty 2

## 2021-06-15 MED ORDER — ONDANSETRON HCL 4 MG/2ML IJ SOLN
4.0000 mg | Freq: Once | INTRAMUSCULAR | Status: AC
  Administered 2021-06-15: 4 mg via INTRAVENOUS
  Filled 2021-06-15: qty 2

## 2021-06-15 MED ORDER — MEGESTROL ACETATE 40 MG PO TABS
40.0000 mg | ORAL_TABLET | Freq: Every day | ORAL | Status: DC
  Administered 2021-06-15: 40 mg via ORAL
  Filled 2021-06-15: qty 1

## 2021-06-15 MED ORDER — MEGESTROL ACETATE 40 MG PO TABS: 40.0000 mg | ORAL_TABLET | Freq: Two times a day (BID) | ORAL | 0 refills | Status: DC

## 2021-06-15 NOTE — ED Notes (Signed)
Patient given grape juice per request for PO challenge.  ?

## 2021-06-15 NOTE — ED Notes (Signed)
Patient still c/o nausea.  ?

## 2021-06-15 NOTE — ED Triage Notes (Addendum)
Patient has been feeling like she is going to pass out now for 3 weeks. Has been having heavy vaginal bleeding, missed last period. Patient said she has been bleeding every day for 3 weeks vaginally. Changing her pads every 10 minutes. Has diabetes (not taking medications), has seizures (not prescribed medication). Has pounding headache.  ?

## 2021-06-15 NOTE — ED Provider Notes (Signed)
[facility] DEPT Provider Note   CSN: [id number] Arrival date & time: 06/15/21  1916     History  Chief Complaint  Patient presents with   Vaginal Bleeding    Jamie [name] is a 31 y.o. female.  HPI She complains of heavy vaginal bleeding for several days with clots, after missing her period in February.  Last period was in middle part of January.  She does not think she is pregnant.  She typically has regular periods.  She is feeling weak and dizzy when she stands up.  She denies fever.  She has been nauseated but not having any vomiting.  She complains of low back pain for several days.  She denies dysuria, urinary frequency or hematuria.    Home Medications Prior to Admission medications   Medication Sig Start Date End Date Taking? Authorizing Provider  megestrol (MEGACE) 40 MG tablet Take 1 tablet (40 mg total) by mouth 2 (two) times daily. 06/15/21  Yes [name], MD  acetaminophen (TYLENOL) 500 MG tablet Take 1 tablet (500 mg total) by mouth every 6 (six) hours as needed. Patient taking differently: Take 500 mg by mouth every 6 (six) hours as needed for mild pain. 07/07/20   Volney American, PA-C  albuterol [facility]) [phone] Base) MCG/ACT inhaler Inhale 2 puffs into the lungs every 4 (four) hours as needed for wheezing or shortness of breath (or coughing). 11/24/19   Raylene Everts, MD  aspirin EC 81 MG tablet Take 81 mg by mouth daily. Swallow whole.    [provider]  folic acid (FOLVITE) 1 MG tablet Take 3 mg by mouth daily.    [provider]  levETIRAcetam (KEPPRA) 500 MG tablet Take 500 mg by mouth in the morning, at noon, and at bedtime.    [provider]  medroxyPROGESTERone (PROVERA) 10 MG tablet Take 1 tablet (10 mg total) by mouth in the morning, at noon, and at bedtime. Patient not taking: Reported on 04/09/2021 12/01/20   Domenic Moras, PA-C  Prenatal Vit-Fe Fumarate-FA  (MULTIVITAMIN-PRENATAL) 27-0.8 MG TABS tablet Take 1 tablet by mouth daily at 12 noon.    [provider]  zonisamide (ZONEGRAN) 100 MG capsule Take 3 capsules (300 mg total) by mouth 3 (three) times daily. Patient taking differently: Take 100 mg by mouth 3 (three) times daily. 09/17/19 05/25/21  Truddie Hidden, MD  promethazine (PHENERGAN) 25 MG tablet Take 1 tablet (25 mg total) by mouth every 6 (six) hours as needed for nausea or vomiting. Patient not taking: Reported on 04/09/2018 08/11/17 12/11/18  Estill Dooms, NP      Allergies    Amoxicillin, Latex, Motrin [ibuprofen], and Penicillins    Review of Systems   Review of Systems  Physical Exam Updated Vital Signs BP 111/80    Pulse 79    Temp 97.7 F (36.5 C) (Oral)    Resp 16    Ht '5\' 5"'$  (1.651 m)    Wt 85 kg    SpO2 100%    BMI 31.18 kg/m  Physical Exam Vitals and nursing note reviewed.  Constitutional:      General: She is not in acute distress.    Appearance: She is well-developed. She is not ill-appearing, toxic-appearing or diaphoretic.  HENT:     Head: Normocephalic and atraumatic.     Right Ear: External ear normal.     Left Ear: External ear normal.  Eyes:     Conjunctiva/sclera:  Conjunctivae normal.     Pupils: Pupils are equal, round, and reactive to light.  Neck:     Trachea: Phonation normal.  Cardiovascular:     Rate and Rhythm: Normal rate.  Pulmonary:     Effort: Pulmonary effort is normal. No respiratory distress.     Breath sounds: No stridor.  Abdominal:     General: There is no distension.  Musculoskeletal:        General: Normal range of motion.     Cervical back: Normal range of motion and neck supple.  Skin:    General: Skin is warm and dry.  Neurological:     Mental Status: She is alert and oriented to person, place, and time.     Cranial Nerves: No cranial nerve deficit.     Sensory: No sensory deficit.     Motor: No abnormal muscle tone.     Coordination: Coordination  normal.  Psychiatric:        Mood and Affect: Mood normal.        Behavior: Behavior normal.        Thought Content: Thought content normal.        Judgment: Judgment normal.    ED Results / Procedures / Treatments   Labs (all labs ordered are listed, but only abnormal results are displayed) Labs Reviewed  BASIC METABOLIC PANEL - Abnormal; Notable for the following components:      Result Value   Potassium 3.1 (*)    Calcium 8.7 (*)    All other components within normal limits  CBC WITH DIFFERENTIAL/PLATELET - Abnormal; Notable for the following components:   Hemoglobin 9.7 (*)    HCT 30.7 (*)    MCV 79.1 (*)    MCH 25.0 (*)    All other components within normal limits  HCG, QUANTITATIVE, PREGNANCY  CBG MONITORING, ED  TYPE AND SCREEN    EKG None  Radiology US Pelvis Complete  Result Date: 06/15/2021 CLINICAL DATA:  Menorrhagia. EXAM: TRANSABDOMINAL ULTRASOUND OF PELVIS TECHNIQUE: Transabdominal ultrasound examination of the pelvis was performed including evaluation of the uterus, ovaries, adnexal regions, and pelvic cul-de-sac. COMPARISON:  None. FINDINGS: Uterus Measurements: 11.8 x 4.1 x 6.6 cm = volume: 167 mL. No fibroids or other mass visualized. Endometrium Thickness: 5.7 mm.  No focal abnormality visualized. Right ovary Measurements: 4.2 x 1.8 x 3.3 cm = volume: 13 mL. Normal appearance/no adnexal mass. Left ovary Measurements: 4.5 x 2.1 x 3.9 cm = volume: 19.7 mL. Normal appearance/no adnexal mass. Other findings:  No abnormal free fluid. IMPRESSION: 1. Normal pelvic ultrasound. Electronically Signed   By: Ronney Asters M.D.   On: 06/15/2021 22:35    Procedures Procedures    Medications Ordered in ED Medications  megestrol (MEGACE) tablet 40 mg (has no administration in time range)  sodium chloride 0.9 % bolus 1,000 mL (0 mLs Intravenous Stopped 06/15/21 2125)  ondansetron (ZOFRAN) injection 4 mg (4 mg Intravenous Given 06/15/21 2011)  potassium chloride SA (KLOR-CON  M) CR tablet 40 mEq (40 mEq Oral Given 06/15/21 2025)  sodium chloride 0.9 % bolus 1,000 mL (0 mLs Intravenous Stopped 06/15/21 2229)    ED Course/ Medical Decision Making/ A&P Clinical Course as of 06/15/21 2307  Sat Jun 15, 2021  2110 Patient continues to "feel weak."  She states she has continued to have vaginal bleeding.  At this time abdomen remains nontender to palpation. [EW]    Clinical Course User Index [EW] [name], MD  Medical Decision Making She presents for evaluation of heavy bleeding after menses menses.  She has had this problem previously and been evaluated by gynecology.  Problems Addressed: Iron deficiency anemia, unspecified iron deficiency anemia type: acute illness or injury    Details: Associated with menorrhagia.  Amount and/or Complexity of Data Reviewed Independent Historian:     Details: She is a cogent historian External Data Reviewed: notes.    Details: Seen for same problem by gynecology, 6 months ago and plan observation with follow-up. Labs: ordered.    Details: CBC, metabolic panel, pregnancy test, blood count Radiology: ordered.    Details: Pelvic ultrasound to evaluate for ovarian cyst- Discussion of management or test interpretation with external provider(s): Case discussed with gynecologist, Dr. Rip Harbour, who recommends starting patient on Megace, 40 mg twice daily.  He will help patient schedule follow-up appointment in his office with one of his partners.  Risk Prescription drug management. Decision regarding hospitalization. Risk Details: Patient presenting for evaluation of heavy vaginal bleeding.  She is not pregnant.  She is hemodynamically stable.  Her hemoglobin is down from prior by about 2 g.  She is started on Megace in the ED, at the recommendation of oncology.  He will arrange follow-up for her in his office.  She does not require hospitalization for management at this time.  No indication for further ED  testing or treatment.           Final Clinical Impression(s) / ED Diagnoses Final diagnoses:  Menorrhagia with irregular cycle  Iron deficiency anemia, unspecified iron deficiency anemia type  Vaginal bleeding    Rx / DC Orders ED Discharge Orders          Ordered    megestrol (MEGACE) 40 MG tablet  2 times daily        06/15/21 2254              [name], MD 06/15/21 2307

## 2021-06-15 NOTE — Discharge Instructions (Signed)
We are starting you on Megace to slow down the vaginal bleeding.  Get the prescription filled and start taking it tomorrow morning.  You need to take it twice a day until its gone.  We have contacted the gynecologist, Dr. Rip Harbour who will help to schedule an appointment.  You can call his office on Monday morning if you have not yet heard from them.  I will also help to take an iron tablet, 325 mg, twice a day to help build up your hemoglobin level.  Make sure you are getting plenty of rest and drinking a lot of fluids.  Return to the emergency department if you have problems that cannot be managed at home. ?

## 2021-07-10 ENCOUNTER — Emergency Department (HOSPITAL_COMMUNITY)
Admission: EM | Admit: 2021-07-10 | Discharge: 2021-07-10 | Disposition: A | Discharge status: Home / Self Care | Payer: Medicaid Other | Attending: Emergency Medicine | Admitting: Emergency Medicine

## 2021-07-10 ENCOUNTER — Encounter (HOSPITAL_COMMUNITY): Payer: Self-pay | Admitting: Emergency Medicine

## 2021-07-10 ENCOUNTER — Emergency Department (HOSPITAL_COMMUNITY): Payer: Medicaid Other

## 2021-07-10 DIAGNOSIS — Z7951 Long term (current) use of inhaled steroids: Secondary | ICD-10-CM | POA: Diagnosis not present

## 2021-07-10 DIAGNOSIS — R062 Wheezing: Secondary | ICD-10-CM | POA: Diagnosis not present

## 2021-07-10 DIAGNOSIS — Z91040 Latex allergy status: Secondary | ICD-10-CM | POA: Insufficient documentation

## 2021-07-10 DIAGNOSIS — J45909 Unspecified asthma, uncomplicated: Secondary | ICD-10-CM | POA: Diagnosis not present

## 2021-07-10 DIAGNOSIS — Z7982 Long term (current) use of aspirin: Secondary | ICD-10-CM | POA: Insufficient documentation

## 2021-07-10 DIAGNOSIS — D649 Anemia, unspecified: Secondary | ICD-10-CM | POA: Insufficient documentation

## 2021-07-10 DIAGNOSIS — R0602 Shortness of breath: Secondary | ICD-10-CM | POA: Insufficient documentation

## 2021-07-10 LAB — COMPREHENSIVE METABOLIC PANEL
ALT: 11 U/L (ref 0–44)
AST: 15 U/L (ref 15–41)
Albumin: 3.8 g/dL (ref 3.5–5.0)
Alkaline Phosphatase: 63 U/L (ref 38–126)
Anion gap: 7 (ref 5–15)
BUN: 8 mg/dL (ref 6–20)
CO2: 23 mmol/L (ref 22–32)
Calcium: 8.5 mg/dL — ABNORMAL LOW (ref 8.9–10.3)
Chloride: 107 mmol/L (ref 98–111)
Creatinine, Ser: 0.78 mg/dL (ref 0.44–1.00)
GFR, Estimated: >60 mL/min (ref >60)
Glucose, Bld: 88 mg/dL (ref 70–99)
Potassium: 3.5 mmol/L (ref 3.5–5.1)
Sodium: 137 mmol/L (ref 135–145)
Total Bilirubin: 0.3 mg/dL (ref 0.3–1.2)
Total Protein: 7.3 g/dL (ref 6.5–8.1)

## 2021-07-10 LAB — CBC WITH DIFFERENTIAL/PLATELET
Abs Immature Granulocytes: 0.02 10*3/uL (ref 0.00–0.07)
Basophils Absolute: 0.1 10*3/uL (ref 0.0–0.1)
Basophils Relative: 1 %
Eosinophils Absolute: 0.1 10*3/uL (ref 0.0–0.5)
Eosinophils Relative: 2 %
HCT: 32.2 % — ABNORMAL LOW (ref 36.0–46.0)
Hemoglobin: 9.8 g/dL — ABNORMAL LOW (ref 12.0–15.0)
Immature Granulocytes: 0 %
Lymphocytes Relative: 37 %
Lymphs Abs: 2.5 10*3/uL (ref 0.7–4.0)
MCH: 24.2 pg — ABNORMAL LOW (ref 26.0–34.0)
MCHC: 30.4 g/dL (ref 30.0–36.0)
MCV: 79.5 fL — ABNORMAL LOW (ref 80.0–100.0)
Monocytes Absolute: 0.5 10*3/uL (ref 0.1–1.0)
Monocytes Relative: 7 %
Neutro Abs: 3.7 10*3/uL (ref 1.7–7.7)
Neutrophils Relative %: 53 %
Platelets: 269 10*3/uL (ref 150–400)
RBC: 4.05 MIL/uL (ref 3.87–5.11)
RDW: 13.8 % (ref 11.5–15.5)
WBC: 6.9 10*3/uL (ref 4.0–10.5)
nRBC: 0 % (ref 0.0–0.2)

## 2021-07-10 MED ORDER — IPRATROPIUM-ALBUTEROL 0.5-2.5 (3) MG/3ML IN SOLN
3.0000 mL | Freq: Once | RESPIRATORY_TRACT | Status: AC
  Administered 2021-07-10: 3 mL via RESPIRATORY_TRACT
  Filled 2021-07-10: qty 3

## 2021-07-10 NOTE — ED Provider Notes (Signed)
?[facility] DEPT ?Provider Note ? ? ?CSN: [id number] ?Arrival date & time: 07/10/21  1520 ? ?  ? ?History ? ?Chief Complaint  ?Patient presents with  ? Asthma  ? ? ?Jamie [name] is a 31 y.o. female.  The patient complains of 3 days of increased wheezing.  Patient states that she has had a difficult time sleeping at night.  Endorses shortness of breath, wheezing.  Denies chest pain, denies fever denies recent illness or sick contact.  She was supposed to have a sleep study on the first of this month but was unable to go.  The patient was admitted to the hospital approximately 3 weeks ago due to to a hemorrhage and was noted to be anemic at that time.  Denies the use of any medication for her asthma.  PMH significant for asthma, fatigue, history of sepsis ? ?HPI ? ?  ? ?Home Medications ?Prior to Admission medications   ?Medication Sig Start Date End Date Taking? Authorizing Provider  ?acetaminophen (TYLENOL) 500 MG tablet Take 1 tablet (500 mg total) by mouth every 6 (six) hours as needed. ?Patient taking differently: Take 500 mg by mouth every 6 (six) hours as needed for mild pain. 07/07/20   Volney American, PA-C  ?albuterol [facility] HFA) 108 (90 Base) MCG/ACT inhaler Inhale 2 puffs into the lungs every 4 (four) hours as needed for wheezing or shortness of breath (or coughing). 11/24/19   Raylene Everts, MD  ?aspirin EC 81 MG tablet Take 81 mg by mouth daily. Swallow whole.    [provider]  ?folic acid (FOLVITE) 1 MG tablet Take 3 mg by mouth daily.    [provider]  ?levETIRAcetam (KEPPRA) 500 MG tablet Take 500 mg by mouth in the morning, at noon, and at bedtime.    [provider]  ?medroxyPROGESTERone (PROVERA) 10 MG tablet Take 1 tablet (10 mg total) by mouth in the morning, at noon, and at bedtime. ?Patient not taking: Reported on 04/09/2021 12/01/20   Domenic Moras, PA-C  ?megestrol (MEGACE) 40 MG tablet Take 1 tablet (40 mg total) by  mouth 2 (two) times daily. 06/15/21   [name], MD  ?Prenatal Vit-Fe Fumarate-FA (MULTIVITAMIN-PRENATAL) 27-0.8 MG TABS tablet Take 1 tablet by mouth daily at 12 noon.    [provider]  ?zonisamide (ZONEGRAN) 100 MG capsule Take 3 capsules (300 mg total) by mouth 3 (three) times daily. ?Patient taking differently: Take 100 mg by mouth 3 (three) times daily. 09/17/19 05/25/21  Truddie Hidden, MD  ?promethazine (PHENERGAN) 25 MG tablet Take 1 tablet (25 mg total) by mouth every 6 (six) hours as needed for nausea or vomiting. ?Patient not taking: Reported on 04/09/2018 08/11/17 12/11/18  Estill Dooms, NP  ?   ? ?Allergies    ?Amoxicillin, Latex, Motrin [ibuprofen], and Penicillins   ? ?Review of Systems   ?Review of Systems  ?Constitutional:  Negative for fever.  ?Respiratory:  Positive for shortness of breath and wheezing. Negative for cough.   ?Cardiovascular:  Negative for chest pain.  ?Gastrointestinal:  Negative for abdominal pain, diarrhea, nausea and vomiting.  ?Genitourinary:  Negative for dysuria.  ?Neurological:  Negative for syncope and light-headedness.  ? ?Physical Exam ?Updated Vital Signs ?BP 105/65   Pulse 79   Temp 98.3 ?F (36.8 ?C) (Oral)   Resp 16   LMP 06/05/2021 (Approximate)   SpO2 100%  ?Physical Exam ?Vitals and nursing note reviewed.  ?Constitutional:   ?  General: She is not in acute distress. ?HENT:  ?   Head: Normocephalic and atraumatic.  ?   Mouth/Throat:  ?   Mouth: Mucous membranes are moist.  ?Eyes:  ?   Conjunctiva/sclera: Conjunctivae normal.  ?Cardiovascular:  ?   Rate and Rhythm: Normal rate and regular rhythm.  ?   Pulses: Normal pulses.  ?   Heart sounds: Normal heart sounds.  ?Pulmonary:  ?   Effort: Pulmonary effort is normal.  ?   Breath sounds: Normal breath sounds.  ?Abdominal:  ?   Palpations: Abdomen is soft.  ?   Tenderness: There is no abdominal tenderness.  ?Musculoskeletal:  ?   Cervical back: Normal range of motion.  ?Skin: ?   General: Skin  is warm and dry.  ?   Coloration: Skin is not pale.  ?Neurological:  ?   Mental Status: She is alert.  ? ? ?ED Results / Procedures / Treatments   ?Labs ?(all labs ordered are listed, but only abnormal results are displayed) ?Labs Reviewed  ?COMPREHENSIVE METABOLIC PANEL - Abnormal; Notable for the following components:  ?    Result Value  ? Calcium 8.5 (*)   ? All other components within normal limits  ?CBC WITH DIFFERENTIAL/PLATELET - Abnormal; Notable for the following components:  ? Hemoglobin 9.8 (*)   ? HCT 32.2 (*)   ? MCV 79.5 (*)   ? MCH 24.2 (*)   ? All other components within normal limits  ? ? ?EKG ?None ? ?Radiology ?DG Chest 2 View ? ?Result Date: 07/10/2021 ?CLINICAL DATA:  shortness of breath and wheezing EXAM: CHEST - 2 VIEW COMPARISON:  04/10/2021 FINDINGS: Midline trachea. Normal heart size and mediastinal contours. No pleural effusion or pneumothorax. Clear lungs. IMPRESSION: No active cardiopulmonary disease. Electronically Signed   By: Abigail Miyamoto M.D.   On: 07/10/2021 16:26   ? ?Procedures ?Procedures  ? ? ?Medications Ordered in ED ?Medications  ?ipratropium-albuterol (DUONEB) 0.5-2.5 (3) MG/3ML nebulizer solution 3 mL (3 mLs Nebulization Given 07/10/21 1738)  ? ? ?ED Course/ Medical Decision Making/ A&P ?  ?                        ?Medical Decision Making ?Amount and/or Complexity of Data Reviewed ?Labs: ordered. ?Radiology: ordered. ? ?Risk ?Prescription drug management. ? ? ?This patient presents to the ED for concern of wheezing, this involves an extensive number of treatment options, and is a complaint that carries with it a high risk of complications and morbidity.  The differential diagnosis includes but is not limited to asthma exacerbation, pneumonia, shortness of breath due to anemia, and others ? ? ?Co morbidities that complicate the patient evaluation ? ?History of asthma, history of anemia ? ? ?Additional history obtained: ? ? ?External records from outside source obtained and  reviewed including hospital records from 2023, March ? ? ?Lab Tests: ? ?I Ordered, and personally interpreted labs.  The pertinent results include: Hemoglobin 9.8, stable from 3 weeks ago ? ? ?Imaging Studies ordered: ? ?I ordered imaging studies including chest x-ray ?I independently visualized and interpreted imaging which showed no acute disease ?I agree with the radiologist interpretation ? ? ?Cardiac Monitoring: / EKG: ? ?The patient was maintained on a cardiac monitor.  I personally viewed and interpreted the cardiac monitored which showed an underlying rhythm of: sinus rhythm ? ? ?Problem List / ED Course / Critical interventions / Medication management ? ? ?I ordered medication including DuoNeb  for shortness of breath ?Reevaluation of the patient after these medicines showed that the patient improved ?I have reviewed the patients home medicines and have made adjustments as needed ? ?Test / Admission - Considered: ? ?The patient's feeling of shortness of breath resolved after DuoNeb administration.  The patient remains anemic after her last hospitalization but her hemoglobin has not worsened.  She plans to follow-up with her primary care provider about the anemia.  No pneumonia was evident on chest x-ray and the patient does not have a white count at this time.  Lungs are clear to auscultation at this time.  I believe the patient is safe to discharge home.  Her saturations on room air have been above 95% since arrival.  Follow-up with primary care provider to discuss possible metered-dose inhaler prescription and to discuss anemia.  I discussed the plan with the patient the patient agrees to plan and is eager for discharge home. ? ? ?Final Clinical Impression(s) / ED Diagnoses ?Final diagnoses:  ?Wheezing  ? ? ?Rx / DC Orders ?ED Discharge Orders   ? ? None  ? ?  ? ? ?  ?Dorothyann Peng, PA-C ?07/10/21 1814 ? ?  ?Lajean Saver, MD ?07/10/21 4332 ? ?

## 2021-07-10 NOTE — Discharge Instructions (Signed)
You were seen today for mild shortness of breath. You were found to be anemic on labs. This has not worsened since your last visit. Your shortness of breath improved with medication. As discussed, I recommend follow up with your PCP to help with management of your health.  ?

## 2021-07-10 NOTE — ED Provider Triage Note (Signed)
Emergency Medicine Provider Triage Evaluation Note ? ?Jamie [name] , a 31 y.o. female  was evaluated in triage.  Pt complains of 3 days of wheezing.  Patient states that her wheezes have made it hard to sleep at night.  Patient has a history of asthma but is out of her metered-dose inhaler.  Patient denies chest pain.  Endorses shortness of breath.  Complains of wheezing. ? ?Review of Systems  ?Positive: Wheezing, shortness of breath ?Negative: Chest pain ? ?Physical Exam  ?BP 115/72 (BP Location: Left Arm)   Pulse 70   Temp (!) 97.4 ?F (36.3 ?C) (Oral)   Resp 16   SpO2 100%  ?Gen:   Awake, no distress   ?Resp:  Normal effort, lungs CTA bilaterally ?MSK:   Moves extremities without difficulty  ?Other:   ? ?Medical Decision Making  ?Medically screening exam initiated at 3:37 PM.  Appropriate orders placed.  Jamie [name] was informed that the remainder of the evaluation will be completed by another provider, this initial triage assessment does not replace that evaluation, and the importance of remaining in the ED until their evaluation is complete. ? ? ?  ?Dorothyann Peng, PA-C ?07/10/21 1540 ? ?

## 2021-07-10 NOTE — ED Triage Notes (Signed)
Per pt, states she is having some wheezing-does not have inhaler-history of asthma ?

## 2021-07-18 ENCOUNTER — Encounter: Payer: Self-pay | Admitting: Obstetrics and Gynecology

## 2021-07-18 ENCOUNTER — Ambulatory Visit: Payer: Medicaid Other | Admitting: Obstetrics and Gynecology

## 2021-07-19 NOTE — Progress Notes (Signed)
Patient did not keep her GYN ED follow up appointment for 07/18/2021. ? ?Durene Romans MD ?Attending ?[facility] [employer] [facility]) ? ?

## 2021-08-13 ENCOUNTER — Other Ambulatory Visit: Payer: Self-pay

## 2021-08-13 ENCOUNTER — Encounter (HOSPITAL_COMMUNITY): Payer: Self-pay

## 2021-08-13 ENCOUNTER — Emergency Department (HOSPITAL_COMMUNITY): Payer: Medicaid Other

## 2021-08-13 ENCOUNTER — Emergency Department (HOSPITAL_COMMUNITY)
Admission: EM | Admit: 2021-08-13 | Discharge: 2021-08-14 | Disposition: A | Discharge status: Home / Self Care | Payer: Medicaid Other | Attending: Emergency Medicine | Admitting: Emergency Medicine

## 2021-08-13 DIAGNOSIS — R1031 Right lower quadrant pain: Secondary | ICD-10-CM | POA: Insufficient documentation

## 2021-08-13 DIAGNOSIS — Z7951 Long term (current) use of inhaled steroids: Secondary | ICD-10-CM | POA: Insufficient documentation

## 2021-08-13 DIAGNOSIS — N83209 Unspecified ovarian cyst, unspecified side: Secondary | ICD-10-CM

## 2021-08-13 DIAGNOSIS — N938 Other specified abnormal uterine and vaginal bleeding: Secondary | ICD-10-CM | POA: Diagnosis present

## 2021-08-13 DIAGNOSIS — E109 Type 1 diabetes mellitus without complications: Secondary | ICD-10-CM | POA: Insufficient documentation

## 2021-08-13 DIAGNOSIS — D219 Benign neoplasm of connective and other soft tissue, unspecified: Secondary | ICD-10-CM

## 2021-08-13 DIAGNOSIS — Z91040 Latex allergy status: Secondary | ICD-10-CM | POA: Insufficient documentation

## 2021-08-13 DIAGNOSIS — N9489 Other specified conditions associated with female genital organs and menstrual cycle: Secondary | ICD-10-CM | POA: Insufficient documentation

## 2021-08-13 DIAGNOSIS — D259 Leiomyoma of uterus, unspecified: Secondary | ICD-10-CM | POA: Diagnosis not present

## 2021-08-13 DIAGNOSIS — N8301 Follicular cyst of right ovary: Secondary | ICD-10-CM | POA: Insufficient documentation

## 2021-08-13 DIAGNOSIS — Z7982 Long term (current) use of aspirin: Secondary | ICD-10-CM | POA: Diagnosis not present

## 2021-08-13 DIAGNOSIS — N8300 Follicular cyst of ovary, unspecified side: Secondary | ICD-10-CM | POA: Diagnosis not present

## 2021-08-13 DIAGNOSIS — J45909 Unspecified asthma, uncomplicated: Secondary | ICD-10-CM | POA: Insufficient documentation

## 2021-08-13 DIAGNOSIS — N939 Abnormal uterine and vaginal bleeding, unspecified: Secondary | ICD-10-CM

## 2021-08-13 HISTORY — DX: Bronchitis, not specified as acute or chronic: J40

## 2021-08-13 LAB — CBC WITH DIFFERENTIAL/PLATELET
Abs Immature Granulocytes: 0.02 10*3/uL (ref 0.00–0.07)
Basophils Absolute: 0.1 10*3/uL (ref 0.0–0.1)
Basophils Relative: 1 %
Eosinophils Absolute: 0.1 10*3/uL (ref 0.0–0.5)
Eosinophils Relative: 2 %
HCT: 35.6 % — ABNORMAL LOW (ref 36.0–46.0)
Hemoglobin: 10.8 g/dL — ABNORMAL LOW (ref 12.0–15.0)
Immature Granulocytes: 0 %
Lymphocytes Relative: 34 %
Lymphs Abs: 2.3 10*3/uL (ref 0.7–4.0)
MCH: 24.5 pg — ABNORMAL LOW (ref 26.0–34.0)
MCHC: 30.3 g/dL (ref 30.0–36.0)
MCV: 80.9 fL (ref 80.0–100.0)
Monocytes Absolute: 0.5 10*3/uL (ref 0.1–1.0)
Monocytes Relative: 7 %
Neutro Abs: 3.7 10*3/uL (ref 1.7–7.7)
Neutrophils Relative %: 56 %
Platelets: 271 10*3/uL (ref 150–400)
RBC: 4.4 MIL/uL (ref 3.87–5.11)
RDW: 17.9 % — ABNORMAL HIGH (ref 11.5–15.5)
WBC: 6.7 10*3/uL (ref 4.0–10.5)
nRBC: 0 % (ref 0.0–0.2)

## 2021-08-13 LAB — COMPREHENSIVE METABOLIC PANEL
ALT: 12 U/L (ref 0–44)
AST: 16 U/L (ref 15–41)
Albumin: 3.6 g/dL (ref 3.5–5.0)
Alkaline Phosphatase: 59 U/L (ref 38–126)
Anion gap: 6 (ref 5–15)
BUN: 11 mg/dL (ref 6–20)
CO2: 21 mmol/L — ABNORMAL LOW (ref 22–32)
Calcium: 8.4 mg/dL — ABNORMAL LOW (ref 8.9–10.3)
Chloride: 110 mmol/L (ref 98–111)
Creatinine, Ser: 0.81 mg/dL (ref 0.44–1.00)
GFR, Estimated: >60 mL/min (ref >60)
Glucose, Bld: 90 mg/dL (ref 70–99)
Potassium: 3.7 mmol/L (ref 3.5–5.1)
Sodium: 137 mmol/L (ref 135–145)
Total Bilirubin: 0.4 mg/dL (ref 0.3–1.2)
Total Protein: 7.1 g/dL (ref 6.5–8.1)

## 2021-08-13 LAB — TYPE AND SCREEN
ABO/RH(D): B POS
Antibody Screen: NEGATIVE

## 2021-08-13 LAB — I-STAT BETA HCG BLOOD, ED (MC, WL, AP ONLY): I-stat hCG, quantitative: <5 m[IU]/mL (ref <5)

## 2021-08-13 LAB — LIPASE, BLOOD: Lipase: 30 U/L (ref 11–51)

## 2021-08-13 MED ORDER — HYDROCODONE-ACETAMINOPHEN 5-325 MG PO TABS
1.0000 | ORAL_TABLET | Freq: Once | ORAL | Status: AC
  Administered 2021-08-13: 1 via ORAL
  Filled 2021-08-13: qty 1

## 2021-08-13 MED ORDER — HYDROCODONE-ACETAMINOPHEN 5-325 MG PO TABS: 2.0000 | ORAL_TABLET | ORAL | 0 refills | Status: DC | PRN

## 2021-08-13 NOTE — ED Provider Notes (Signed)
?[facility] DEPT ?Provider Note ? ? ?CSN: [id number] ?Arrival date & time: 08/13/21  0851 ? ?  ? ?History ? ?Chief Complaint  ?Patient presents with  ? Vaginal Bleeding  ? ? ?Jamie [name] is a 31 y.o. female. ? ?HPI ? ?  ? ?31yo female [id number], with history per her report type 1 diabetes, seizure, asthma, and anemia who presents with concern for vaginal bleeding and abdominal cramping. ? ?Reports that she has had significant bleeding for the last 1 month and a half.  Reports that she is soaking a regular pad every 5 minutes for a month and a half.  She reports that she has been regularly doing this, sometimes wears a tampon and a pad and soaks through that.  Denies having a break with the bleeding in the last month reports it has been of the severity for the last month and a half.  She had of his abdominal cramping associated with it and specifically right lower quadrant abdominal pain for the last month and a half.  Reports she had a prior diagnosis of appendicitis a long time ago and not had surgery.  Denies fevers, nausea, vomiting, diarrhea or constipation.  Denies dysuria.  Notes cramping in the right lower quadrant with some radiation to the back. ? ?Per chart review she had been seen April 12 by the [facility] OB[facility]GYN office with concern for irregular menses, and at that point she had reported she either bleeds for a whole month for the past year skips a period.  She reported at that time before a year ago her periods are normal.  On my history, she does report that she had had normal menses up until a month and a half ago.  She had labs recommended including TSH, prolactin, CBC, GC/chlamydia.  She was given a prescription for Provera.  Labs at that time were normal with exception of CBC showing hemoglobin of 9.9. ? ?Provera 13th of each month for 10 days, does not feel like it is helping ? ?Past Medical History:  ?Diagnosis Date  ? Anemia   ? Asthma   ? Bronchitis   ?  Diabetes mellitus without complication ([place])   ? per pt report type 1 diabetic  ? Mental disorder   ? depression  ? Seizure ([place])   ? ? ? ?Home Medications ?Prior to Admission medications   ?Medication Sig Start Date End Date Taking? Authorizing Provider  ?HYDROcodone-acetaminophen (NORCO/VICODIN) 5-325 MG tablet Take 2 tablets by mouth every 4 (four) hours as needed. 08/13/21  Yes Gareth Morgan, MD  ?acetaminophen (TYLENOL) 500 MG tablet Take 1 tablet (500 mg total) by mouth every 6 (six) hours as needed. ?Patient taking differently: Take 500 mg by mouth every 6 (six) hours as needed for mild pain. 07/07/20   Volney American, PA-C  ?albuterol [facility] HFA) 108 (90 Base) MCG/ACT inhaler Inhale 2 puffs into the lungs every 4 (four) hours as needed for wheezing or shortness of breath (or coughing). 11/24/19   Raylene Everts, MD  ?aspirin EC 81 MG tablet Take 81 mg by mouth daily. Swallow whole.    [provider]  ?folic acid (FOLVITE) 1 MG tablet Take 3 mg by mouth daily.    [provider]  ?levETIRAcetam (KEPPRA) 500 MG tablet Take 500 mg by mouth in the morning, at noon, and at bedtime.    [provider]  ?medroxyPROGESTERone (PROVERA) 10 MG tablet Take 1 tablet (10 mg total) by mouth  in the morning, at noon, and at bedtime. ?Patient not taking: Reported on 04/09/2021 12/01/20   Domenic Moras, PA-C  ?megestrol (MEGACE) 40 MG tablet Take 1 tablet (40 mg total) by mouth 2 (two) times daily. 06/15/21   [name], MD  ?Prenatal Vit-Fe Fumarate-FA (MULTIVITAMIN-PRENATAL) 27-0.8 MG TABS tablet Take 1 tablet by mouth daily at 12 noon.    [provider]  ?zonisamide (ZONEGRAN) 100 MG capsule Take 3 capsules (300 mg total) by mouth 3 (three) times daily. ?Patient taking differently: Take 100 mg by mouth 3 (three) times daily. 09/17/19 05/25/21  Truddie Hidden, MD  ?promethazine (PHENERGAN) 25 MG tablet Take 1 tablet (25 mg total) by mouth every 6 (six) hours as needed for  nausea or vomiting. ?Patient not taking: Reported on 04/09/2018 08/11/17 12/11/18  Estill Dooms, NP  ?   ? ?Allergies    ?Amoxicillin, Latex, Motrin [ibuprofen], and Penicillins   ? ?Review of Systems   ?Review of Systems ? ?Physical Exam ?Updated Vital Signs ?BP (!) 102/56 (BP Location: Left Arm)   Pulse 69   Temp 98 ?F (36.7 ?C) (Oral)   Resp 16   Ht '5\' 5"'$  (1.651 m)   Wt 83 kg   SpO2 100%   BMI 30.45 kg/m?  ?Physical Exam ?Vitals and nursing note reviewed.  ?Constitutional:   ?   General: She is not in acute distress. ?   Appearance: Normal appearance. She is not ill-appearing, toxic-appearing or diaphoretic.  ?HENT:  ?   Head: Normocephalic.  ?Eyes:  ?   Conjunctiva/sclera: Conjunctivae normal.  ?Cardiovascular:  ?   Rate and Rhythm: Normal rate and regular rhythm.  ?   Pulses: Normal pulses.  ?Pulmonary:  ?   Effort: Pulmonary effort is normal. No respiratory distress.  ?Abdominal:  ?   Tenderness: There is abdominal tenderness (RLQ, suprapubic).  ?Musculoskeletal:     ?   General: No deformity or signs of injury.  ?   Cervical back: No rigidity.  ?Skin: ?   General: Skin is warm and dry.  ?   Coloration: Skin is not jaundiced or pale.  ?Neurological:  ?   General: No focal deficit present.  ?   Mental Status: She is alert and oriented to person, place, and time.  ? ? ?ED Results / Procedures / Treatments   ?Labs ?(all labs ordered are listed, but only abnormal results are displayed) ?Labs Reviewed  ?CBC WITH DIFFERENTIAL/PLATELET - Abnormal; Notable for the following components:  ?    Result Value  ? Hemoglobin 10.8 (*)   ? HCT 35.6 (*)   ? MCH 24.5 (*)   ? RDW 17.9 (*)   ? All other components within normal limits  ?COMPREHENSIVE METABOLIC PANEL - Abnormal; Notable for the following components:  ? CO2 21 (*)   ? Calcium 8.4 (*)   ? All other components within normal limits  ?LIPASE, BLOOD  ?I-STAT BETA HCG BLOOD, ED ([facility], [facility], [facility] ONLY)  ?TYPE AND SCREEN  ? ? ?EKG ?None ? ?Radiology ?US Transvaginal  Non-OB ? ?Result Date: 08/13/2021 ?CLINICAL DATA:  Pain right lower quadrant, vaginal bleeding EXAM: TRANSABDOMINAL AND TRANSVAGINAL ULTRASOUND OF PELVIS DOPPLER ULTRASOUND OF OVARIES TECHNIQUE: Both transabdominal and transvaginal ultrasound examinations of the pelvis were performed. Transabdominal technique was performed for global imaging of the pelvis including uterus, ovaries, adnexal regions, and pelvic cul-de-sac. It was necessary to proceed with endovaginal exam following the transabdominal exam to visualize the endometrium and ovaries. Color and  duplex Doppler ultrasound was utilized to evaluate blood flow to the ovaries. COMPARISON:  06/15/2021 FINDINGS: Uterus Measurements: 11.5 x 5.1 x 5.6 cm = volume: 171.1 mL. There is inhomogeneous echogenicity in myometrium. 2.4 x 2.6 cm area of inhomogeneous echogenicity is seen in the uterine cervix, possibly fibroid. Uterus is retroverted. Endometrium Thickness: 7 mm.  No focal abnormality visualized. Right ovary Measurements: 7.4 x 4.7 x 7 cm = volume: 127.6 mL. There is new 5.7 x 5 cm cystic structure with low-level echoes in the right ovary. There are no thick septations or mural nodules. Left ovary Measurements: 3.9 x 2.1 x 3.7 cm = volume: 15.6 mL. Normal appearance/no adnexal mass. Color Doppler examination shows demonstrable vascular flow in both adnexal regions. Pulsed Doppler evaluation of both ovaries demonstrates normal low-resistance arterial and venous waveforms. Other findings No abnormal free fluid. IMPRESSION: There is inhomogeneous echogenicity in myometrium with possible 2.6 cm fibroid in the lower uterine segment. There is 5.7 cm cyst with low-level internal echoes suggesting possible hemorrhagic functional cyst in the right ovary. Electronically Signed   By: Elmer Picker M.D.   On: 08/13/2021 11:29  ? ?US Pelvis Complete ? ?Result Date: 08/13/2021 ?CLINICAL DATA:  Pain right lower quadrant, vaginal bleeding EXAM: TRANSABDOMINAL AND  TRANSVAGINAL ULTRASOUND OF PELVIS DOPPLER ULTRASOUND OF OVARIES TECHNIQUE: Both transabdominal and transvaginal ultrasound examinations of the pelvis were performed. Transabdominal technique was performed for global imaging of

## 2021-08-13 NOTE — ED Triage Notes (Addendum)
Patient c/o vaginal bleeding and states she has had a period 1 1/2 months. Patient states she has been having clots x 3 days and when they come out she had increased pain. Patient also c/o right flank , lower abdominal pain that radiates into the lower back. ?

## 2021-08-14 NOTE — ED Notes (Signed)
Pt was never discharged from previous shift. Will discharge. ?

## 2021-08-27 ENCOUNTER — Other Ambulatory Visit: Payer: Self-pay

## 2021-08-27 ENCOUNTER — Emergency Department (HOSPITAL_COMMUNITY)
Admission: EM | Admit: 2021-08-27 | Discharge: 2021-08-27 | Disposition: A | Discharge status: Home / Self Care | Payer: Medicaid Other | Attending: Emergency Medicine | Admitting: Emergency Medicine

## 2021-08-27 DIAGNOSIS — Z7982 Long term (current) use of aspirin: Secondary | ICD-10-CM | POA: Insufficient documentation

## 2021-08-27 DIAGNOSIS — N939 Abnormal uterine and vaginal bleeding, unspecified: Secondary | ICD-10-CM | POA: Insufficient documentation

## 2021-08-27 DIAGNOSIS — Z91040 Latex allergy status: Secondary | ICD-10-CM | POA: Insufficient documentation

## 2021-08-27 LAB — BASIC METABOLIC PANEL
Anion gap: 5 (ref 5–15)
BUN: 9 mg/dL (ref 6–20)
CO2: 22 mmol/L (ref 22–32)
Calcium: 8.1 mg/dL — ABNORMAL LOW (ref 8.9–10.3)
Chloride: 111 mmol/L (ref 98–111)
Creatinine, Ser: 0.88 mg/dL (ref 0.44–1.00)
GFR, Estimated: >60 mL/min (ref >60)
Glucose, Bld: 108 mg/dL — ABNORMAL HIGH (ref 70–99)
Potassium: 3.7 mmol/L (ref 3.5–5.1)
Sodium: 138 mmol/L (ref 135–145)

## 2021-08-27 LAB — CBC WITH DIFFERENTIAL/PLATELET
Abs Immature Granulocytes: 0.02 10*3/uL (ref 0.00–0.07)
Basophils Absolute: 0.1 10*3/uL (ref 0.0–0.1)
Basophils Relative: 1 %
Eosinophils Absolute: 0.2 10*3/uL (ref 0.0–0.5)
Eosinophils Relative: 2 %
HCT: 25.8 % — ABNORMAL LOW (ref 36.0–46.0)
Hemoglobin: 7.9 g/dL — ABNORMAL LOW (ref 12.0–15.0)
Immature Granulocytes: 0 %
Lymphocytes Relative: 33 %
Lymphs Abs: 2.1 10*3/uL (ref 0.7–4.0)
MCH: 24.3 pg — ABNORMAL LOW (ref 26.0–34.0)
MCHC: 30.6 g/dL (ref 30.0–36.0)
MCV: 79.4 fL — ABNORMAL LOW (ref 80.0–100.0)
Monocytes Absolute: 0.5 10*3/uL (ref 0.1–1.0)
Monocytes Relative: 8 %
Neutro Abs: 3.5 10*3/uL (ref 1.7–7.7)
Neutrophils Relative %: 56 %
Platelets: 395 10*3/uL (ref 150–400)
RBC: 3.25 MIL/uL — ABNORMAL LOW (ref 3.87–5.11)
RDW: 17.3 % — ABNORMAL HIGH (ref 11.5–15.5)
WBC: 6.4 10*3/uL (ref 4.0–10.5)
nRBC: 0 % (ref 0.0–0.2)

## 2021-08-27 LAB — I-STAT BETA HCG BLOOD, ED (MC, WL, AP ONLY): I-stat hCG, quantitative: <5 m[IU]/mL (ref <5)

## 2021-08-27 MED ORDER — MEGESTROL ACETATE 40 MG PO TABS
120.0000 mg | ORAL_TABLET | Freq: Once | ORAL | Status: AC
  Administered 2021-08-27: 120 mg via ORAL
  Filled 2021-08-27: qty 3

## 2021-08-27 MED ORDER — ESTROGENS CONJUGATED 25 MG IJ SOLR
25.0000 mg | Freq: Once | INTRAMUSCULAR | Status: AC
  Administered 2021-08-27: 25 mg via INTRAVENOUS
  Filled 2021-08-27: qty 25

## 2021-08-27 MED ORDER — MEGESTROL ACETATE 40 MG PO TABS: ORAL_TABLET | ORAL | 0 refills | Status: AC

## 2021-08-27 MED ORDER — SODIUM CHLORIDE 0.9 % IV BOLUS
1000.0000 mL | Freq: Once | INTRAVENOUS | Status: AC
  Administered 2021-08-27: 1000 mL via INTRAVENOUS

## 2021-08-27 NOTE — Discharge Instructions (Signed)
Please schedule a follow up visit with [facility].  ?I have prescribed you a Megestrol taper. Please take as prescribed. You will start by taking three pills daily, then taper down to two pills daily, until you are just taking one pill a day. You will be taking this for one month. Once you are finished with the coarse, you will ideally get on a long term birth control pill to keep your cycles regulated. Please set up an appointment with OBGYN prior to finishing taper. ? ?Please return to the ER if you are not noticing any decrease in the amount you're bleeding within 5 days. If you are having worsening weakness, dizziness, or pass out, please return to the Emergency Department for repeat labs so that we can check your hemoglobin level.    ?

## 2021-08-27 NOTE — ED Provider Notes (Signed)
?[facility] ?Provider Note ? ? ?CSN: [id number] ?Arrival date & time: 08/27/21  0747 ? ?  ? ?History ?PMH: DM 1, asthma ?Chief Complaint  ?Patient presents with  ? Vaginal Bleeding  ? Weakness  ? ? ?Jamie [name] is a 31 y.o. female.  Patient presents the emergency room with a chief complaint of vaginal bleeding.  She states she has had abnormal heavy vaginal bleeding for 2 months now.  She was seen by an OB/GYN on April 12 and was prescribed Provera.  Continue to believe while she was on this and ended up going to the emergency room on May 2.  That point her hemoglobin was found to be 10.8. she has had an ultrasound done that has revealed a fibroid.  She then followed up with her OB/GYN on May 3 and was started on another birth control pill called norgestimate ethinyl estradiol.  Following this, she continued to have heavy bleeding.  She called her OB/GYN office on May 12 and they placed her on Lysteda.  She has since finished this and has had continued vaginal bleeding.  She feels like it is only getting heavier.  She says she is bleeding large clots and is went through 5 packs of pads.  She says she started to feel generally weak and feels like she is going to pass out when she is walking around.  She is also having associated pelvic cramping. ? ? ?Vaginal Bleeding ?Weakness ? ?  ? ?Home Medications ?Prior to Admission medications   ?Medication Sig Start Date End Date Taking? Authorizing Provider  ?acetaminophen (TYLENOL) 500 MG tablet Take 1 tablet (500 mg total) by mouth every 6 (six) hours as needed. ?Patient taking differently: Take 500 mg by mouth every 6 (six) hours as needed for mild pain. 07/07/20  Yes Volney American, PA-C  ?albuterol [facility]) 108 (90 Base) MCG/ACT inhaler Inhale 2 puffs into the lungs every 4 (four) hours as needed for wheezing or shortness of breath (or coughing). 11/24/19  Yes Raylene Everts, MD  ?aspirin EC 81 MG tablet Take 81  mg by mouth daily. Swallow whole.   Yes [provider]  ?citalopram (CELEXA) 20 MG tablet Take 30 mg by mouth daily. 08/26/21  Yes [provider]  ?folic acid (FOLVITE) 1 MG tablet Take 3 mg by mouth daily.   Yes [provider]  ?levETIRAcetam (KEPPRA) 500 MG tablet Take 500 mg by mouth in the morning, at noon, and at bedtime.   Yes [provider]  ?megestrol (MEGACE) 40 MG tablet Take 3 tablets (120 mg total) by mouth daily for 5 days, THEN 2 tablets (80 mg total) daily for 5 days, THEN 1 tablet (40 mg total) daily for 20 days. 08/27/21 09/26/21 Yes Taiesha Bovard, Adora Fridge, PA-C  ?Prenatal Vit-Fe Fumarate-FA (MULTIVITAMIN-PRENATAL) 27-0.8 MG TABS tablet Take 1 tablet by mouth daily at 12 noon.   Yes [provider]  ?QUEtiapine (SEROQUEL) 100 MG tablet Take 150 mg by mouth at bedtime. 08/26/21  Yes [provider]  ?zonisamide (ZONEGRAN) 100 MG capsule Take 3 capsules (300 mg total) by mouth 3 (three) times daily. ?Patient taking differently: Take 100 mg by mouth 3 (three) times daily. 09/17/19 05/25/21  Truddie Hidden, MD  ?promethazine (PHENERGAN) 25 MG tablet Take 1 tablet (25 mg total) by mouth every 6 (six) hours as needed for nausea or vomiting. ?Patient not taking: Reported on 04/09/2018 08/11/17 12/11/18  Estill Dooms, NP  ?   ? ?  Allergies    ?Amoxicillin, Latex, Motrin [ibuprofen], and Penicillins   ? ?Review of Systems   ?Review of Systems  ?Genitourinary:  Positive for pelvic pain and vaginal bleeding.  ?Neurological:  Positive for weakness.  ?     Near syncope  ?All other systems reviewed and are negative. ? ?Physical Exam ?Updated Vital Signs ?BP 101/65   Pulse 72   Temp 99 ?F (37.2 ?C) (Oral)   Resp 15   Ht '5\' 5"'$  (1.651 m)   Wt 79.4 kg   LMP 08/01/2021   SpO2 100%   BMI 29.12 kg/m?  ?Physical Exam ?Vitals and nursing note reviewed.  ?Constitutional:   ?   General: She is not in acute distress. ?   Appearance: Normal appearance. She is  well-developed. She is not ill-appearing, toxic-appearing or diaphoretic.  ?HENT:  ?   Head: Normocephalic and atraumatic.  ?   Nose: No nasal deformity.  ?   Mouth/Throat:  ?   Lips: Pink. No lesions.  ?Eyes:  ?   General: Gaze aligned appropriately. No scleral icterus.    ?   Right eye: No discharge.     ?   Left eye: No discharge.  ?   Conjunctiva/sclera: Conjunctivae normal.  ?   Right eye: Right conjunctiva is not injected. No exudate or hemorrhage. ?   Left eye: Left conjunctiva is not injected. No exudate or hemorrhage. ?Cardiovascular:  ?   Pulses: Normal pulses.  ?Pulmonary:  ?   Effort: Pulmonary effort is normal. No respiratory distress.  ?Abdominal:  ?   General: Abdomen is flat. There is no distension.  ?   Palpations: Abdomen is soft.  ?   Tenderness: There is abdominal tenderness. There is no right CVA tenderness, left CVA tenderness, guarding or rebound.  ?   Comments: Moderate tenderness to the pelvic region on bilateral sides. No guarding or rigidity.  ?Skin: ?   General: Skin is warm and dry.  ?Neurological:  ?   Mental Status: She is alert and oriented to person, place, and time.  ?Psychiatric:     ?   Mood and Affect: Mood normal.     ?   Speech: Speech normal.     ?   Behavior: Behavior normal. Behavior is cooperative.  ? ? ?ED Results / Procedures / Treatments   ?Labs ?(all labs ordered are listed, but only abnormal results are displayed) ?Labs Reviewed  ?CBC WITH DIFFERENTIAL/PLATELET - Abnormal; Notable for the following components:  ?    Result Value  ? RBC 3.25 (*)   ? Hemoglobin 7.9 (*)   ? HCT 25.8 (*)   ? MCV 79.4 (*)   ? MCH 24.3 (*)   ? RDW 17.3 (*)   ? All other components within normal limits  ?BASIC METABOLIC PANEL - Abnormal; Notable for the following components:  ? Glucose, Bld 108 (*)   ? Calcium 8.1 (*)   ? All other components within normal limits  ?I-STAT BETA HCG BLOOD, ED ([facility], [facility], [facility] ONLY)  ? ? ?EKG ?None ? ?Radiology ?No results found. ? ?Procedures ?Procedures   ? ? ?Medications Ordered in ED ?Medications  ?conjugated estrogens (PREMARIN) injection 25 mg (25 mg Intravenous Given 08/27/21 1237)  ?megestrol (MEGACE) tablet 120 mg (120 mg Oral Given 08/27/21 1238)  ?sodium chloride 0.9 % bolus 1,000 mL (1,000 mLs Intravenous New Bag/Given 08/27/21 1217)  ? ? ?ED Course/ Medical Decision Making/ A&P ?  ? ?                        ?  Medical Decision Making ?Amount and/or Complexity of Data Reviewed ?Labs: ordered. ? ?Risk ?Prescription drug management. ? ? ? ?MDM  ?This is a 31 y.o. female who presents to the ED with abnormal uterine bleeding ? ?My Impression, Plan, and ED Course: Presents with ongoing vaginal bleeding.  She has a fibroid that is likely causing the bleeding.  She has been on multiple agents including Provera, another combo birth control pill, and Lysteda.  She is only had increased vaginal bleeding since then and is now having associated weakness, near syncopal events, and dizziness. ? ?I personally ordered, reviewed, and interpreted all laboratory work and imaging and agree with radiologist interpretation. Results interpreted below: CBC reveals a hemoglobin of 7.9 down from 10.8 ago.  Her BMP is reassuring.  She is not pregnant today. ? ?Starting with the profound amount of blood loss that this patient has.  She is not yet to the point of needing blood transfusion, but she had a three-point drop in her hemoglobin over the past 2 weeks.  She has been on 3 different agents with no success.  Going to consult OB/GYN to see their recommendations. ? ?'@1207'$ , I have discussed this case with OBGYN, Dr. Elonda Husky. Recommends 25 mg IV Premarin as well as 120 mg Megace here. Recommends home with Megace taper. 120 mg for 5 days, 80 mg for 5 days, and 40 mg for 20 days. Then recommends starting on birth control pills at follow up visit. Does not think admission required as this plan should stop the bleeding and she does not have transfusion needs at this time. ? ?I discussed the plan  with the patient. She does not wish to return to her OBGYN so I will refer her to one of ours in the area. I discussed that if her bleeding does not improve within 3-5 days or she has worsening weakness, she needs to return

## 2021-08-27 NOTE — ED Triage Notes (Signed)
Pt. Stated Ive had bleeding for 2 months and it s making me weak and feeling like Im going to pass out. ?

## 2021-09-11 ENCOUNTER — Other Ambulatory Visit: Payer: Self-pay

## 2021-09-11 ENCOUNTER — Encounter (HOSPITAL_COMMUNITY): Payer: Self-pay | Admitting: Emergency Medicine

## 2021-09-11 ENCOUNTER — Emergency Department (HOSPITAL_COMMUNITY)
Admission: EM | Admit: 2021-09-11 | Discharge: 2021-09-11 | Discharge status: Against Medical Advice | Payer: Medicaid Other | Attending: Emergency Medicine | Admitting: Emergency Medicine

## 2021-09-11 DIAGNOSIS — Z7982 Long term (current) use of aspirin: Secondary | ICD-10-CM | POA: Insufficient documentation

## 2021-09-11 DIAGNOSIS — Z91040 Latex allergy status: Secondary | ICD-10-CM | POA: Insufficient documentation

## 2021-09-11 DIAGNOSIS — Z77098 Contact with and (suspected) exposure to other hazardous, chiefly nonmedicinal, chemicals: Secondary | ICD-10-CM

## 2021-09-11 DIAGNOSIS — H538 Other visual disturbances: Secondary | ICD-10-CM | POA: Diagnosis present

## 2021-09-11 MED ORDER — TETRACAINE HCL 0.5 % OP SOLN
2.0000 [drp] | Freq: Once | OPHTHALMIC | Status: DC
  Filled 2021-09-11: qty 4

## 2021-09-11 MED ORDER — FLUORESCEIN SODIUM 1 MG OP STRP
1.0000 | ORAL_STRIP | Freq: Once | OPHTHALMIC | Status: DC
  Filled 2021-09-11: qty 1

## 2021-09-11 NOTE — ED Provider Notes (Signed)
[facility] DEPT Provider Note   CSN: [id number] Arrival date & time: 09/11/21  1418     History  Chief Complaint  Patient presents with   Chemical Exposure    Jamie [name] is a 31 y.o. female.  31 y.o female with no PMH presents to the ED with a chief complaint of bilateral eye burning and vision changes.  Patient reports she was cleaning with ammonia and bleach when mixing this 2 together, she accidentally splashed herself in the eyes.  She has water in her rag, placed them around her eyes, and began to irrigate her eyes but there was not much improvement in vision.  She also endorses a headache, this is generalized constant pain, states pain has not improved since this began.  She has been alternating Tylenol, ibuprofen but there has not been much improvement in symptoms.  Denies any loss of visions, denies any nausea, vomiting.  The history is provided by the patient and medical records.      Home Medications Prior to Admission medications   Medication Sig Start Date End Date Taking? Authorizing Provider  acetaminophen (TYLENOL) 500 MG tablet Take 1 tablet (500 mg total) by mouth every 6 (six) hours as needed. Patient taking differently: Take 500 mg by mouth every 6 (six) hours as needed for mild pain. 07/07/20   Volney American, PA-C  albuterol [facility]) [phone] Base) MCG/ACT inhaler Inhale 2 puffs into the lungs every 4 (four) hours as needed for wheezing or shortness of breath (or coughing). 11/24/19   Raylene Everts, MD  aspirin EC 81 MG tablet Take 81 mg by mouth daily. Swallow whole.    [provider]  citalopram (CELEXA) 20 MG tablet Take 30 mg by mouth daily. 08/26/21   [provider]  folic acid (FOLVITE) 1 MG tablet Take 3 mg by mouth daily.    [provider]  levETIRAcetam (KEPPRA) 500 MG tablet Take 500 mg by mouth in the morning, at noon, and at bedtime.    [provider]  megestrol  (MEGACE) 40 MG tablet Take 3 tablets (120 mg total) by mouth daily for 5 days, THEN 2 tablets (80 mg total) daily for 5 days, THEN 1 tablet (40 mg total) daily for 20 days. 08/27/21 09/26/21  Loeffler, Adora Fridge, PA-C  Prenatal Vit-Fe Fumarate-FA (MULTIVITAMIN-PRENATAL) 27-0.8 MG TABS tablet Take 1 tablet by mouth daily at 12 noon.    [provider]  QUEtiapine (SEROQUEL) 100 MG tablet Take 150 mg by mouth at bedtime. 08/26/21   [provider]  zonisamide (ZONEGRAN) 100 MG capsule Take 3 capsules (300 mg total) by mouth 3 (three) times daily. Patient taking differently: Take 100 mg by mouth 3 (three) times daily. 09/17/19 05/25/21  Truddie Hidden, MD  promethazine (PHENERGAN) 25 MG tablet Take 1 tablet (25 mg total) by mouth every 6 (six) hours as needed for nausea or vomiting. Patient not taking: Reported on 04/09/2018 08/11/17 12/11/18  Derrek Monaco A, NP      Allergies    Amoxicillin, Latex, Motrin [ibuprofen], and Penicillins    Review of Systems   Review of Systems  Constitutional:  Negative for chills and fever.  Eyes:  Positive for pain.  Neurological:  Positive for headaches.   Physical Exam Updated Vital Signs BP 103/82 (BP Location: Right Arm)   Pulse 86   Temp 98.2 F (36.8 C) (Oral)   Resp 18   SpO2 100%  Physical Exam  Vitals and nursing note reviewed.  Constitutional:      Appearance: Normal appearance.  HENT:     Head: Normocephalic and atraumatic.     Mouth/Throat:     Mouth: Mucous membranes are dry.  Eyes:     Pupils: Pupils are equal, round, and reactive to light.  Cardiovascular:     Rate and Rhythm: Normal rate.  Pulmonary:     Effort: Pulmonary effort is normal.     Breath sounds: No wheezing.  Abdominal:     General: Abdomen is flat.  Musculoskeletal:     Cervical back: Normal range of motion and neck supple.  Skin:    General: Skin is warm and dry.  Neurological:     Mental Status: She is alert and oriented to person, place,  and time.     Visual Acuity  Right Eye Distance: 20/50 Left Eye Distance: 0 Bilateral Distance:    Right Eye Near:   Left Eye Near:    Bilateral Near:     ED Results / Procedures / Treatments   Labs (all labs ordered are listed, but only abnormal results are displayed) Labs Reviewed - No data to display  EKG None  Radiology No results found.  Procedures Procedures    Medications Ordered in ED Medications  fluorescein ophthalmic strip 1 strip (has no administration in time range)  tetracaine (PONTOCAINE) 0.5 % ophthalmic solution 2 drop (has no administration in time range)    ED Course/ Medical Decision Making/ A&P                           Medical Decision Making Risk Prescription drug management.   Patient presents to the ED after chemical exposure which occurred when she was mixing ammonia along with bleach, she reports this occurred approximately 2 days ago, since then she has continued to have blurry vision, headaches.  She has been taken Tylenol, "an entire bottle of ibuprofen" but there has not been improvement in symptoms.  Patient ambulated with a steady gait in the ED.  She did have a visual acuity, that reported no vision from her left eye.  However patient is actively texting in her room.  Patient did report that headache is generalized, without any weakness to arms or legs.  No prior trauma. She did not irrigate her eyes, she did try to put a wet cloth over her eyes. External visualization of her eyes, without any injection, no chemosis present.  Unremarkable EOMs, pupils are equal and reactive.  I tried to do a thorough exam with a [place] lamp, tetracaine, fluorescein staining however patient refused this.  I discussed with patient the importance of exam, how she would need to follow-up with ophthalmology if he continues to have vision loss.  She reports she does not want to do exam at any cost and will follow-up with ophthalmology.  At this point patient is  going to leave the emergency department [place].  Patient accompanied by mother at the bedside, refusing exam. AMA.   Portions of this note were generated with [employer]. Dictation errors may occur despite best attempts at proofreading.   Final Clinical Impression(s) / ED Diagnoses Final diagnoses:  Chemical exposure    Rx / DC Orders ED Discharge Orders     None         Janeece Fitting, PA-C 09/11/21 [place], DO 09/11/21 1617

## 2021-09-11 NOTE — Discharge Instructions (Signed)
You did not want to have an eye exam on todays visit.   You will need to follow up with eye doctor for further evaluation of your symptoms.

## 2021-09-11 NOTE — ED Triage Notes (Signed)
Pt reports mixing bleach and ammonia together while cleaning and reports now having blurry vision and headaches.

## 2021-10-16 NOTE — Progress Notes (Deleted)
   GYNECOLOGY OFFICE VISIT NOTE  History:  31 y.o. [id number] here today for ***. She denies any abnormal vaginal discharge, bleeding, pelvic pain or other concerns.   The following portions of the patient's history were reviewed and updated as appropriate: allergies, current medications, past family history, past medical history, past social history, past surgical history and problem list.   Health Maintenance:  Normal pap and negative HRHPV on ***.  Normal mammogram on ***.   Review of Systems:  Pertinent items noted in HPI ROS  Objective:  Physical Exam There were no vitals taken for this visit. Physical Exam  Labs and Imaging No results found for this or any previous visit (from the past 168 hour(s)). No results found.  Assessment & Plan:  1. Encounter to establish care ***  2. Cervical cancer screening ***  3. AUB *** - Discussed differential: i.e. secondary dysmenorrhea vs endometriosis vs other etiology - Discussed only way to tell definitively regarding endometriosis is either by US showing an endometrioma or by laparoscopy. Would advise against surgery unless other treatment options have failed or concerns regarding infertility. We removed the impact on [facility] on personal risk of endometriosis.  - Discussed treatment options: Motrin prn severe cramps along with tylenol, OCPs, Nuvaring, Depo, Nexplanon, Lng-IUD and other hormonal options i.e. Edward Qualia. We discussed the risks and benefits of OCPs, including long term benefits and risk of VTE with estrogen containing options. We discussed the role of PFPT.  - We also discussed possible prevention of progression of endometriosis if this is the cause with use of OCPs, although not definitive.  - We reviewed use of laparoscopy in the assistance of fertility if endometriosis but that would only be after she has tried for at least 6 months.  - At this time, she would like to do: {Blank mulitple:19196::"Hormonal therapy with  ***","Non-hormonal therapy with ***","PFPT","Surgical intervention","Expectant management"}    Routine preventative health maintenance measures emphasized. Please refer to After Visit Summary for other counseling recommendations.   No follow-ups on file.   Total face-to-face time with patient: {Blank single:19197::"10","15","20","25","30"} minutes.  Over 50% of encounter was spent on counseling and coordination of care.  Renard Matter, MD, MPH OB Fellow, [place] for [facility], [place]

## 2021-10-17 ENCOUNTER — Encounter: Payer: Medicaid Other | Admitting: Family Medicine

## 2021-10-17 DIAGNOSIS — Z7689 Persons encountering health services in other specified circumstances: Secondary | ICD-10-CM

## 2021-10-17 DIAGNOSIS — Z124 Encounter for screening for malignant neoplasm of cervix: Secondary | ICD-10-CM

## 2021-12-14 ENCOUNTER — Emergency Department (HOSPITAL_COMMUNITY)
Admission: EM | Admit: 2021-12-14 | Discharge: 2021-12-14 | Disposition: A | Discharge status: Home / Self Care | Payer: Medicaid Other | Attending: Emergency Medicine | Admitting: Emergency Medicine

## 2021-12-14 ENCOUNTER — Encounter (HOSPITAL_COMMUNITY): Payer: Self-pay | Admitting: Emergency Medicine

## 2021-12-14 ENCOUNTER — Emergency Department (HOSPITAL_COMMUNITY): Payer: Medicaid Other

## 2021-12-14 ENCOUNTER — Other Ambulatory Visit: Payer: Self-pay

## 2021-12-14 DIAGNOSIS — Z91040 Latex allergy status: Secondary | ICD-10-CM | POA: Diagnosis not present

## 2021-12-14 DIAGNOSIS — R072 Precordial pain: Secondary | ICD-10-CM | POA: Diagnosis not present

## 2021-12-14 DIAGNOSIS — M25552 Pain in left hip: Secondary | ICD-10-CM | POA: Diagnosis present

## 2021-12-14 DIAGNOSIS — W01198A Fall on same level from slipping, tripping and stumbling with subsequent striking against other object, initial encounter: Secondary | ICD-10-CM | POA: Insufficient documentation

## 2021-12-14 DIAGNOSIS — M79662 Pain in left lower leg: Secondary | ICD-10-CM | POA: Insufficient documentation

## 2021-12-14 DIAGNOSIS — Z7982 Long term (current) use of aspirin: Secondary | ICD-10-CM | POA: Insufficient documentation

## 2021-12-14 LAB — CBC
HCT: 36.4 % (ref 36.0–46.0)
Hemoglobin: 11 g/dL — ABNORMAL LOW (ref 12.0–15.0)
MCH: 22 pg — ABNORMAL LOW (ref 26.0–34.0)
MCHC: 30.2 g/dL (ref 30.0–36.0)
MCV: 72.8 fL — ABNORMAL LOW (ref 80.0–100.0)
Platelets: 334 10*3/uL (ref 150–400)
RBC: 5 MIL/uL (ref 3.87–5.11)
RDW: 19.2 % — ABNORMAL HIGH (ref 11.5–15.5)
WBC: 7.6 10*3/uL (ref 4.0–10.5)
nRBC: 0 % (ref 0.0–0.2)

## 2021-12-14 LAB — BASIC METABOLIC PANEL
Anion gap: 14 (ref 5–15)
BUN: 13 mg/dL (ref 6–20)
CO2: 20 mmol/L — ABNORMAL LOW (ref 22–32)
Calcium: 9.3 mg/dL (ref 8.9–10.3)
Chloride: 106 mmol/L (ref 98–111)
Creatinine, Ser: 0.96 mg/dL (ref 0.44–1.00)
GFR, Estimated: >60 mL/min (ref >60)
Glucose, Bld: 158 mg/dL — ABNORMAL HIGH (ref 70–99)
Potassium: 3.9 mmol/L (ref 3.5–5.1)
Sodium: 140 mmol/L (ref 135–145)

## 2021-12-14 LAB — I-STAT BETA HCG BLOOD, ED (MC, WL, AP ONLY): I-stat hCG, quantitative: <5 m[IU]/mL (ref <5)

## 2021-12-14 LAB — TROPONIN I (HIGH SENSITIVITY): Troponin I (High Sensitivity): 3 ng/L (ref <18)

## 2021-12-14 MED ORDER — METHOCARBAMOL 500 MG PO TABS
500.0000 mg | ORAL_TABLET | Freq: Four times a day (QID) | ORAL | 0 refills | Status: AC
Start: 2021-12-14 — End: ?

## 2021-12-14 NOTE — ED Provider Notes (Signed)
[facility] EMERGENCY DEPARTMENT Provider Note   CSN: [id number] Arrival date & time: 12/14/21  1304     History  Chief Complaint  Patient presents with   Chest Pain   Hip Pain    Jamie [name] is a 31 y.o. female.  Pt reports she fell 2 days ago and hit her hip.  Pt reports pain with walking on left hip   The history is provided by the patient. A language interpreter was used.  Chest Pain Pain location:  Substernal area Pain quality: aching   Pain radiates to:  Does not radiate Onset quality:  Gradual Timing:  Constant Progression:  Worsening Chronicity:  New Relieved by:  Nothing Worsened by:  Nothing Ineffective treatments:  None tried Associated symptoms: no abdominal pain   Hip Pain Associated symptoms include chest pain. Pertinent negatives include no abdominal pain.       Home Medications Prior to Admission medications   Medication Sig Start Date End Date Taking? Authorizing Provider  methocarbamol (ROBAXIN) 500 MG tablet Take 1 tablet (500 mg total) by mouth 4 (four) times daily. 12/14/21  Yes Fransico Meadow, PA-C  acetaminophen (TYLENOL) 500 MG tablet Take 1 tablet (500 mg total) by mouth every 6 (six) hours as needed. Patient taking differently: Take 500 mg by mouth every 6 (six) hours as needed for mild pain. 07/07/20   Volney American, PA-C  albuterol [facility]) 108 [phone] Base) MCG/ACT inhaler Inhale 2 puffs into the lungs every 4 (four) hours as needed for wheezing or shortness of breath (or coughing). 11/24/19   Raylene Everts, MD  aspirin EC 81 MG tablet Take 81 mg by mouth daily. Swallow whole.    [provider]  citalopram (CELEXA) 20 MG tablet Take 30 mg by mouth daily. 08/26/21   [provider]  folic acid (FOLVITE) 1 MG tablet Take 3 mg by mouth daily.    [provider]  levETIRAcetam (KEPPRA) 500 MG tablet Take 500 mg by mouth in the morning, at noon, and at bedtime.    [provider]  Prenatal Vit-Fe Fumarate-FA (MULTIVITAMIN-PRENATAL) 27-0.8 MG TABS tablet Take 1 tablet by mouth daily at 12 noon.    [provider]  QUEtiapine (SEROQUEL) 100 MG tablet Take 150 mg by mouth at bedtime. 08/26/21   [provider]  zonisamide (ZONEGRAN) 100 MG capsule Take 3 capsules (300 mg total) by mouth 3 (three) times daily. Patient taking differently: Take 100 mg by mouth 3 (three) times daily. 09/17/19 05/25/21  Truddie Hidden, MD  promethazine (PHENERGAN) 25 MG tablet Take 1 tablet (25 mg total) by mouth every 6 (six) hours as needed for nausea or vomiting. Patient not taking: Reported on 04/09/2018 08/11/17 12/11/18  Derrek Monaco A, NP      Allergies    Amoxicillin, Latex, Motrin [ibuprofen], and Penicillins    Review of Systems   Review of Systems  Cardiovascular:  Positive for chest pain.  Gastrointestinal:  Negative for abdominal pain.  All other systems reviewed and are negative.   Physical Exam Updated Vital Signs BP (!) 119/105 (BP Location: Right Arm)   Pulse (!) 110   Temp 100 F (37.8 C) (Oral)   Resp 16   Ht '5\' 5"'$  (1.651 m)   Wt 79 kg   SpO2 100%   BMI 28.98 kg/m  Physical Exam Vitals reviewed.  Constitutional:      Appearance: She is well-developed.  Cardiovascular:  Rate and Rhythm: Normal rate and regular rhythm.     Heart sounds: Normal heart sounds.  Pulmonary:     Effort: Pulmonary effort is normal.     Breath sounds: Normal breath sounds.  Abdominal:     Palpations: Abdomen is soft.  Musculoskeletal:        General: Normal range of motion.     Cervical back: Normal range of motion.     Left lower leg: Tenderness present.  Skin:    General: Skin is warm.  Neurological:     General: No focal deficit present.     Mental Status: She is alert.  Psychiatric:        Mood and Affect: Mood normal.     ED Results / Procedures / Treatments   Labs (all labs ordered are listed, but only abnormal results  are displayed) Labs Reviewed  BASIC METABOLIC PANEL - Abnormal; Notable for the following components:      Result Value   CO2 20 (*)    Glucose, Bld 158 (*)    All other components within normal limits  CBC - Abnormal; Notable for the following components:   Hemoglobin 11.0 (*)    MCV 72.8 (*)    MCH 22.0 (*)    RDW 19.2 (*)    All other components within normal limits  I-STAT BETA HCG BLOOD, ED ([facility], [facility], [facility] ONLY)  TROPONIN I (HIGH SENSITIVITY)  TROPONIN I (HIGH SENSITIVITY)    EKG None  Radiology DG Hip Unilat W or Wo Pelvis 2-3 Views Left  Result Date: 12/14/2021 CLINICAL DATA:  Fall, pain EXAM: DG HIP (WITH OR WITHOUT PELVIS) 2-3V LEFT COMPARISON:  None Available. FINDINGS: There is no evidence of displaced hip fracture or dislocation. There is no evidence of arthropathy or other focal bone abnormality. IMPRESSION: No displaced fracture or dislocation of the left hip. Joint spaces are preserved. Electronically Signed   By: Delanna Ahmadi M.D.   On: 12/14/2021 15:09   DG Chest 2 View  Result Date: 12/14/2021 CLINICAL DATA:  Chest pain.  Shortness of breath. EXAM: CHEST - 2 VIEW COMPARISON:  None Available. FINDINGS: The heart size and mediastinal contours are within normal limits. Both lungs are clear. The visualized skeletal structures are unremarkable. IMPRESSION: No active cardiopulmonary disease. Electronically Signed   By: Dorise Bullion III M.D.   On: 12/14/2021 13:52    Procedures Procedures    Medications Ordered in ED Medications - No data to display  ED Course/ Medical Decision Making/ A&P                           Medical Decision Making Pt complains of left hip pain after falling 2 days ago.  Pt reports soreness in her chest for over a week   Amount and/or Complexity of Data Reviewed Labs: ordered. Decision-making details documented in ED Course.    Details: Labs ordered reviewed and interpreted.  Radiology: ordered and independent interpretation performed.  Decision-making details documented in ED Course.    Details: Chest xray  no acute abnormality  ECG/medicine tests: ordered and independent interpretation performed. Decision-making details documented in ED Course.    Details: EKG  no acute   Risk Prescription drug management. Risk Details: Pt counseled on results.  Pt given rx for robaxin           Final Clinical Impression(s) / ED Diagnoses Final diagnoses:  Left hip pain    Rx /  DC Orders ED Discharge Orders          Ordered    methocarbamol (ROBAXIN) 500 MG tablet  4 times daily        12/14/21 1518          An After Visit Summary was printed and given to the patient.     Sidney Ace 12/14/21 1527    Godfrey Pick, MD 12/14/21 [id number]

## 2021-12-14 NOTE — ED Notes (Signed)
Patient transported to X-ray 

## 2021-12-14 NOTE — ED Triage Notes (Signed)
Pt endorses chest congestion x3 weeks and left hip pain since falling on Friday. SOB with exertion and ankle swelling.

## 2021-12-14 NOTE — Discharge Instructions (Addendum)
Tylenol every 4 hours.  Return if any problems.  

## 2021-12-19 ENCOUNTER — Other Ambulatory Visit: Payer: Self-pay | Admitting: Adult Medicine

## 2021-12-19 DIAGNOSIS — N6310 Unspecified lump in the right breast, unspecified quadrant: Secondary | ICD-10-CM

## 2022-06-25 IMAGING — US US PELVIS COMPLETE
1 series · 14 of 25 positions shown · non-contrast
Comparison: None.

CLINICAL DATA: Menorrhagia.

EXAM:
TRANSABDOMINAL ULTRASOUND OF PELVIS
TECHNIQUE: Transabdominal ultrasound examination of the pelvis was performed
including evaluation of the uterus, ovaries, adnexal regions, and
pelvic cul-de-sac.

[Series 1: us pelvis complete · 14 of 56 slices shown]
[im 1/56]
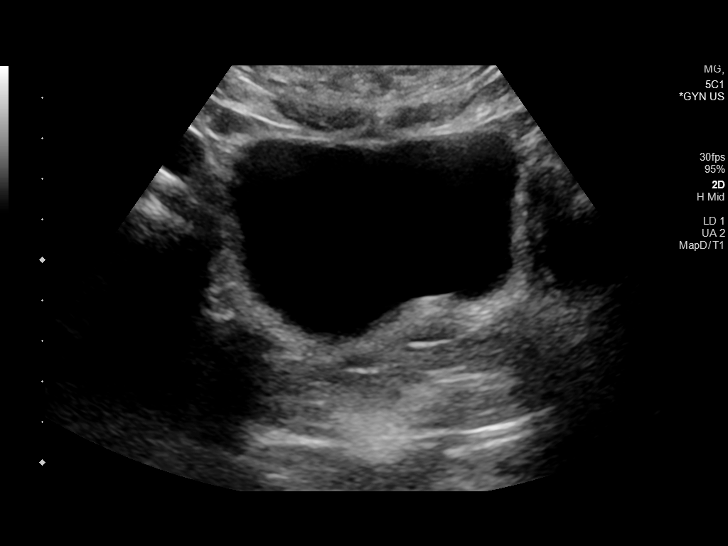
[im 5/56]
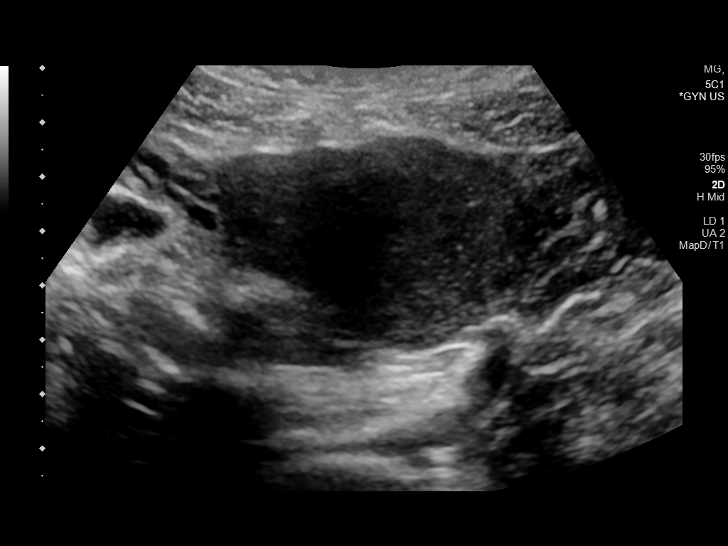
[im 10/56]
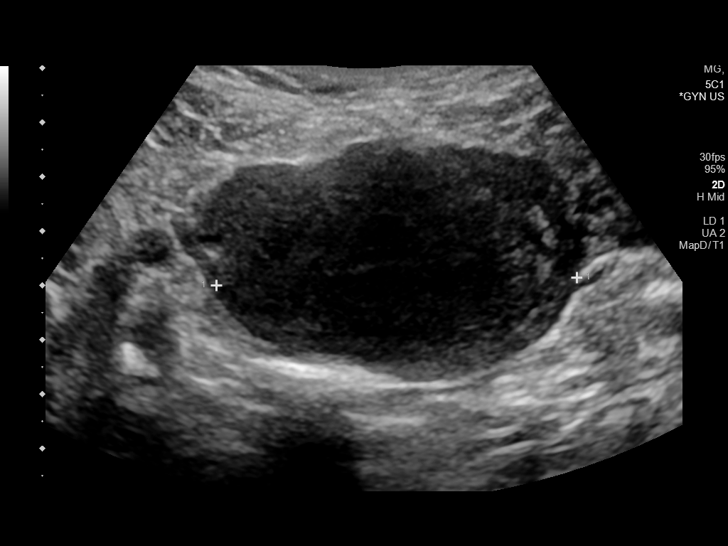
[im 14/56]
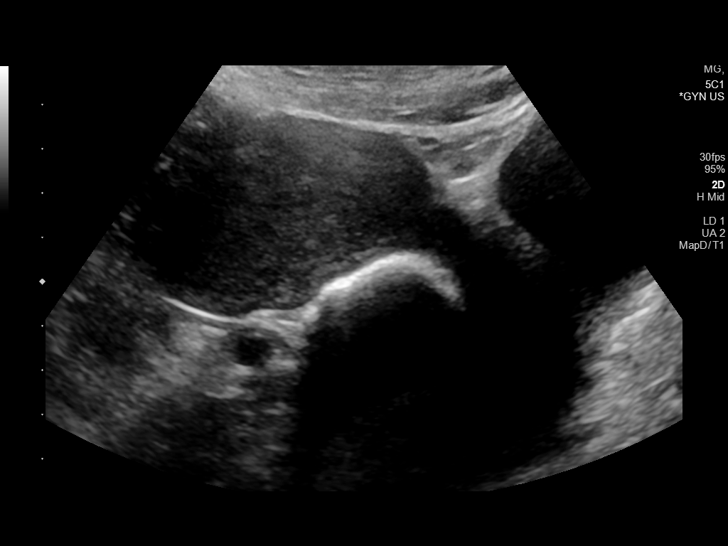
[im 19/56]
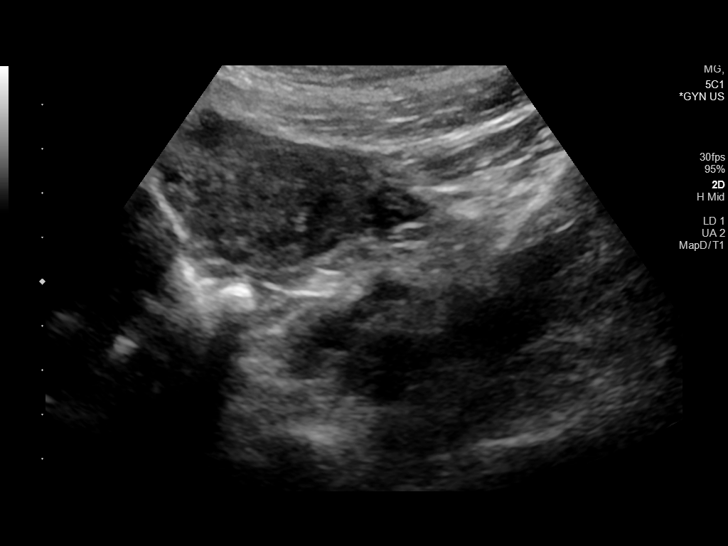
[im 21/56]
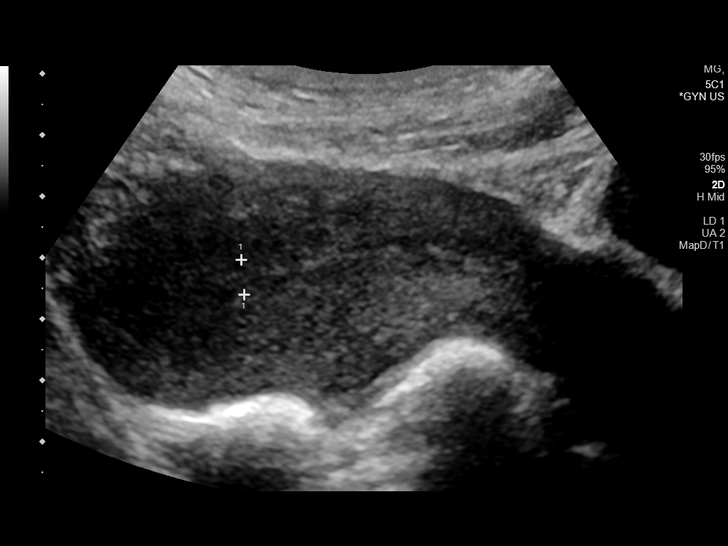
[im 26/56]
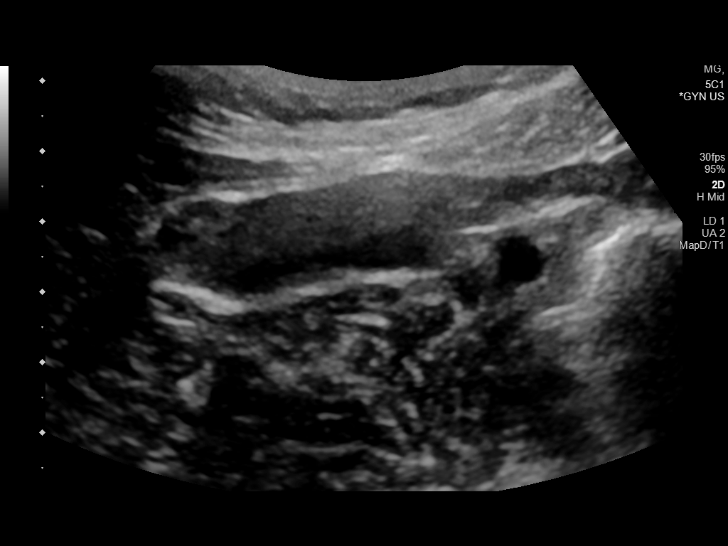
[im 30/56]
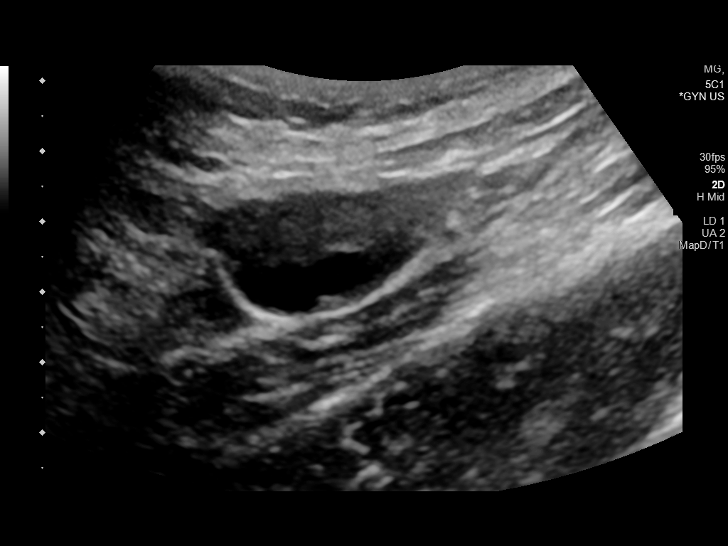
[im 35/56]
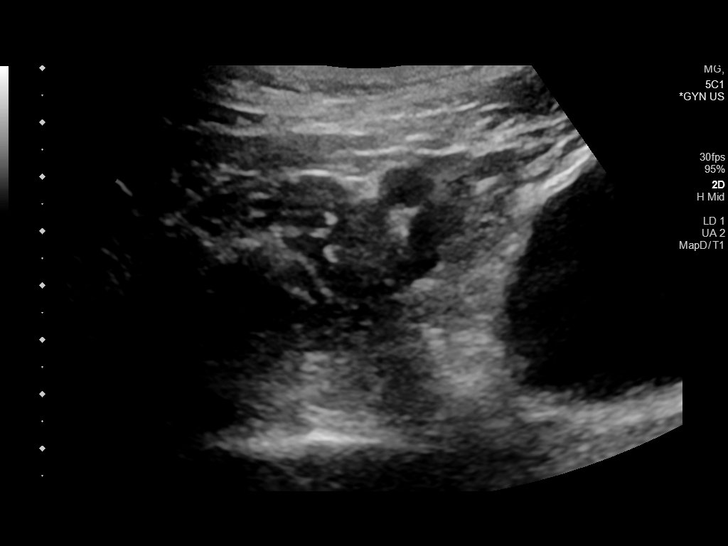
[im 37/56]
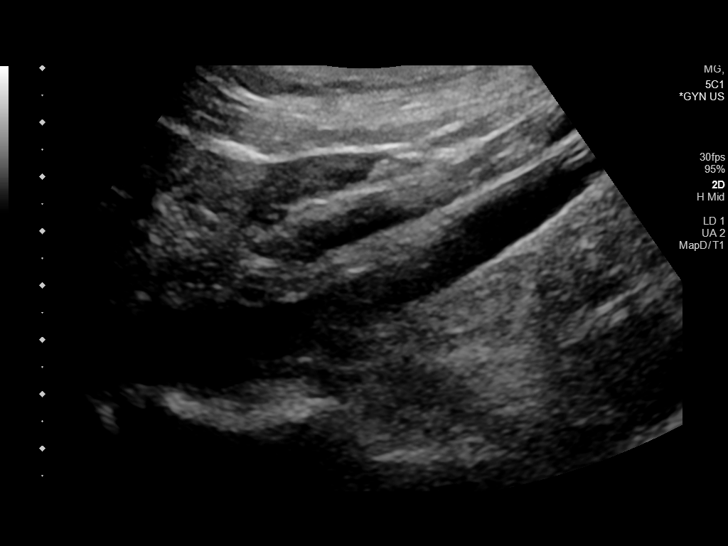
[im 42/56]
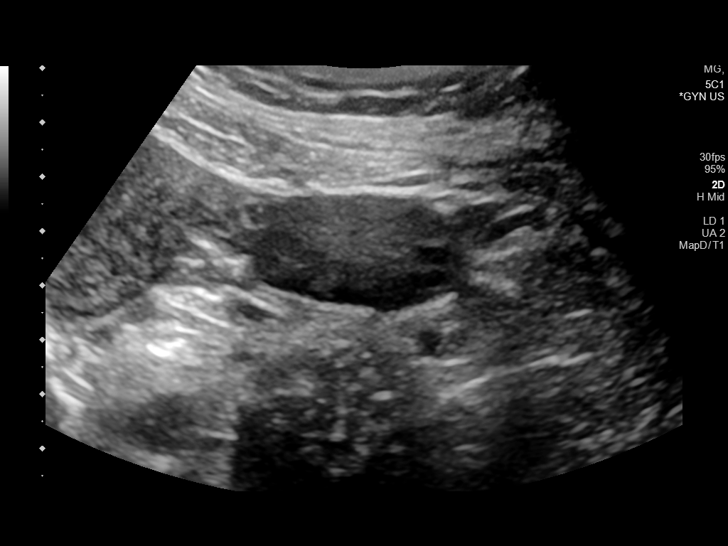
[im 46/56]
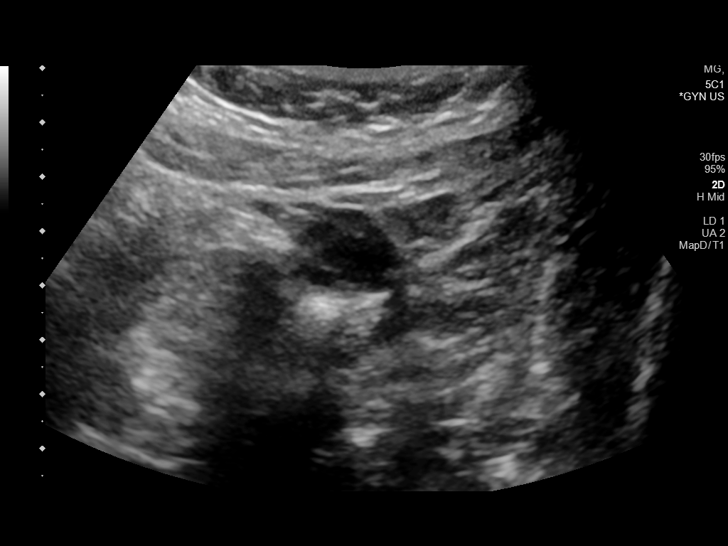
[im 51/56]
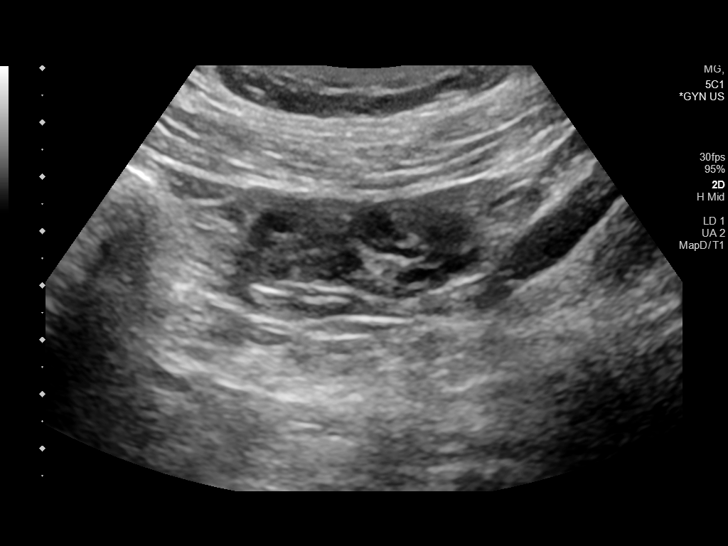
[im 56/56]
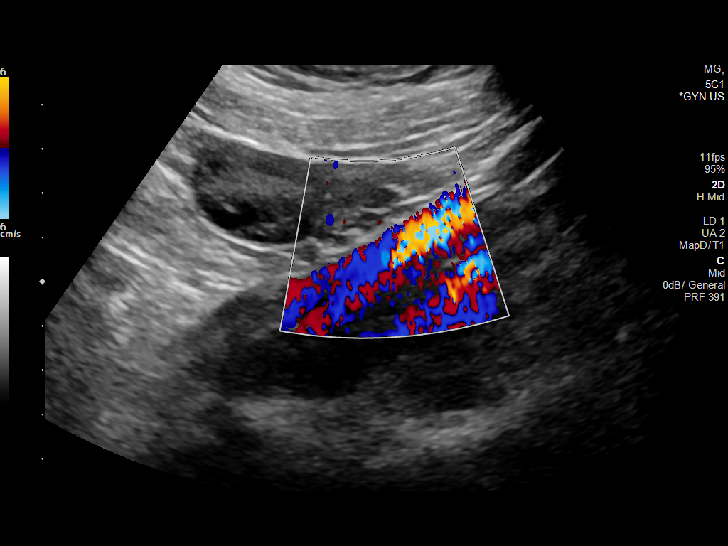

[14 of 25 positions shown; findings below may reference images not displayed]

FINDINGS: Uterus

Measurements: 11.8 x 4.1 x 6.6 cm = volume: 167 mL. No fibroids or
other mass visualized.

Endometrium

Thickness: 5.7 mm.  No focal abnormality visualized.

Right ovary

Measurements: 4.2 x 1.8 x 3.3 cm = volume: 13 mL. Normal
appearance/no adnexal mass.

Left ovary

Measurements: 4.5 x 2.1 x 3.9 cm = volume: 19.7 mL. Normal
appearance/no adnexal mass.

Other findings:  No abnormal free fluid.
IMPRESSION: 1. Normal pelvic ultrasound.

## 2022-08-23 IMAGING — US US TRANSVAGINAL NON-OB
1 series · 13 of 25 positions shown · non-contrast
Comparison: 06/15/2021

CLINICAL DATA: Pain right lower quadrant, vaginal bleeding

EXAM:
TRANSABDOMINAL AND TRANSVAGINAL ULTRASOUND OF PELVIS
DOPPLER ULTRASOUND OF OVARIES
TECHNIQUE: Both transabdominal and transvaginal ultrasound examinations of the
pelvis were performed. Transabdominal technique was performed for
global imaging of the pelvis including uterus, ovaries, adnexal
regions, and pelvic cul-de-sac.
It was necessary to proceed with endovaginal exam following the
transabdominal exam to visualize the endometrium and ovaries. Color
and duplex Doppler ultrasound was utilized to evaluate blood flow to
the ovaries.

[Series 1: us transvaginal non-ob · 13 of 161 slices shown]
[im 1/161]
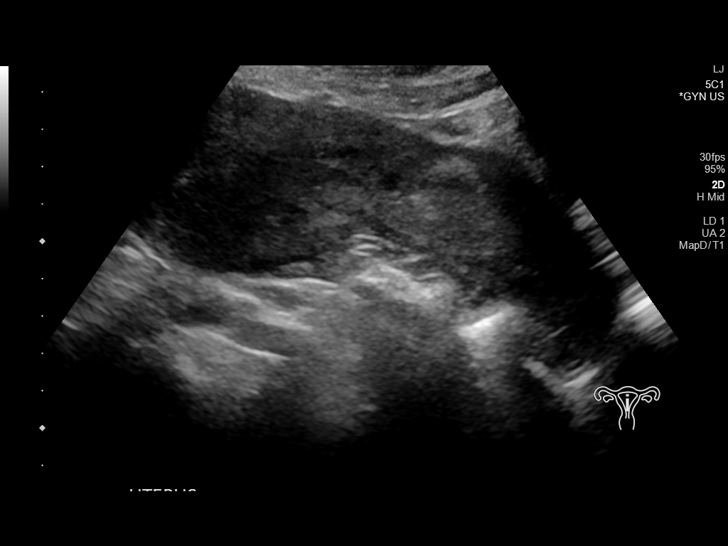
[im 14/161]
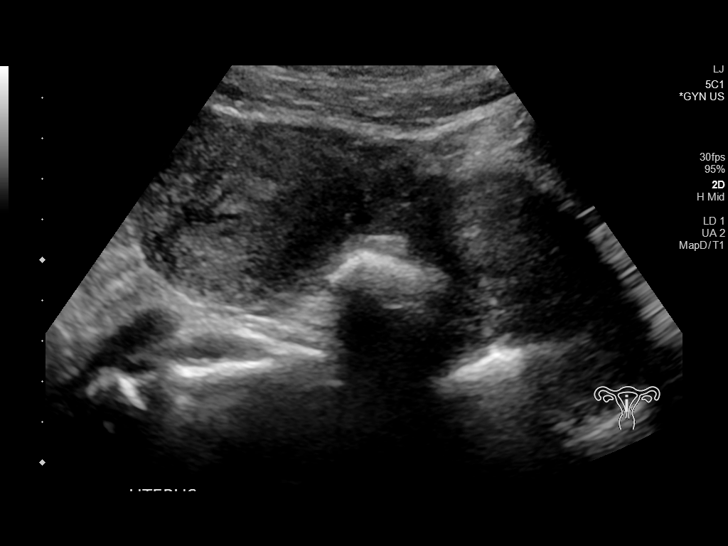
[im 27/161]
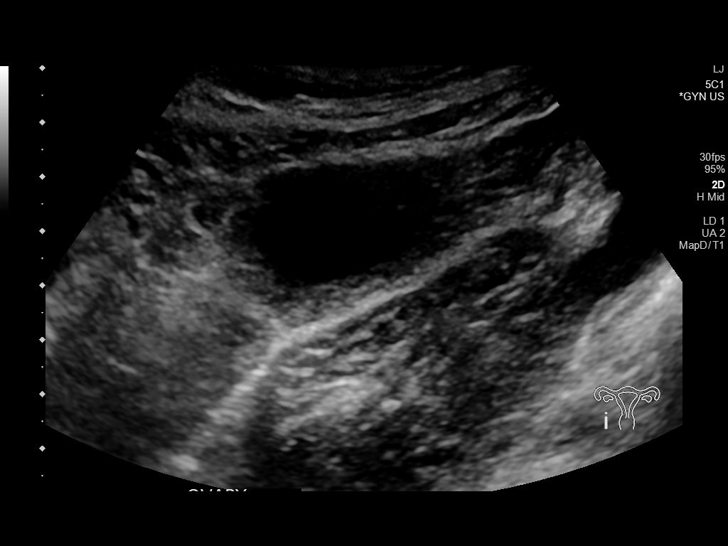
[im 41/161]
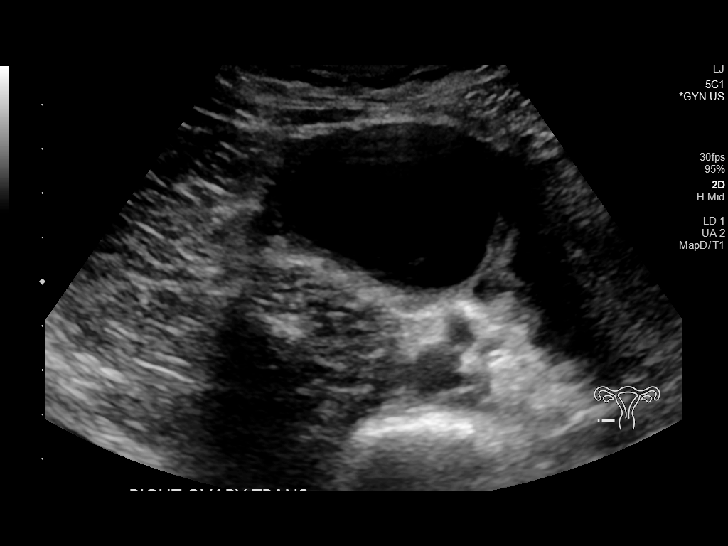
[im 54/161]
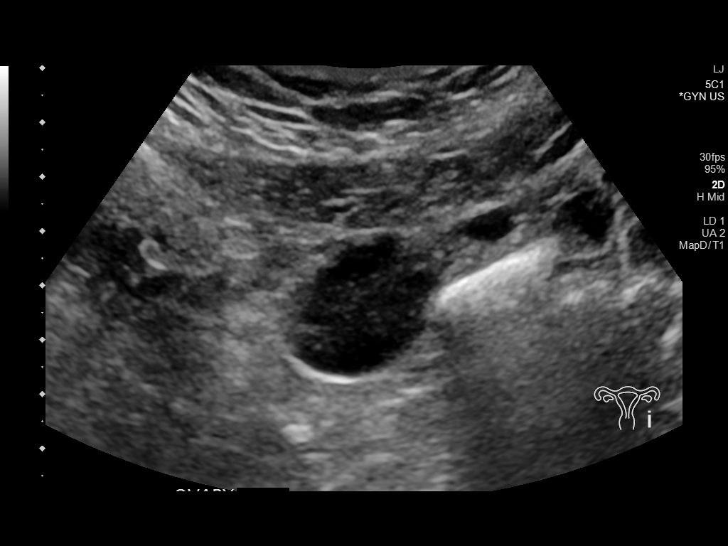
[im 67/161]
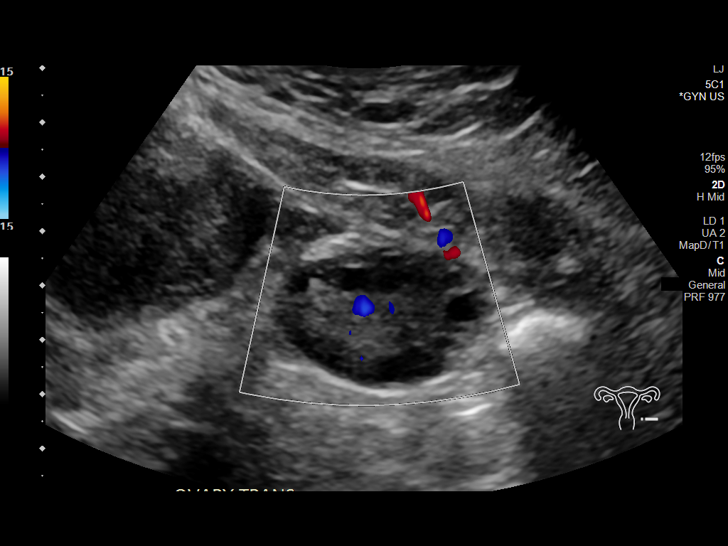
[im 81/161]
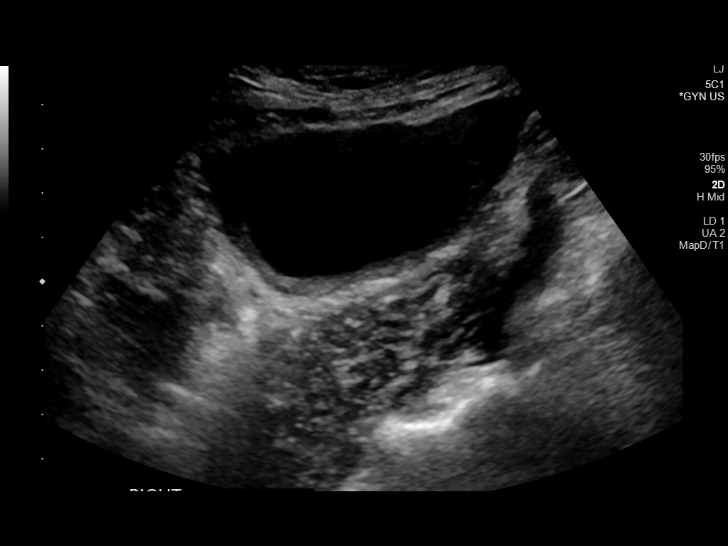
[im 94/161]
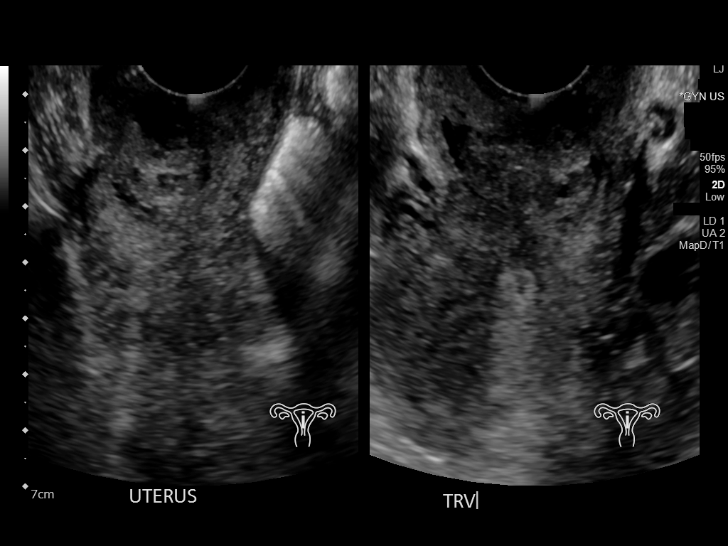
[im 107/161]
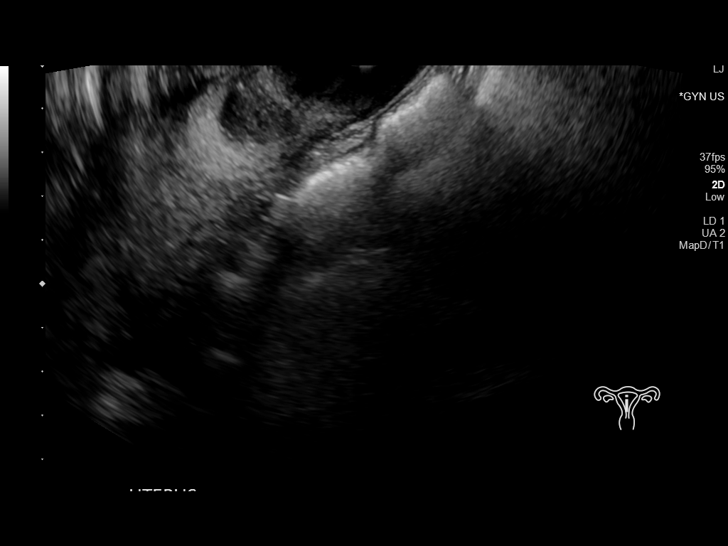
[im 121/161]
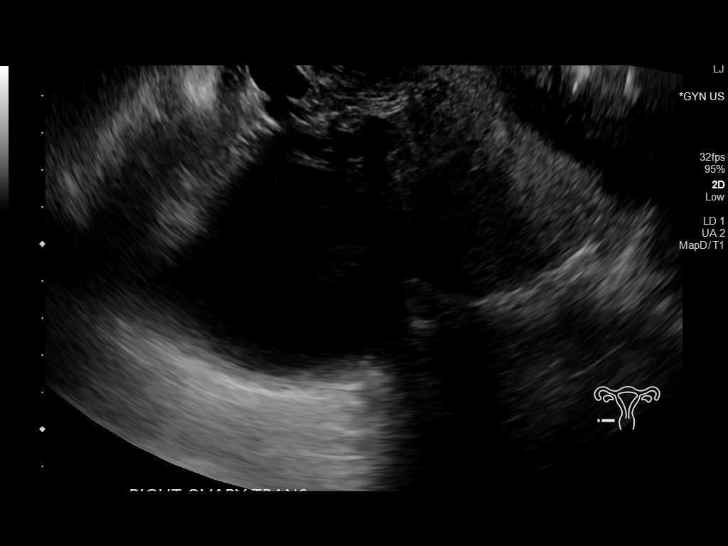
[im 134/161]
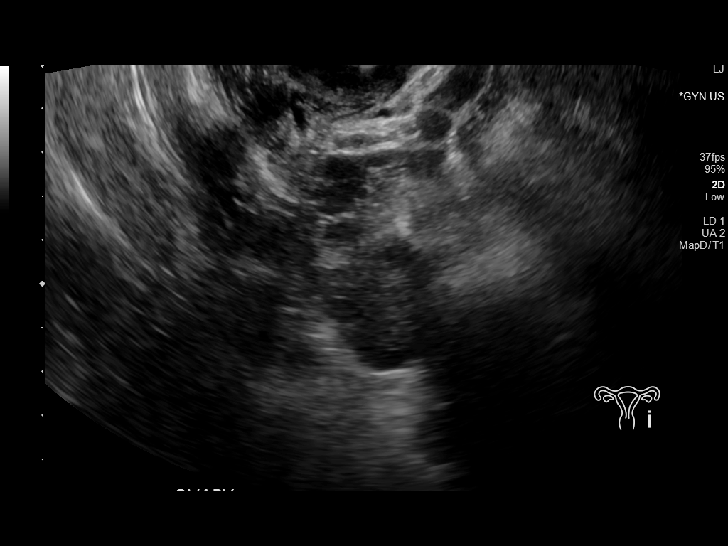
[im 147/161]
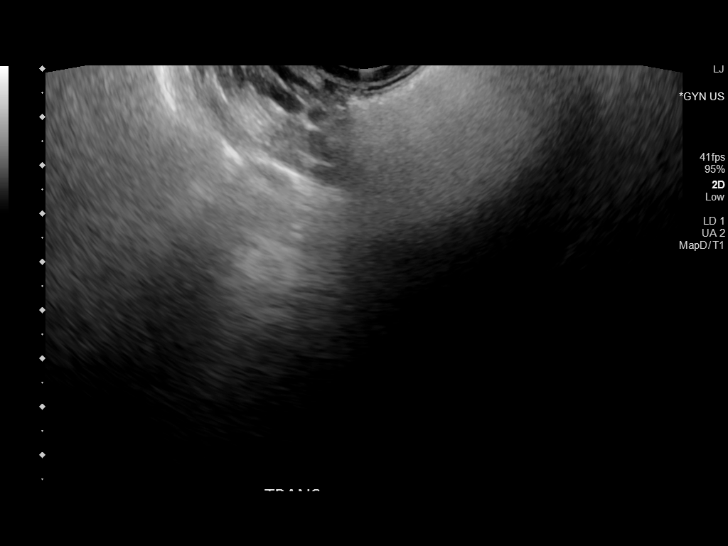
[im 161/161]
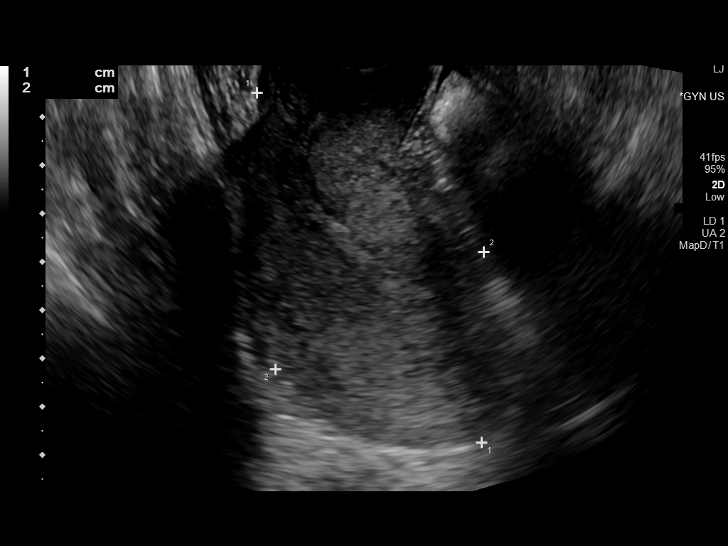

[13 of 25 positions shown; findings below may reference images not displayed]

FINDINGS: Uterus

Measurements: 11.5 x 5.1 x 5.6 cm = volume: 171.1 mL. There is
inhomogeneous echogenicity in myometrium. 2.4 x 2.6 cm area of
inhomogeneous echogenicity is seen in the uterine cervix, possibly
fibroid. Uterus is retroverted.

Endometrium

Thickness: 7 mm.  No focal abnormality visualized.

Right ovary

Measurements: 7.4 x 4.7 x 7 cm = volume: 127.6 mL. There is new
x 5 cm cystic structure with low-level echoes in the right ovary.
There are no thick septations or mural nodules.

Left ovary

Measurements: 3.9 x 2.1 x 3.7 cm = volume: 15.6 mL. Normal
appearance/no adnexal mass.

Color Doppler examination shows demonstrable vascular flow in both
adnexal regions. Pulsed Doppler evaluation of both ovaries
demonstrates normal low-resistance arterial and venous waveforms.

Other findings

No abnormal free fluid.
IMPRESSION: There is inhomogeneous echogenicity in myometrium with possible
cm fibroid in the lower uterine segment.

There is 5.7 cm cyst with low-level internal echoes suggesting
possible hemorrhagic functional cyst in the right ovary.

## 2022-08-23 IMAGING — US US ART/VEN ABD/PELV/SCROTUM DOPPLER LTD
1 series · 13 of 25 positions shown · non-contrast
Comparison: 06/15/2021

CLINICAL DATA: Pain right lower quadrant, vaginal bleeding

EXAM:
TRANSABDOMINAL AND TRANSVAGINAL ULTRASOUND OF PELVIS
DOPPLER ULTRASOUND OF OVARIES
TECHNIQUE: Both transabdominal and transvaginal ultrasound examinations of the
pelvis were performed. Transabdominal technique was performed for
global imaging of the pelvis including uterus, ovaries, adnexal
regions, and pelvic cul-de-sac.
It was necessary to proceed with endovaginal exam following the
transabdominal exam to visualize the endometrium and ovaries. Color
and duplex Doppler ultrasound was utilized to evaluate blood flow to
the ovaries.

[Series 1: us art/ven abd/pelv/scrotum doppler ltd · 13 of 161 slices shown]
[im 1/161]
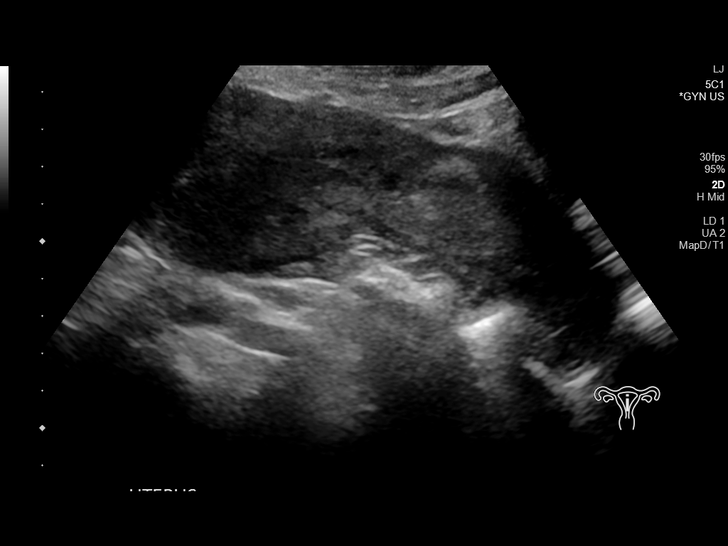
[im 14/161]
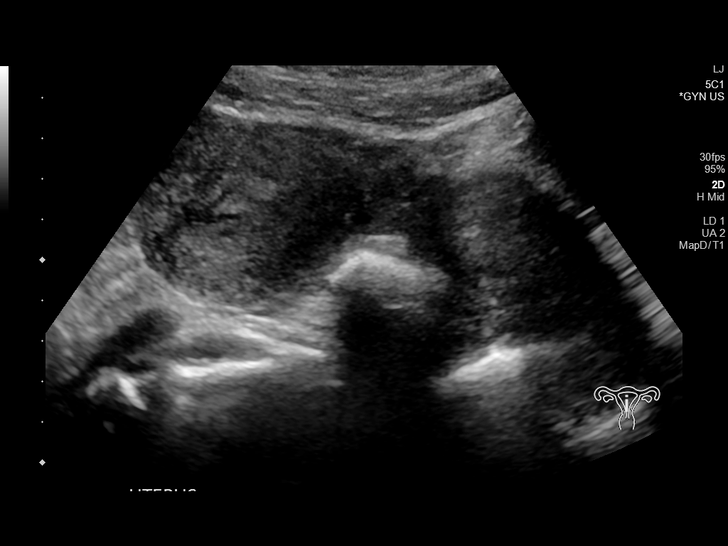
[im 27/161]
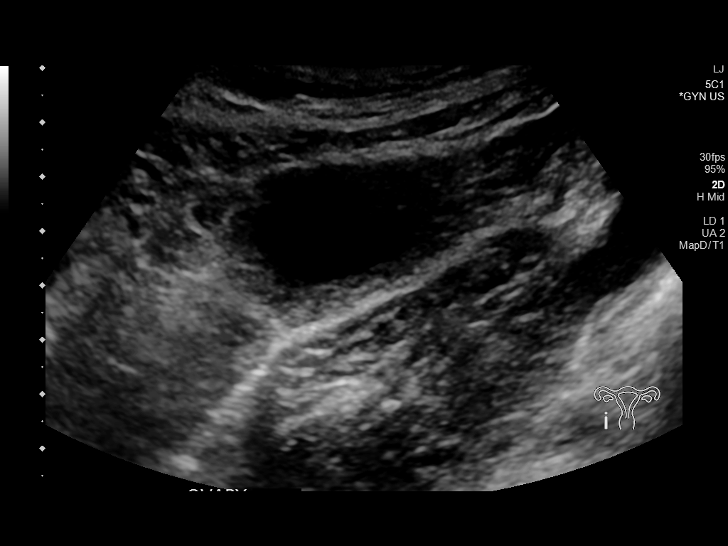
[im 41/161]
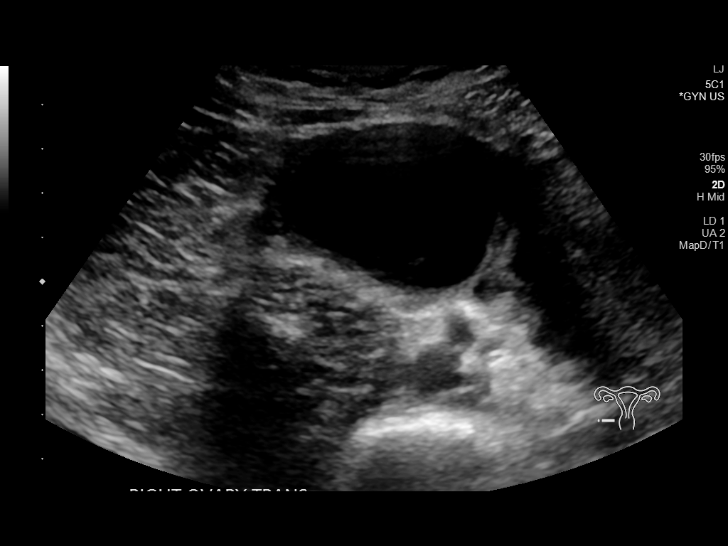
[im 54/161]
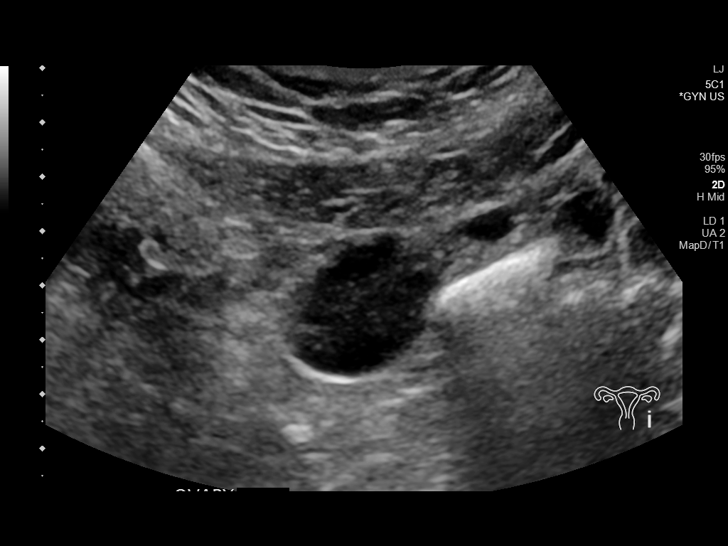
[im 67/161]
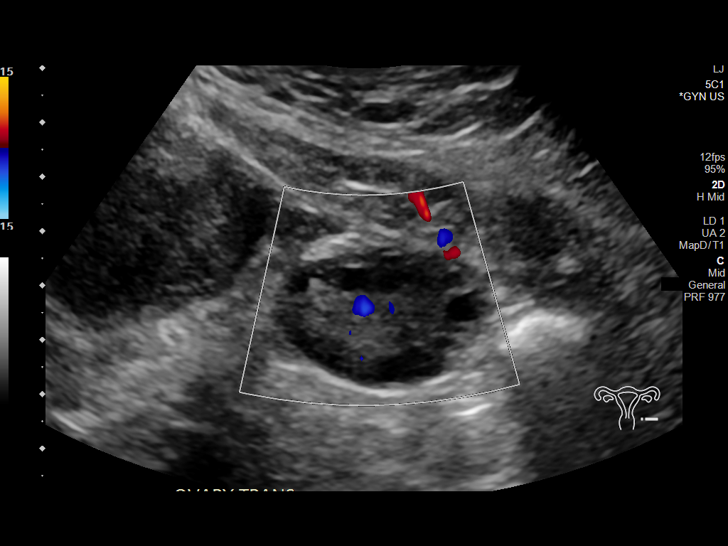
[im 81/161]
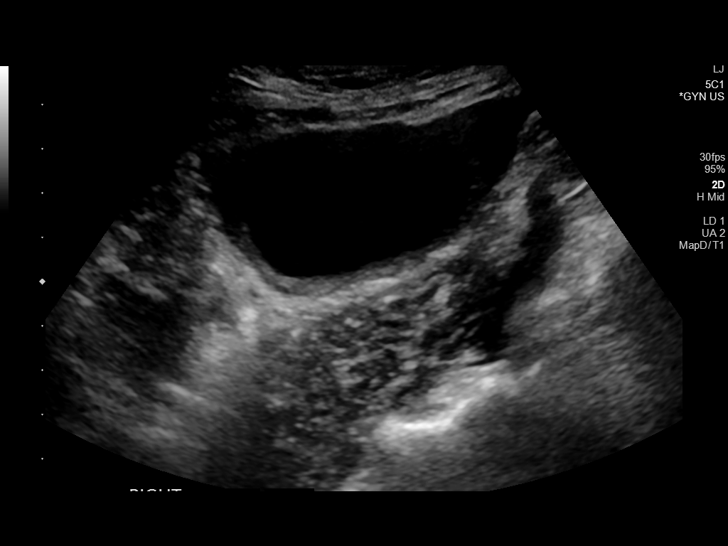
[im 94/161]
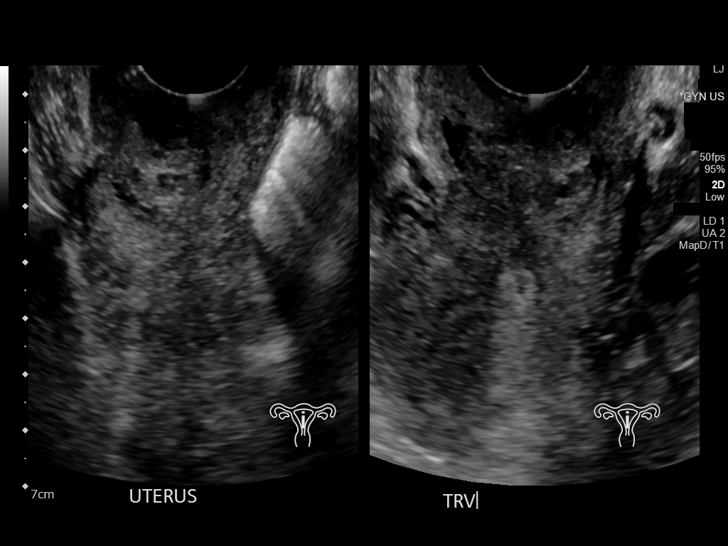
[im 107/161]
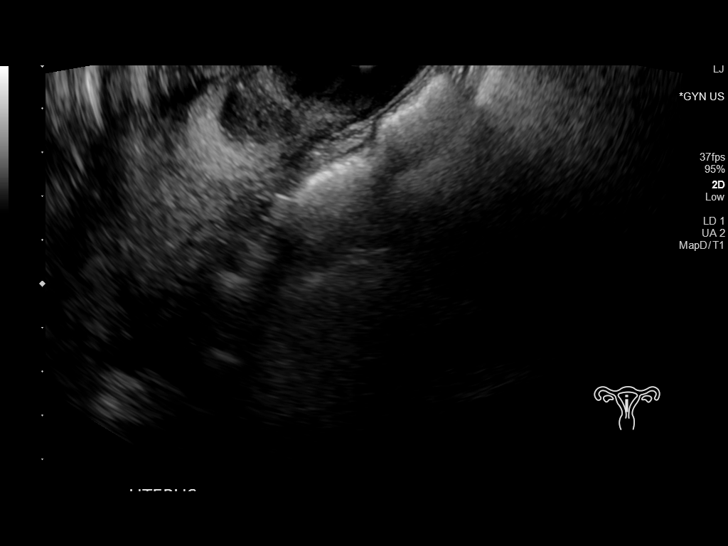
[im 121/161]
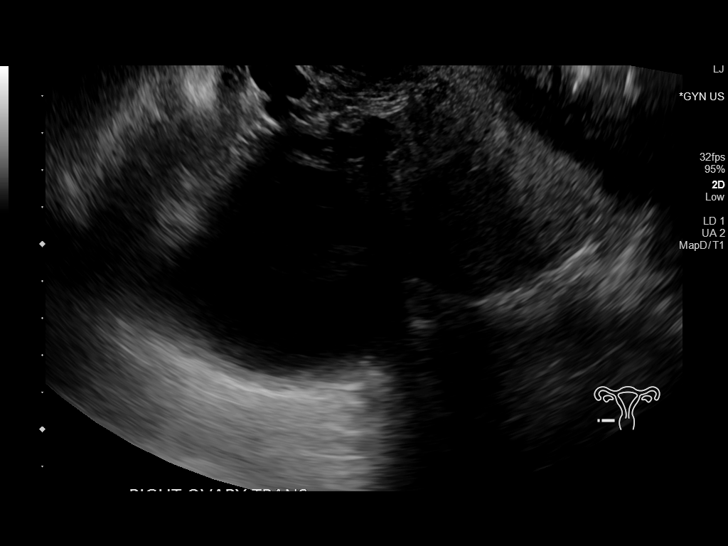
[im 134/161]
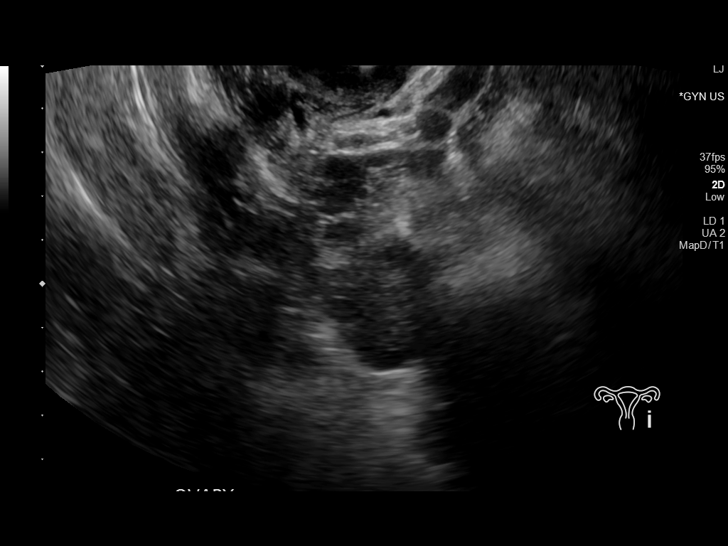
[im 147/161]
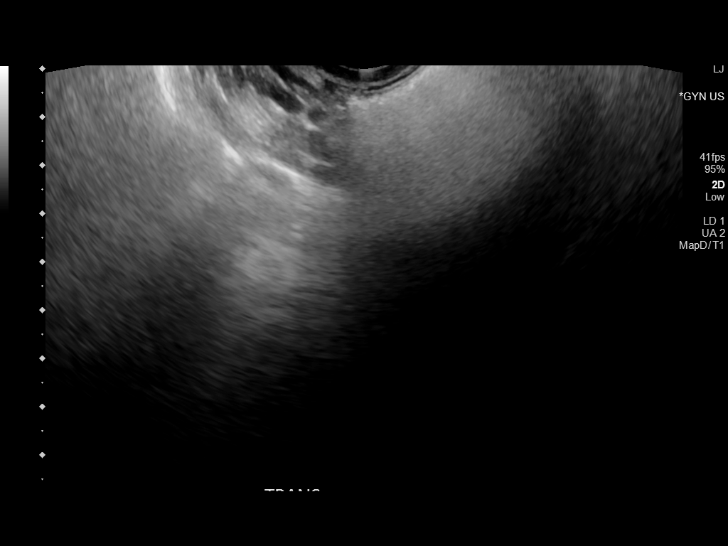
[im 161/161]
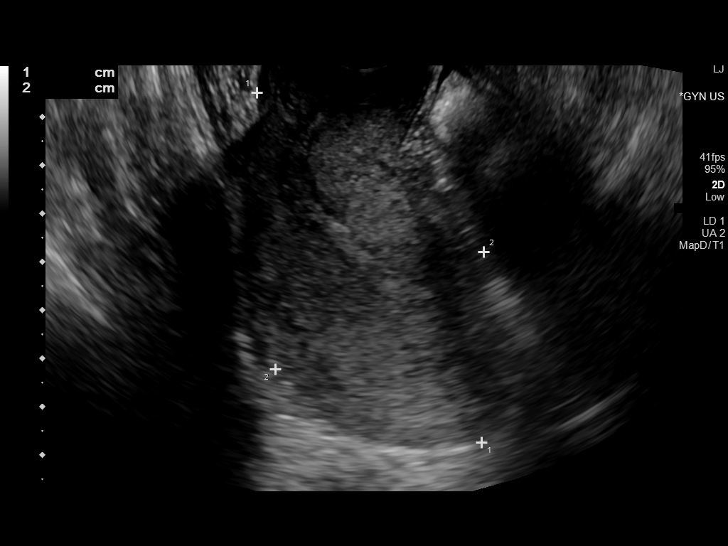

[13 of 25 positions shown; findings below may reference images not displayed]

FINDINGS: Uterus

Measurements: 11.5 x 5.1 x 5.6 cm = volume: 171.1 mL. There is
inhomogeneous echogenicity in myometrium. 2.4 x 2.6 cm area of
inhomogeneous echogenicity is seen in the uterine cervix, possibly
fibroid. Uterus is retroverted.

Endometrium

Thickness: 7 mm.  No focal abnormality visualized.

Right ovary

Measurements: 7.4 x 4.7 x 7 cm = volume: 127.6 mL. There is new
x 5 cm cystic structure with low-level echoes in the right ovary.
There are no thick septations or mural nodules.

Left ovary

Measurements: 3.9 x 2.1 x 3.7 cm = volume: 15.6 mL. Normal
appearance/no adnexal mass.

Color Doppler examination shows demonstrable vascular flow in both
adnexal regions. Pulsed Doppler evaluation of both ovaries
demonstrates normal low-resistance arterial and venous waveforms.

Other findings

No abnormal free fluid.
IMPRESSION: There is inhomogeneous echogenicity in myometrium with possible
cm fibroid in the lower uterine segment.

There is 5.7 cm cyst with low-level internal echoes suggesting
possible hemorrhagic functional cyst in the right ovary.
